# Patient Record
Sex: Male | Born: 1950 | Race: White | Hispanic: No | Marital: Married | Smoking: Former smoker
Health system: Southern US, Community
[De-identification: ages and names within clinical notes are randomized; demographics above are authoritative.]

## PROBLEM LIST (undated history)

## (undated) DIAGNOSIS — I10 Essential (primary) hypertension: Secondary | ICD-10-CM

## (undated) DIAGNOSIS — E785 Hyperlipidemia, unspecified: Secondary | ICD-10-CM

## (undated) DIAGNOSIS — I255 Ischemic cardiomyopathy: Secondary | ICD-10-CM

## (undated) DIAGNOSIS — F419 Anxiety disorder, unspecified: Secondary | ICD-10-CM

## (undated) DIAGNOSIS — M1990 Unspecified osteoarthritis, unspecified site: Secondary | ICD-10-CM

## (undated) DIAGNOSIS — I480 Paroxysmal atrial fibrillation: Secondary | ICD-10-CM

## (undated) DIAGNOSIS — I2510 Atherosclerotic heart disease of native coronary artery without angina pectoris: Secondary | ICD-10-CM

## (undated) DIAGNOSIS — I472 Ventricular tachycardia, unspecified: Secondary | ICD-10-CM

## (undated) DIAGNOSIS — I4510 Unspecified right bundle-branch block: Secondary | ICD-10-CM

## (undated) DIAGNOSIS — I340 Nonrheumatic mitral (valve) insufficiency: Secondary | ICD-10-CM

## (undated) DIAGNOSIS — E46 Unspecified protein-calorie malnutrition: Secondary | ICD-10-CM

## (undated) DIAGNOSIS — S129XXA Fracture of neck, unspecified, initial encounter: Secondary | ICD-10-CM

## (undated) HISTORY — DX: Paroxysmal atrial fibrillation: I48.0

## (undated) HISTORY — DX: Unspecified right bundle-branch block: I45.10

---

## 1998-11-07 DIAGNOSIS — S129XXA Fracture of neck, unspecified, initial encounter: Secondary | ICD-10-CM

## 1998-11-07 HISTORY — PX: CERVICAL FUSION: SHX112

## 1998-11-07 HISTORY — DX: Fracture of neck, unspecified, initial encounter: S12.9XXA

## 1999-05-30 ENCOUNTER — Encounter: Payer: Self-pay | Admitting: Emergency Medicine

## 1999-05-30 ENCOUNTER — Inpatient Hospital Stay (HOSPITAL_COMMUNITY): Admission: EM | Admit: 1999-05-30 | Discharge: 1999-06-01 | Payer: Self-pay

## 1999-05-31 ENCOUNTER — Encounter: Payer: Self-pay | Admitting: General Surgery

## 1999-05-31 ENCOUNTER — Encounter: Payer: Self-pay | Admitting: Surgery

## 1999-06-07 ENCOUNTER — Ambulatory Visit (HOSPITAL_COMMUNITY): Admission: RE | Admit: 1999-06-07 | Discharge: 1999-06-07 | Payer: Self-pay

## 1999-06-11 ENCOUNTER — Emergency Department (HOSPITAL_COMMUNITY): Admission: EM | Admit: 1999-06-11 | Discharge: 1999-06-11 | Payer: Self-pay | Admitting: Emergency Medicine

## 1999-07-04 ENCOUNTER — Ambulatory Visit (HOSPITAL_COMMUNITY): Admission: RE | Admit: 1999-07-04 | Discharge: 1999-07-04 | Payer: Self-pay | Admitting: *Deleted

## 1999-07-04 ENCOUNTER — Encounter: Payer: Self-pay | Admitting: *Deleted

## 1999-07-16 ENCOUNTER — Encounter: Payer: Self-pay | Admitting: Neurological Surgery

## 1999-07-20 ENCOUNTER — Inpatient Hospital Stay (HOSPITAL_COMMUNITY): Admission: RE | Admit: 1999-07-20 | Discharge: 1999-07-21 | Payer: Self-pay | Admitting: Neurological Surgery

## 1999-07-20 ENCOUNTER — Encounter: Payer: Self-pay | Admitting: Neurological Surgery

## 1999-08-25 ENCOUNTER — Encounter: Payer: Self-pay | Admitting: Neurological Surgery

## 1999-08-25 ENCOUNTER — Encounter: Admission: RE | Admit: 1999-08-25 | Discharge: 1999-08-25 | Payer: Self-pay | Admitting: Neurological Surgery

## 1999-09-24 ENCOUNTER — Encounter: Admission: RE | Admit: 1999-09-24 | Discharge: 1999-09-24 | Payer: Self-pay | Admitting: Neurological Surgery

## 1999-09-24 ENCOUNTER — Encounter: Payer: Self-pay | Admitting: Neurological Surgery

## 2008-04-07 ENCOUNTER — Encounter (INDEPENDENT_AMBULATORY_CARE_PROVIDER_SITE_OTHER): Payer: Self-pay | Admitting: Urology

## 2008-04-07 ENCOUNTER — Ambulatory Visit (HOSPITAL_BASED_OUTPATIENT_CLINIC_OR_DEPARTMENT_OTHER): Admission: RE | Admit: 2008-04-07 | Discharge: 2008-04-07 | Payer: Self-pay | Admitting: Urology

## 2011-03-22 NOTE — Op Note (Signed)
Gary [name], Gary [name]                  ACCOUNT NO.:  [id number]   MEDICAL RECORD NO.:  [id number]          PATIENT TYPE:  AMB   LOCATION:  [facility]                         FACILITY:  [facility]   PHYSICIAN:  Valetta Fuller, M.D.  DATE OF BIRTH:  [birth year]   DATE OF PROCEDURE:  04/07/2008  DATE OF DISCHARGE:                               OPERATIVE REPORT   PREOPERATIVE DIAGNOSIS:  Elevated PSA.   POSTOPERATIVE DIAGNOSIS:  Elevated PSA.   PROCEDURE PERFORMED:  Transrectal ultrasound of the prostate with  ultrasound-guided biopsy.   SURGEON:  Valetta Fuller, M.D.   ANESTHESIA:  MAC.   INDICATIONS:  [name] was sent to me recently because of elevated PSA  of 7.7.  He is 60 years of age.  Apparently a PSA last year was within  normal limits.  On clinical exam, the patient was felt to have a normal  prostate based on digital rectal exam, which was 2+ in size.  There was  no evidence of prostatitis or other obvious explanation for his elevated  PSA.  We strongly recommended consideration for ultrasound and biopsy  given his age and change in his PSA.  The patient initially refused the  procedure.  We discussed things with  him further and he did agree to  have the procedure done if it could be done with additional sedation.  The patient appears to understand the rationale for this, the  advantages, disadvantages and potential complications.  He has done the  preparatory steps, has taken oral antibiotics and has now received  perioperative gentamicin.   TECHNIQUE AND FINDINGS:  The patient was brought to the operating room.  He was placed in the lateral decubitus position.  IV sedation was  administered by anesthesia services.  Transrectal ultrasound was placed.  Representative sagittal and transverse images of the prostate were  taken.  Prostate volume was estimated at 30 grams.  There were no  obvious hypoechoic areas but there were some intraprostatic  calcifications.  Prostate was  otherwise symmetric and seminal vesicles  were unremarkable.  The periprostatic block was performed utilizing  lidocaine and a spinal needle.  We then did 12 biopsies with 2 biopsies  in each of the 6 quadrants which were sent in 6 separate vials.  This  was well tolerated by the patient.  There are no obvious complications  or problems.  He was brought to recovery room in stable condition.           ______________________________  Valetta Fuller, M.D.  Electronically Signed     DSG/MEDQ  D:  04/07/2008  T:  04/07/2008  Job:  [id number]

## 2011-08-04 LAB — POCT I-STAT 4, (NA,K, GLUC, HGB,HCT)
Glucose, Bld: 103 — ABNORMAL HIGH
HCT: 48
Hemoglobin: 16.3
Operator id: 268271
Potassium: 4.3
Sodium: 141

## 2013-09-07 HISTORY — PX: CORONARY ANGIOPLASTY: SHX604

## 2013-10-05 ENCOUNTER — Inpatient Hospital Stay (HOSPITAL_COMMUNITY)
Admission: EM | Admit: 2013-10-05 | Discharge: 2013-10-08 | DRG: 250 | Disposition: A | Discharge status: Home / Self Care | Payer: BC Managed Care – PPO | Attending: Cardiology | Admitting: Cardiology

## 2013-10-05 ENCOUNTER — Inpatient Hospital Stay (HOSPITAL_COMMUNITY): Payer: BC Managed Care – PPO

## 2013-10-05 ENCOUNTER — Encounter (HOSPITAL_COMMUNITY): Payer: Self-pay | Admitting: Emergency Medicine

## 2013-10-05 DIAGNOSIS — E876 Hypokalemia: Secondary | ICD-10-CM | POA: Diagnosis not present

## 2013-10-05 DIAGNOSIS — I214 Non-ST elevation (NSTEMI) myocardial infarction: Secondary | ICD-10-CM

## 2013-10-05 DIAGNOSIS — Z681 Body mass index (BMI) 19 or less, adult: Secondary | ICD-10-CM

## 2013-10-05 DIAGNOSIS — F411 Generalized anxiety disorder: Secondary | ICD-10-CM | POA: Diagnosis present

## 2013-10-05 DIAGNOSIS — I059 Rheumatic mitral valve disease, unspecified: Secondary | ICD-10-CM | POA: Diagnosis present

## 2013-10-05 DIAGNOSIS — I2542 Coronary artery dissection: Secondary | ICD-10-CM | POA: Diagnosis present

## 2013-10-05 DIAGNOSIS — I2510 Atherosclerotic heart disease of native coronary artery without angina pectoris: Secondary | ICD-10-CM | POA: Diagnosis present

## 2013-10-05 DIAGNOSIS — R079 Chest pain, unspecified: Secondary | ICD-10-CM

## 2013-10-05 DIAGNOSIS — I472 Ventricular tachycardia, unspecified: Secondary | ICD-10-CM | POA: Diagnosis present

## 2013-10-05 DIAGNOSIS — I2589 Other forms of chronic ischemic heart disease: Secondary | ICD-10-CM | POA: Diagnosis present

## 2013-10-05 DIAGNOSIS — Z23 Encounter for immunization: Secondary | ICD-10-CM

## 2013-10-05 DIAGNOSIS — Z9861 Coronary angioplasty status: Secondary | ICD-10-CM

## 2013-10-05 DIAGNOSIS — E785 Hyperlipidemia, unspecified: Secondary | ICD-10-CM | POA: Diagnosis present

## 2013-10-05 DIAGNOSIS — F17200 Nicotine dependence, unspecified, uncomplicated: Secondary | ICD-10-CM | POA: Diagnosis present

## 2013-10-05 DIAGNOSIS — I10 Essential (primary) hypertension: Secondary | ICD-10-CM | POA: Diagnosis present

## 2013-10-05 DIAGNOSIS — I4729 Other ventricular tachycardia: Secondary | ICD-10-CM | POA: Diagnosis present

## 2013-10-05 DIAGNOSIS — E46 Unspecified protein-calorie malnutrition: Secondary | ICD-10-CM | POA: Diagnosis present

## 2013-10-05 DIAGNOSIS — I252 Old myocardial infarction: Secondary | ICD-10-CM | POA: Diagnosis present

## 2013-10-05 DIAGNOSIS — E43 Unspecified severe protein-calorie malnutrition: Secondary | ICD-10-CM | POA: Insufficient documentation

## 2013-10-05 HISTORY — DX: Anxiety disorder, unspecified: F41.9

## 2013-10-05 HISTORY — DX: Ventricular tachycardia: I47.2

## 2013-10-05 HISTORY — DX: Ischemic cardiomyopathy: I25.5

## 2013-10-05 HISTORY — DX: Ventricular tachycardia, unspecified: I47.20

## 2013-10-05 HISTORY — DX: Fracture of neck, unspecified, initial encounter: S12.9XXA

## 2013-10-05 HISTORY — DX: Hyperlipidemia, unspecified: E78.5

## 2013-10-05 HISTORY — DX: Unspecified protein-calorie malnutrition: E46

## 2013-10-05 HISTORY — DX: Nonrheumatic mitral (valve) insufficiency: I34.0

## 2013-10-05 HISTORY — DX: Atherosclerotic heart disease of native coronary artery without angina pectoris: I25.10

## 2013-10-05 HISTORY — DX: Essential (primary) hypertension: I10

## 2013-10-05 LAB — COMPREHENSIVE METABOLIC PANEL
ALT: 19 U/L (ref 0–53)
Alkaline Phosphatase: 73 U/L (ref 39–117)
BUN: 15 mg/dL (ref 6–23)
CO2: 27 mEq/L (ref 19–32)
Calcium: 9.2 mg/dL (ref 8.4–10.5)
Chloride: 103 mEq/L (ref 96–112)
GFR calc Af Amer: >90 mL/min (ref >90)
GFR calc non Af Amer: >90 mL/min (ref >90)
Glucose, Bld: 114 mg/dL — ABNORMAL HIGH (ref 70–99)
Potassium: 3.1 mEq/L — ABNORMAL LOW (ref 3.5–5.1)
Sodium: 140 mEq/L (ref 135–145)
Total Bilirubin: 0.3 mg/dL (ref 0.3–1.2)

## 2013-10-05 LAB — CBC
HCT: 41.7 % (ref 39.0–52.0)
Hemoglobin: 14.3 g/dL (ref 13.0–17.0)
Platelets: 224 10*3/uL (ref 150–400)
RDW: 14 % (ref 11.5–15.5)
WBC: 13.4 10*3/uL — ABNORMAL HIGH (ref 4.0–10.5)

## 2013-10-05 LAB — BASIC METABOLIC PANEL
CO2: 26 mEq/L (ref 19–32)
Chloride: 102 mEq/L (ref 96–112)
Creatinine, Ser: 0.73 mg/dL (ref 0.50–1.35)
GFR calc Af Amer: >90 mL/min (ref >90)
Potassium: 3.2 mEq/L — ABNORMAL LOW (ref 3.5–5.1)
Sodium: 140 mEq/L (ref 135–145)

## 2013-10-05 LAB — POCT I-STAT TROPONIN I: Troponin i, poc: 6.09 ng/mL (ref 0.00–0.08)

## 2013-10-05 MED ORDER — PROMETHAZINE HCL 25 MG PO TABS: 12.5000 mg | ORAL_TABLET | Freq: Four times a day (QID) | ORAL | Status: DC | PRN

## 2013-10-05 MED ORDER — ONDANSETRON HCL 4 MG/2ML IJ SOLN: 4.0000 mg | Freq: Four times a day (QID) | INTRAMUSCULAR | Status: DC | PRN

## 2013-10-05 MED ORDER — ASPIRIN EC 325 MG PO TBEC
325.0000 mg | DELAYED_RELEASE_TABLET | Freq: Once | ORAL | Status: DC
  Filled 2013-10-05: qty 1

## 2013-10-05 MED ORDER — HEPARIN BOLUS VIA INFUSION
3000.0000 [IU] | Freq: Once | INTRAVENOUS | Status: AC
  Administered 2013-10-05: 3000 [IU] via INTRAVENOUS
  Filled 2013-10-05: qty 3000

## 2013-10-05 MED ORDER — LORAZEPAM 2 MG/ML IJ SOLN
1.0000 mg | Freq: Once | INTRAMUSCULAR | Status: AC
  Administered 2013-10-05: 1 mg via INTRAVENOUS
  Filled 2013-10-05: qty 1

## 2013-10-05 MED ORDER — PNEUMOCOCCAL VAC POLYVALENT 25 MCG/0.5ML IJ INJ
0.5000 mL | INJECTION | INTRAMUSCULAR | Status: DC
  Filled 2013-10-05: qty 0.5

## 2013-10-05 MED ORDER — AMIODARONE IV BOLUS ONLY 150 MG/100ML
150.0000 mg | Freq: Once | INTRAVENOUS | Status: DC
  Filled 2013-10-05: qty 100

## 2013-10-05 MED ORDER — EZETIMIBE 10 MG PO TABS
10.0000 mg | ORAL_TABLET | Freq: Every day | ORAL | Status: DC
  Administered 2013-10-05 – 2013-10-08 (×4): 10 mg via ORAL
  Filled 2013-10-05 (×4): qty 1

## 2013-10-05 MED ORDER — AMLODIPINE-OLMESARTAN 10-20 MG PO TABS: 1.0000 | ORAL_TABLET | Freq: Every day | ORAL | Status: DC

## 2013-10-05 MED ORDER — PROMETHAZINE HCL 25 MG RE SUPP: 12.5000 mg | Freq: Four times a day (QID) | RECTAL | Status: DC | PRN

## 2013-10-05 MED ORDER — DEXTROSE 5 % IV SOLN
60.0000 mg/h | Freq: Once | INTRAVENOUS | Status: DC
  Filled 2013-10-05: qty 9

## 2013-10-05 MED ORDER — AMIODARONE HCL IN DEXTROSE 360-4.14 MG/200ML-% IV SOLN
60.0000 mg/h | INTRAVENOUS | Status: DC
  Administered 2013-10-05: 60 mg/h via INTRAVENOUS
  Administered 2013-10-06 (×2): 30 mg/h via INTRAVENOUS
  Filled 2013-10-05 (×10): qty 200

## 2013-10-05 MED ORDER — ACETAMINOPHEN 325 MG PO TABS: 650.0000 mg | ORAL_TABLET | Freq: Four times a day (QID) | ORAL | Status: DC | PRN

## 2013-10-05 MED ORDER — PROMETHAZINE HCL 25 MG/ML IJ SOLN: 12.5000 mg | Freq: Four times a day (QID) | INTRAMUSCULAR | Status: DC | PRN

## 2013-10-05 MED ORDER — HEPARIN SODIUM (PORCINE) 5000 UNIT/ML IJ SOLN: 60.0000 [IU]/kg | Freq: Once | INTRAMUSCULAR | Status: DC

## 2013-10-05 MED ORDER — MORPHINE SULFATE 2 MG/ML IJ SOLN: 2.0000 mg | INTRAMUSCULAR | Status: DC | PRN

## 2013-10-05 MED ORDER — IRBESARTAN 150 MG PO TABS
150.0000 mg | ORAL_TABLET | Freq: Every day | ORAL | Status: DC
  Administered 2013-10-05 – 2013-10-07 (×3): 150 mg via ORAL
  Filled 2013-10-05 (×5): qty 1

## 2013-10-05 MED ORDER — ACETAMINOPHEN 500 MG PO TABS: 500.0000 mg | ORAL_TABLET | Freq: Four times a day (QID) | ORAL | Status: DC | PRN

## 2013-10-05 MED ORDER — ALPRAZOLAM 0.25 MG PO TABS
0.2500 mg | ORAL_TABLET | Freq: Every day | ORAL | Status: DC | PRN
  Administered 2013-10-06: 0.25 mg via ORAL
  Filled 2013-10-05: qty 1

## 2013-10-05 MED ORDER — ASPIRIN EC 81 MG PO TBEC
81.0000 mg | DELAYED_RELEASE_TABLET | Freq: Every day | ORAL | Status: DC
  Administered 2013-10-06: 81 mg via ORAL
  Filled 2013-10-05: qty 1

## 2013-10-05 MED ORDER — ACETAMINOPHEN 325 MG PO TABS: 650.0000 mg | ORAL_TABLET | ORAL | Status: DC | PRN

## 2013-10-05 MED ORDER — METOPROLOL TARTRATE 12.5 MG HALF TABLET
12.5000 mg | ORAL_TABLET | Freq: Two times a day (BID) | ORAL | Status: DC
  Administered 2013-10-05 – 2013-10-08 (×6): 12.5 mg via ORAL
  Filled 2013-10-05 (×8): qty 1

## 2013-10-05 MED ORDER — HEPARIN (PORCINE) IN NACL 100-0.45 UNIT/ML-% IJ SOLN
850.0000 [IU]/h | INTRAMUSCULAR | Status: DC
  Administered 2013-10-05: 700 [IU]/h via INTRAVENOUS
  Administered 2013-10-06 (×2): 850 [IU]/h via INTRAVENOUS
  Filled 2013-10-05 (×3): qty 250

## 2013-10-05 MED ORDER — ASPIRIN 81 MG PO CHEW
324.0000 mg | CHEWABLE_TABLET | Freq: Once | ORAL | Status: AC
  Administered 2013-10-05: 324 mg via ORAL
  Filled 2013-10-05: qty 4

## 2013-10-05 MED ORDER — NITROGLYCERIN 0.4 MG SL SUBL: 0.4000 mg | SUBLINGUAL_TABLET | SUBLINGUAL | Status: DC | PRN

## 2013-10-05 MED ORDER — AMLODIPINE BESYLATE 10 MG PO TABS
10.0000 mg | ORAL_TABLET | Freq: Every day | ORAL | Status: DC
  Administered 2013-10-05: 10 mg via ORAL
  Filled 2013-10-05 (×2): qty 1

## 2013-10-05 MED ORDER — AMIODARONE IV BOLUS ONLY 150 MG/100ML
150.0000 mg | Freq: Once | INTRAVENOUS | Status: AC
  Administered 2013-10-05: 150 mg via INTRAVENOUS

## 2013-10-05 NOTE — Progress Notes (Signed)
  Amiodarone Drug - Drug Interaction Consult Note  Recommendations: No major drug interactions identified.  Will continue to monitor. Amiodarone is metabolized by the cytochrome P450 system and therefore has the potential to cause many drug interactions. Amiodarone has an average plasma half-life of 50 days (range 20 to 100 days).   There is potential for drug interactions to occur several weeks or months after stopping treatment and the onset of drug interactions may be slow after initiating amiodarone.   []  Statins: Increased risk of myopathy. Simvastatin- restrict dose to 20mg  daily. Other statins: counsel patients to report any muscle pain or weakness immediately.  []  Anticoagulants: Amiodarone can increase anticoagulant effect. Consider warfarin dose reduction. Patients should be monitored closely and the dose of anticoagulant altered accordingly, remembering that amiodarone levels take several weeks to stabilize.  []  Antiepileptics: Amiodarone can increase plasma concentration of phenytoin, the dose should be reduced. Note that small changes in phenytoin dose can result in large changes in levels. Monitor patient and counsel on signs of toxicity.  [x]  Beta blockers: increased risk of bradycardia, AV block and myocardial depression. Sotalol - avoid concomitant use.  []   Calcium channel blockers (diltiazem and verapamil): increased risk of bradycardia, AV block and myocardial depression.  []   Cyclosporine: Amiodarone increases levels of cyclosporine. Reduced dose of cyclosporine is recommended.  []  Digoxin dose should be halved when amiodarone is started.  []  Diuretics: increased risk of cardiotoxicity if hypokalemia occurs.  []  Oral hypoglycemic agents (glyburide, glipizide, glimepiride): increased risk of hypoglycemia. Patient's glucose levels should be monitored closely when initiating amiodarone therapy.   []  Drugs that prolong the QT interval:  Torsades de pointes risk may be  increased with concurrent use - avoid if possible.  Monitor QTc, also keep magnesium/potassium WNL if concurrent therapy can't be avoided. Marland Kitchen Antibiotics: e.g. fluoroquinolones, erythromycin. . Antiarrhythmics: e.g. quinidine, procainamide, disopyramide, sotalol. . Antipsychotics: e.g. phenothiazines, haloperidol.  . Lithium, tricyclic antidepressants, and methadone. Thank You,  Sallee Provencal  10/05/2013 9:36 PM

## 2013-10-05 NOTE — ED Provider Notes (Signed)
I saw and evaluated the patient, reviewed the resident's note and I agree with the findings and plan.  EKG Interpretation    Date/Time:  Saturday October 05 2013 17:13:29 EST Ventricular Rate:  64 PR Interval:  65 QRS Duration: 105 QT Interval:  451 QTC Calculation: 465 R Axis:   -94 Text Interpretation:  Sinus rhythm Short PR interval Right superior axis Abnormal lateral Q waves Anteroseptal infarct, old Borderline ST depression, lateral leads, similar to prior Baseline wander in lead(s) V3 V4 Confirmed by Gwendolyn Grant  MD, Lilliam Chamblee ([id number]) on 10/05/2013 5:48:08 PM            Patient here with chest pain. Atypical, burning sensation, intermittent. Relieved with Pepto, then upon return relieved with ASA. Here with normal initial EKG. While awaiting labs, nursing noted patient was having runs of V-tach. I witnessed a 30 second episode of ventricular tachycardia myself. Patient had pulse entire time and wasn't complaining of anything.  Amiodarone initiated. Patient's troponin returned at 6, heparin initiated, Cards admitting.  CRITICAL CARE Performed by: Dagmar Hait   Total critical care time: 30 minutes  Critical care time was exclusive of separately billable procedures and treating other patients.  Critical care was necessary to treat or prevent imminent or life-threatening deterioration.  Critical care was time spent personally by me on the following activities: development of treatment plan with patient and/or surrogate as well as nursing, discussions with consultants, evaluation of patient's response to treatment, examination of patient, obtaining history from patient or surrogate, ordering and performing treatments and interventions, ordering and review of laboratory studies, ordering and review of radiographic studies, pulse oximetry and re-evaluation of patient's condition.   1. NSTEMI (non-ST elevated myocardial infarction)  2. V-tach.    Dagmar Hait,  MD 10/05/13 [phone]

## 2013-10-05 NOTE — ED Provider Notes (Signed)
CSN: [id number]     Arrival date & time 10/05/13  1710 History   First MD Initiated Contact with Patient 10/05/13 1725     Chief Complaint  Patient presents with  . Chest Pain   (Consider location/radiation/quality/duration/timing/severity/associated sxs/prior Treatment) HPI Comments: 62 year old male chest pain. Some chest pains approximately 11 AM. Patient states his pain was there for several hours. Took Pepto-Bismol in the afternoon with complete resolution of symptoms. Is now symptom-free. Denies any shortness of breath, diaphoresis or previous heart issues.  Patient is a 62 y.o. male presenting with chest pain.  Chest Pain Pain location:  Substernal area Pain quality: burning   Pain radiates to:  Does not radiate Pain radiates to the back: no   Pain severity:  Mild Onset quality:  Unable to specify Timing:  Constant Progression:  Resolved Chronicity:  New Context comment:  None noted Relieved by:  Antacids Worsened by:  Nothing tried Associated symptoms: no abdominal pain, no dizziness, no fatigue, no headache and no shortness of breath   Risk factors: high cholesterol, hypertension and smoking     Past Medical History  Diagnosis Date  . Hypertension   . Dyslipidemia   . Anxiety   . Broken neck    History reviewed. No pertinent past surgical history. History reviewed. No pertinent family history. History  Substance Use Topics  . Smoking status: Current Every Day Smoker    Types: Cigarettes  . Smokeless tobacco: Not on file  . Alcohol Use: No    Review of Systems  Constitutional: Negative for fatigue.  Respiratory: Negative for shortness of breath.   Cardiovascular: Positive for chest pain.  Gastrointestinal: Negative for abdominal pain.  Genitourinary: Negative for difficulty urinating.  Neurological: Negative for dizziness and headaches.  Psychiatric/Behavioral: Negative for agitation.  All other systems reviewed and are negative.    Allergies   Morphine and related and Statins  Home Medications   Current Outpatient Rx  Name  Route  Sig  Dispense  Refill  . acetaminophen (TYLENOL) 500 MG tablet   Oral   Take 500 mg by mouth every 6 (six) hours as needed for moderate pain.         Marland Kitchen ALPRAZolam (XANAX) 0.5 MG tablet   Oral   Take 0.25 mg by mouth daily as needed for anxiety.         Marland Kitchen amlodipine-olmesartan (AZOR) 10-20 MG per tablet   Oral   Take 1 tablet by mouth at bedtime.          BP 124/77  Pulse 72  Temp(Src) 98.9 F (37.2 C) (Oral)  Resp 26  Ht 5\' 6"  (1.676 m)  Wt 112 lb (50.803 kg)  BMI 18.09 kg/m2  SpO2 97% Physical Exam  Nursing note and vitals reviewed. Constitutional: He is oriented to person, place, and time. He appears well-developed and well-nourished.  HENT:  Head: Normocephalic and atraumatic.  Eyes: EOM are normal. Pupils are equal, round, and reactive to light.  Neck: Normal range of motion.  Cardiovascular: Normal rate, regular rhythm and intact distal pulses.   No murmur heard. Pulmonary/Chest: Effort normal and breath sounds normal. No respiratory distress. He exhibits no tenderness.  Abdominal: Soft. He exhibits no distension. There is no tenderness. There is no rebound and no guarding.  Musculoskeletal: Normal range of motion. He exhibits no edema.  Neurological: He is alert and oriented to person, place, and time. No cranial nerve deficit. He exhibits normal muscle tone. Coordination normal.  Skin: Skin is  warm and dry. No rash noted.  Psychiatric: He has a normal mood and affect. His behavior is normal. Judgment and thought content normal.    ED Course  Procedures (including critical care time) Labs Review Labs Reviewed  CBC - Abnormal; Notable for the following:    WBC 13.4 (*)    All other components within normal limits  BASIC METABOLIC PANEL - Abnormal; Notable for the following:    Potassium 3.2 (*)    Glucose, Bld 126 (*)    All other components within normal limits   POCT I-STAT TROPONIN I - Abnormal; Notable for the following:    Troponin i, poc 6.09 (*)    All other components within normal limits  HEPARIN LEVEL (UNFRACTIONATED)  CBC   Imaging Review No results found.  EKG Interpretation    Date/Time:  Saturday October 05 2013 17:13:29 EST Ventricular Rate:  64 PR Interval:  65 QRS Duration: 105 QT Interval:  451 QTC Calculation: 465 R Axis:   -94 Text Interpretation:  Sinus rhythm Short PR interval Right superior axis Abnormal lateral Q waves Anteroseptal infarct, old Borderline ST depression, lateral leads, similar to prior Baseline wander in lead(s) V3 V4 Confirmed by Gwendolyn Grant  MD, BLAIR ([id number]) on 10/05/2013 5:48:08 PM            MDM   1. NSTEMI (non-ST elevated myocardial infarction)     On arrival afebrile vital signs within normal limits. Patient complaining of no chest pain now. Patient with multiple risk factors including hypertension, smoking and hyperlipidemia. No previous CAD. Story initially less concerning for cardiac issues, atypical chest pain relieved with Pepto-Bismol. However given multiple risk factors and age basic labs and EKG obtained. While awaiting labs in ED patient noted to have multiple runs of ventricular tachycardia. These last approximately 30 seconds. Labs significant for troponin 6. CBC and BMP unremarkable. Secondary to runs of ventricular tachycardia patient was started on amiodarone. Received bolus and infusion. Started heparin. Cardiology consult. EKG findings as noted above. Patient will be admitted to cardiology service.  Patient discussed with attending Dr. Gwendolyn Grant.      Bridgett Larsson, MD 10/05/13 [phone]

## 2013-10-05 NOTE — ED Notes (Signed)
Pt arrived by [facility] from [facility] for chest pain. Began having chest pain today after eating something spicy, intermittent chest pain that only lasted 10 mins. Denies sob or n/v.

## 2013-10-05 NOTE — H&P (Signed)
Gary [name] is an 62 y.o. male.    Chief Complaint: chest pain  HPI: He has a PMH of HTN, hyperlipidemia (not on statin due to statin allergies) and tobacco abuse (40 pack years of smoking). He was in his usual state of health until about 11 am on the day of presentation when he started to experience post-prandial chest pain that started about 1 hour after he eating hot pepper.  His pain was described as "hurting and burning", rated as 4/10 in severity and was not associated with nausea/vomiting, diaphoresis, shortness of breath, diaphoresis, palpitation, dizziness, syncope or radiation.  His pain initially lasted about 5-10 minutes before spontaneous relief, but it recurred again which prompted him to seek medical attention.  In ER, his EKG obtained 10/05/2013 at 17:13 showed sinus rhythm with heart rate of 64 bpm, but no ST- or T-wave changes to suggest ischemia. He has been asymptomatic and is clinically stable. While he was been observed in the ER, he suddenly developed recurrent monomorphic non-sustained ventricular tachycardia with heart rate ranging about 120-140 bpm requiring initiation of Amiodarone 150 mg bolus followed by a drip.  He is currently asymptomatic and is clinically and hemodynamically stable.  Past Medical History  Diagnosis Date  . Hypertension   . Dyslipidemia   . Anxiety   . Broken neck     History reviewed. No pertinent past surgical history.  History reviewed. No pertinent family history. Social History:  reports that he has been smoking Cigarettes.  He has been smoking about 0.00 packs per day. He does not have any smokeless tobacco history on file. He reports that he does not drink alcohol or use illicit drugs.  Allergies:  Allergies  Allergen Reactions  . Morphine And Related     Extreme agitation (pulls out IVs)  . Statins     Causes muscle fatigue     (Not in a hospital admission)  Results for orders placed during the hospital encounter of 10/05/13  (from the past 48 hour(s))  CBC     Status: Abnormal   Collection Time    10/05/13  6:08 PM      Result Value Range   WBC 13.4 (*) 4.0 - 10.5 K/uL   RBC 4.43  4.22 - 5.81 MIL/uL   Hemoglobin 14.3  13.0 - 17.0 g/dL   HCT 16.1  09.6 - 04.5 %   MCV 94.1  78.0 - 100.0 fL   MCH 32.3  26.0 - 34.0 pg   MCHC 34.3  30.0 - 36.0 g/dL   RDW 40.9  81.1 - 91.4 %   Platelets 224  150 - 400 K/uL  BASIC METABOLIC PANEL     Status: Abnormal   Collection Time    10/05/13  6:08 PM      Result Value Range   Sodium 140  135 - 145 mEq/L   Potassium 3.2 (*) 3.5 - 5.1 mEq/L   Chloride 102  96 - 112 mEq/L   CO2 26  19 - 32 mEq/L   Glucose, Bld 126 (*) 70 - 99 mg/dL   BUN 17  6 - 23 mg/dL   Creatinine, Ser 7.82  0.50 - 1.35 mg/dL   Calcium 9.2  8.4 - 95.6 mg/dL   GFR calc non Af Amer >90  >90 mL/min   GFR calc Af Amer >90  >90 mL/min   Comment: (NOTE)     The eGFR has been calculated using the CKD EPI equation.  This calculation has not been validated in all clinical situations.     eGFR's persistently <90 mL/min signify possible Chronic Kidney     Disease.  POCT I-STAT TROPONIN I     Status: Abnormal   Collection Time    10/05/13  6:30 PM      Result Value Range   Troponin i, poc 6.09 (*) 0.00 - 0.08 ng/mL   Comment NOTIFIED PHYSICIAN     Comment 3            Comment: Due to the release kinetics of cTnI,     a negative result within the first hours     of the onset of symptoms does not rule out     myocardial infarction with certainty.     If myocardial infarction is still suspected,     repeat the test at appropriate intervals.   No results found.  Review of Systems  Constitutional: Negative for fever, chills, weight loss, malaise/fatigue and diaphoresis.  HENT: Negative for congestion, ear discharge, ear pain, hearing loss, nosebleeds, sore throat and tinnitus.   Eyes: Negative for blurred vision, double vision, photophobia, pain and discharge.  Respiratory: Negative for cough,  hemoptysis, sputum production, shortness of breath, wheezing and stridor.   Cardiovascular: Positive for chest pain. Negative for palpitations, orthopnea, claudication, leg swelling and PND.  Gastrointestinal: Negative for heartburn, nausea, vomiting, abdominal pain, diarrhea, constipation, blood in stool and melena.  Genitourinary: Negative for dysuria, urgency, frequency, hematuria and flank pain.  Musculoskeletal: Negative for back pain, joint pain, myalgias and neck pain.  Skin: Negative for itching and rash.  Neurological: Negative for dizziness, tingling, tremors, sensory change, speech change, focal weakness, seizures, loss of consciousness, weakness and headaches.  Endo/Heme/Allergies: Negative for environmental allergies and polydipsia. Does not bruise/bleed easily.  Psychiatric/Behavioral: Negative for depression and suicidal ideas.    Blood pressure 124/77, pulse 72, temperature 98.9 F (37.2 C), temperature source Oral, resp. rate 26, height 5\' 6"  (1.676 m), weight 50.803 kg (112 lb), SpO2 97.00%. Physical Exam  Constitutional: He is oriented to person, place, and time. He appears well-developed and well-nourished. No distress.  HENT:  Head: Normocephalic and atraumatic.  Eyes: EOM are normal. Pupils are equal, round, and reactive to light. Right eye exhibits no discharge. Left eye exhibits no discharge. No scleral icterus.  Neck: Normal range of motion. Neck supple. No JVD present. No tracheal deviation present.  Cardiovascular: Normal rate, regular rhythm and normal heart sounds.  Exam reveals no gallop and no friction rub.   No murmur heard. Respiratory: Effort normal and breath sounds normal. No stridor. No respiratory distress. He has no wheezes. He has no rales. He exhibits no tenderness.  GI: Soft. Bowel sounds are normal. He exhibits no distension. There is no tenderness. There is no rebound and no guarding.  Musculoskeletal: He exhibits no edema and no tenderness.   Neurological: He is alert and oriented to person, place, and time. No cranial nerve deficit. Coordination normal.  Skin: No rash noted. He is not diaphoretic. No erythema.  Psychiatric: He has a normal mood and affect.     Assessment/Plan  NSTEMI Recurrent Non-sustained ventricular tachycardia (likely secondary to NSTEMI) HTN Hyperlipidemia Tobacco abuse  We will admit the patient to cardiology to be observed on telemetry. We will obtain serial cardiac markers and start the patient on Aspirin 81 mg qd, Heparin drip (per pharmacy consult), Metoprolol 12.5 mg bid to be titrated up, Ezetimibe 10 mg qd (since patient reports allergies  to all statins) and Amiodarone drip since he is having recurrent non-sustained VT that is likely secondary to NSTEMI.  If he has recurrent chest pain, we will treat him with Nitrates/Morphine.  We will obtain TTE to evaluate his LV function. We will keep him NPO on Sunday night for cardiac catheterization on Monday.  Smoking cessation advised.  Theresia Pree E 10/05/2013, 7:43 PM

## 2013-10-05 NOTE — ED Notes (Signed)
Critical lab results reported to Dr.Waldon.

## 2013-10-05 NOTE — Progress Notes (Signed)
ANTICOAGULATION CONSULT NOTE - Initial Consult  Pharmacy Consult for Heparin Indication: chest pain/ACS  Allergies  Allergen Reactions  . Statins     Causes muscle fatigue    Patient Measurements:   Heparin Dosing Weight: 50.8 kg   Vital Signs: Temp: 98.9 F (37.2 C) (11/29 1714) Temp src: Oral (11/29 1714) BP: 124/77 mmHg (11/29 1800) Pulse Rate: 72 (11/29 1800)  Labs: No results found for this basename: HGB, HCT, PLT, APTT, LABPROT, INR, HEPARINUNFRC, CREATININE, CKTOTAL, CKMB, TROPONINI,  in the last 72 hours  CrCl is unknown because no creatinine reading has been taken and the patient has no height on file.   Medical History: Past Medical History  Diagnosis Date  . Hypertension   . Dyslipidemia   . Anxiety   . Broken neck     Medications:  Tylenol, Xanax, Amlodipine/Olmesartan  Assessment: 62 y/o M who developed CP after eating spicy food. Resolved with Pepto-Bismol. Troponin 6.08. Hgb 14.3 an Plts 224 WNL. No bleeding per patient.  PMH: HTN, HLD, anxiety, broken neck, tobacco.  Goal of Therapy:  Heparin level 0.3-0.7 units/ml Monitor platelets by anticoagulation protocol: Yes   Plan:   Heparin 3000 unit IV bolus Heparin infusion 700 units/hr Check heparin level in 6-8 hrs and daily.   Anahy Esh S. Merilynn Finland, PharmD, BCPS Clinical Staff Pharmacist Pager [phone]  Misty Stanley Stillinger 10/05/2013,6:47 PM

## 2013-10-06 DIAGNOSIS — I214 Non-ST elevation (NSTEMI) myocardial infarction: Secondary | ICD-10-CM

## 2013-10-06 DIAGNOSIS — I059 Rheumatic mitral valve disease, unspecified: Secondary | ICD-10-CM

## 2013-10-06 LAB — TROPONIN I
Troponin I: >20 ng/mL (ref <0.30)
Troponin I: >20 ng/mL (ref <0.30)

## 2013-10-06 LAB — BASIC METABOLIC PANEL
BUN: 13 mg/dL (ref 6–23)
CO2: 27 mEq/L (ref 19–32)
Chloride: 104 mEq/L (ref 96–112)
Creatinine, Ser: 0.7 mg/dL (ref 0.50–1.35)
Glucose, Bld: 108 mg/dL — ABNORMAL HIGH (ref 70–99)
Potassium: 3.8 mEq/L (ref 3.5–5.1)

## 2013-10-06 LAB — HEPARIN LEVEL (UNFRACTIONATED)
Heparin Unfractionated: 0.25 IU/mL — ABNORMAL LOW (ref 0.30–0.70)
Heparin Unfractionated: 0.36 IU/mL (ref 0.30–0.70)

## 2013-10-06 LAB — CBC
HCT: 42.9 % (ref 39.0–52.0)
Hemoglobin: 14.6 g/dL (ref 13.0–17.0)
MCHC: 34 g/dL (ref 30.0–36.0)
Platelets: 216 10*3/uL (ref 150–400)
RDW: 14 % (ref 11.5–15.5)

## 2013-10-06 LAB — APTT: aPTT: 77 seconds — ABNORMAL HIGH (ref 24–37)

## 2013-10-06 LAB — PROTIME-INR
INR: 1.07 (ref 0.00–1.49)
Prothrombin Time: 13.7 seconds (ref 11.6–15.2)

## 2013-10-06 LAB — MRSA PCR SCREENING: MRSA by PCR: NEGATIVE

## 2013-10-06 LAB — LIPID PANEL
Total CHOL/HDL Ratio: 6.1 RATIO
VLDL: 17 mg/dL (ref 0–40)

## 2013-10-06 MED ORDER — ASPIRIN 81 MG PO CHEW
324.0000 mg | CHEWABLE_TABLET | Freq: Once | ORAL | Status: AC
  Administered 2013-10-07: 324 mg via ORAL
  Filled 2013-10-06: qty 4

## 2013-10-06 MED ORDER — POTASSIUM CHLORIDE 10 MEQ/100ML IV SOLN
10.0000 meq | INTRAVENOUS | Status: AC
  Administered 2013-10-06 (×2): 10 meq via INTRAVENOUS
  Filled 2013-10-06 (×2): qty 100

## 2013-10-06 MED ORDER — ALPRAZOLAM 0.5 MG PO TABS
0.5000 mg | ORAL_TABLET | Freq: Two times a day (BID) | ORAL | Status: DC | PRN
  Administered 2013-10-06 (×2): 0.5 mg via ORAL
  Filled 2013-10-06 (×2): qty 1

## 2013-10-06 MED ORDER — SODIUM CHLORIDE 0.9 % IJ SOLN: 3.0000 mL | Freq: Two times a day (BID) | INTRAMUSCULAR | Status: DC

## 2013-10-06 MED ORDER — SODIUM CHLORIDE 0.9 % IV SOLN
1.0000 mL/kg/h | INTRAVENOUS | Status: DC
  Administered 2013-10-06: 1 mL/kg/h via INTRAVENOUS

## 2013-10-06 MED ORDER — DIAZEPAM 2 MG PO TABS: 2.0000 mg | ORAL_TABLET | ORAL | Status: DC

## 2013-10-06 MED ORDER — ASPIRIN 81 MG PO CHEW: 81.0000 mg | CHEWABLE_TABLET | ORAL | Status: DC

## 2013-10-06 MED ORDER — SODIUM CHLORIDE 0.9 % IJ SOLN
3.0000 mL | Freq: Two times a day (BID) | INTRAMUSCULAR | Status: DC
  Administered 2013-10-06: 3 mL via INTRAVENOUS

## 2013-10-06 MED ORDER — SODIUM CHLORIDE 0.9 % IV SOLN
1.0000 mL/kg/h | INTRAVENOUS | Status: DC
  Administered 2013-10-07: 1 mL/kg/h via INTRAVENOUS

## 2013-10-06 MED ORDER — ASPIRIN EC 81 MG PO TBEC
81.0000 mg | DELAYED_RELEASE_TABLET | Freq: Every day | ORAL | Status: DC
  Administered 2013-10-08: 81 mg via ORAL
  Filled 2013-10-06: qty 1

## 2013-10-06 MED ORDER — SODIUM CHLORIDE 0.9 % IJ SOLN: 3.0000 mL | INTRAMUSCULAR | Status: DC | PRN

## 2013-10-06 MED ORDER — SODIUM CHLORIDE 0.9 % IV SOLN: 250.0000 mL | INTRAVENOUS | Status: DC | PRN

## 2013-10-06 MED ORDER — DIAZEPAM 5 MG PO TABS
5.0000 mg | ORAL_TABLET | ORAL | Status: AC
  Administered 2013-10-07: 5 mg via ORAL
  Filled 2013-10-06: qty 1

## 2013-10-06 NOTE — Progress Notes (Signed)
ANTICOAGULATION CONSULT NOTE  Pharmacy Consult for Heparin Indication: chest pain/ACS  Allergies  Allergen Reactions  . Morphine And Related     Extreme agitation (pulls out IVs)  . Statins     Causes muscle fatigue    Patient Measurements: Height: 5\' 6"  (167.6 cm) Weight: 106 lb 11.2 oz (48.4 kg) IBW/kg (Calculated) : 63.8  Vital Signs: Temp: 98.7 F (37.1 C) (11/30 0545) Temp src: Oral (11/30 0545) BP: 137/79 mmHg (11/30 0545) Pulse Rate: 68 (11/30 0545)  Labs:  Recent Labs  10/05/13 1808 10/05/13 2300 10/06/13 0525  HGB 14.3  --  14.6  HCT 41.7  --  42.9  PLT 224  --  216  HEPARINUNFRC  --   --  0.25*  CREATININE 0.73 0.65  --   TROPONINI  --  >20.00*  --     Estimated Creatinine Clearance: 65.5 ml/min (by C-G formula based on Cr of 0.65).  Assessment: 62 y.o. male with chest pain for heparin   Goal of Therapy:  Heparin level 0.3-0.7 units/ml Monitor platelets by anticoagulation protocol: Yes   Plan:  Increase Heparin 850 units/hr Check heparin level in 6 hours.  Nikos Anglemyer, Gary Fleet 10/06/2013,6:11 AM

## 2013-10-06 NOTE — Progress Notes (Signed)
Utilization Review Completed.Gary [name] T11/30/2014  

## 2013-10-06 NOTE — Progress Notes (Signed)
Nursing note Patient given Xanax as ordered and requested per patient. Will continue to monitor patient. Angeleigh Chiasson, Randall An  rN

## 2013-10-06 NOTE — Progress Notes (Signed)
CRITICAL VALUE ALERT  Critical value received:  Troponin >20  Date of notification:  10/06/13  Time of notification:  0022  Critical value read back:yes  Nurse who received alert:  Buel Ream RN  MD notified (1st page):  Dr. Katha Cabal  Time of first page:  0032  MD notified (2nd page):  Time of second page:  Responding MD:  Dr. Katha Cabal  Time MD responded:  [phone]

## 2013-10-06 NOTE — Progress Notes (Signed)
Received critical value from the lab that second troponin was >20. Notified MD on call Dr. Katha Cabal. Patient currently on heparin drip and amiodarone drip.  Assessed patient who was sleeping in bed with daughter at the bedside. Patient does not have any chest pain. Patient's BP 131/75 HR 61, O2 97% on Room Air. Patient's potassium was 3.1. Received orders for two runs of potassium. Will continue to monitor.

## 2013-10-06 NOTE — Progress Notes (Signed)
ANTICOAGULATION CONSULT NOTE  Pharmacy Consult for Heparin Indication: chest pain/ACS  Allergies  Allergen Reactions  . Morphine And Related     Extreme agitation (pulls out IVs)  . Statins     Causes muscle fatigue    Patient Measurements: Height: 5\' 6"  (167.6 cm) Weight: 106 lb 11.2 oz (48.4 kg) IBW/kg (Calculated) : 63.8  Vital Signs: Temp: 98.7 F (37.1 C) (11/30 0545) Temp src: Oral (11/30 0545) BP: 143/73 mmHg (11/30 0946) Pulse Rate: 73 (11/30 0946)  Labs:  Recent Labs  10/05/13 1808 10/05/13 2300 10/06/13 0525  HGB 14.3  --  14.6  HCT 41.7  --  42.9  PLT 224  --  216  HEPARINUNFRC  --   --  0.25*  CREATININE 0.73 0.65 0.70  TROPONINI  --  >20.00* >20.00*    Estimated Creatinine Clearance: 65.5 ml/min (by C-G formula based on Cr of 0.7).  Assessment: 62 y.o. male on IV heparin drip for chest pain; non-ST elevation myocardial infarction.  Heparin level is 0.36, therapeutic on current rate 850 units/hr.   MD notes patient denies chest pain or dyspnea this AM.  CBC stable. No bleeding noted. MD notes patient denies chest pain or dyspnea this AM.  Plan for cardiac cath tomorrow.   Goal of Therapy:  Heparin level 0.3-0.7 units/ml Monitor platelets by anticoagulation protocol: Yes   Plan:  Continue IV Heparin 850 units/hr Heparin level and CBC daily in AM.  Noah Delaine, RPh Clinical Pharmacist Pager: [phone] 10/06/2013,1:42 PM

## 2013-10-06 NOTE — Progress Notes (Signed)
Bed assigned for 2H11, report called to RN on 2 heart. Will transport via Bed to 2H. Sameer Teeple, Randall An  rN

## 2013-10-06 NOTE — Progress Notes (Addendum)
    Subjective:  Denies CP or dyspnea   Objective:  Filed Vitals:   10/05/13 2045 10/05/13 2119 10/06/13 0026 10/06/13 0545  BP: 120/70 122/79 131/75 137/79  Pulse: 77 74 61 68  Temp:  98.6 F (37 C)  98.7 F (37.1 C)  TempSrc:  Oral  Oral  Resp: 17 18  18  Height:  5' 6" (1.676 m)    Weight:  106 lb 11.2 oz (48.4 kg)  106 lb 11.2 oz (48.4 kg)  SpO2: 93% 95% 97% 94%    Intake/Output from previous day:  Intake/Output Summary (Last 24 hours) at 10/06/13 0914 Last data filed at 10/06/13 0700  Gross per 24 hour  Intake    190 ml  Output      0 ml  Net    190 ml    Physical Exam: Physical exam: Well-developed well-nourished in no acute distress.  Skin is warm and dry.  HEENT is normal.  Neck is supple.  Chest is clear to auscultation with normal expansion.  Cardiovascular exam is regular rate and rhythm.  Abdominal exam nontender or distended. No masses palpated. Extremities show no edema. neuro grossly intact    Lab Results: Basic Metabolic Panel:  Recent Labs  10/05/13 1808 10/05/13 2300 10/06/13 0525  NA 140 140 139  K 3.2* 3.1* 3.8  CL 102 103 104  CO2 26 27 27  GLUCOSE 126* 114* 108*  BUN 17 15 13  CREATININE 0.73 0.65 0.70  CALCIUM 9.2 9.2 9.2  MG  --  1.9  --    CBC:  Recent Labs  10/05/13 1808 10/06/13 0525  WBC 13.4* 11.4*  HGB 14.3 14.6  HCT 41.7 42.9  MCV 94.1 94.1  PLT 224 216   Cardiac Enzymes:  Recent Labs  10/05/13 2300 10/06/13 0525  TROPONINI >20.00* >20.00*     Assessment/Plan:  1 non-ST elevation myocardial infarction-patient has ruled in for an infarct. Presently pain free. Electrocardiogram showed no ST changes. Plan to continue heparin, aspirin and beta blocker. He has intolerance to statins. Continue zetia. Proceed with cardiac catheterization tomorrow a.m. The risks and benefits were discussed and the patient agrees to proceed. 2 ventricular tachycardia-the patient is presently in sinus rhythm and is on IV  amiodarone. Ventricular arrhythmias most likely related to acute ischemic event. Continue for now. I will transfer to step down for closer monitoring. 3 tobacco abuse-patient counseled on discontinuing. 4 hyperlipidemia-intolerant to statins. Continue zetia 5 hypertension-continue present blood pressure medications other than amlodipine.  Brian Crenshaw 10/06/2013, 9:14 AM    

## 2013-10-07 ENCOUNTER — Encounter (HOSPITAL_COMMUNITY)
Admission: EM | Disposition: A | Discharge status: Home / Self Care | Payer: Self-pay | Source: Home / Self Care | Attending: Cardiology

## 2013-10-07 DIAGNOSIS — I214 Non-ST elevation (NSTEMI) myocardial infarction: Secondary | ICD-10-CM

## 2013-10-07 DIAGNOSIS — I2510 Atherosclerotic heart disease of native coronary artery without angina pectoris: Secondary | ICD-10-CM

## 2013-10-07 HISTORY — PX: LEFT HEART CATHETERIZATION WITH CORONARY ANGIOGRAM: SHX5451

## 2013-10-07 LAB — BASIC METABOLIC PANEL
CO2: 26 mEq/L (ref 19–32)
Calcium: 8.8 mg/dL (ref 8.4–10.5)
Creatinine, Ser: 0.75 mg/dL (ref 0.50–1.35)
GFR calc non Af Amer: >90 mL/min (ref >90)
Glucose, Bld: 105 mg/dL — ABNORMAL HIGH (ref 70–99)

## 2013-10-07 LAB — HEPARIN LEVEL (UNFRACTIONATED): Heparin Unfractionated: 0.44 IU/mL (ref 0.30–0.70)

## 2013-10-07 LAB — GLUCOSE, CAPILLARY: Glucose-Capillary: 113 mg/dL — ABNORMAL HIGH (ref 70–99)

## 2013-10-07 LAB — CBC
Hemoglobin: 14.8 g/dL (ref 13.0–17.0)
MCH: 32.2 pg (ref 26.0–34.0)
MCHC: 34.1 g/dL (ref 30.0–36.0)
MCV: 94.6 fL (ref 78.0–100.0)
Platelets: 206 10*3/uL (ref 150–400)
RBC: 4.59 MIL/uL (ref 4.22–5.81)

## 2013-10-07 LAB — POCT ACTIVATED CLOTTING TIME
Activated Clotting Time: 211 seconds
Activated Clotting Time: 217 seconds
Activated Clotting Time: 283 seconds

## 2013-10-07 SURGERY — LEFT HEART CATHETERIZATION WITH CORONARY ANGIOGRAM
Anesthesia: LOCAL

## 2013-10-07 MED ORDER — HEART ATTACK BOUNCING BOOK
Freq: Once | Status: AC
  Administered 2013-10-07: 17:00:00
  Filled 2013-10-07: qty 1

## 2013-10-07 MED ORDER — NITROGLYCERIN 0.2 MG/ML ON CALL CATH LAB
INTRAVENOUS | Status: AC
  Filled 2013-10-07: qty 1

## 2013-10-07 MED ORDER — SODIUM CHLORIDE 0.9 % IJ SOLN: 3.0000 mL | Freq: Two times a day (BID) | INTRAMUSCULAR | Status: DC

## 2013-10-07 MED ORDER — FENTANYL CITRATE 0.05 MG/ML IJ SOLN
INTRAMUSCULAR | Status: AC
  Filled 2013-10-07: qty 2

## 2013-10-07 MED ORDER — HEPARIN (PORCINE) IN NACL 2-0.9 UNIT/ML-% IJ SOLN
INTRAMUSCULAR | Status: AC
  Filled 2013-10-07: qty 500

## 2013-10-07 MED ORDER — LIDOCAINE HCL (PF) 1 % IJ SOLN
INTRAMUSCULAR | Status: AC
  Filled 2013-10-07: qty 30

## 2013-10-07 MED ORDER — TICAGRELOR 90 MG PO TABS
90.0000 mg | ORAL_TABLET | Freq: Two times a day (BID) | ORAL | Status: DC
  Administered 2013-10-07 – 2013-10-08 (×3): 90 mg via ORAL
  Filled 2013-10-07 (×3): qty 1

## 2013-10-07 MED ORDER — SODIUM CHLORIDE 0.9 % IJ SOLN: 3.0000 mL | INTRAMUSCULAR | Status: DC | PRN

## 2013-10-07 MED ORDER — MIDAZOLAM HCL 2 MG/2ML IJ SOLN
INTRAMUSCULAR | Status: AC
  Filled 2013-10-07: qty 2

## 2013-10-07 MED ORDER — PNEUMOCOCCAL VAC POLYVALENT 25 MCG/0.5ML IJ INJ
0.5000 mL | INJECTION | INTRAMUSCULAR | Status: AC
  Administered 2013-10-08: 0.5 mL via INTRAMUSCULAR
  Filled 2013-10-07: qty 0.5

## 2013-10-07 MED ORDER — VERAPAMIL HCL 2.5 MG/ML IV SOLN
INTRAVENOUS | Status: AC
  Filled 2013-10-07: qty 2

## 2013-10-07 MED ORDER — HEPARIN (PORCINE) IN NACL 2-0.9 UNIT/ML-% IJ SOLN
INTRAMUSCULAR | Status: AC
  Filled 2013-10-07: qty 1500

## 2013-10-07 MED ORDER — TIROFIBAN HCL IV 12.5 MG/250 ML
INTRAVENOUS | Status: AC
  Filled 2013-10-07: qty 250

## 2013-10-07 MED ORDER — TIROFIBAN HCL IV 5 MG/100ML
0.1500 ug/kg/min | INTRAVENOUS | Status: AC
  Administered 2013-10-07: 0.15 ug/kg/min via INTRAVENOUS
  Filled 2013-10-07: qty 100

## 2013-10-07 MED ORDER — TICAGRELOR 90 MG PO TABS
ORAL_TABLET | ORAL | Status: AC
  Filled 2013-10-07: qty 1

## 2013-10-07 MED ORDER — HEPARIN SODIUM (PORCINE) 1000 UNIT/ML IJ SOLN
INTRAMUSCULAR | Status: AC
  Filled 2013-10-07: qty 1

## 2013-10-07 MED ORDER — SODIUM CHLORIDE 0.9 % IV SOLN: 250.0000 mL | INTRAVENOUS | Status: DC | PRN

## 2013-10-07 MED ORDER — SODIUM CHLORIDE 0.9 % IV SOLN
1.0000 mL/kg/h | INTRAVENOUS | Status: AC
  Administered 2013-10-07 (×3): 1 mL/kg/h via INTRAVENOUS

## 2013-10-07 MED ORDER — LORAZEPAM 0.5 MG PO TABS
1.0000 mg | ORAL_TABLET | Freq: Two times a day (BID) | ORAL | Status: DC | PRN
  Administered 2013-10-07 – 2013-10-08 (×2): 1 mg via ORAL
  Filled 2013-10-07 (×2): qty 2

## 2013-10-07 NOTE — H&P (View-Only) (Signed)
    Subjective:  Denies CP or dyspnea   Objective:  Filed Vitals:   10/05/13 2045 10/05/13 2119 10/06/13 0026 10/06/13 0545  BP: 120/70 122/79 131/75 137/79  Pulse: 77 74 61 68  Temp:  98.6 F (37 C)  98.7 F (37.1 C)  TempSrc:  Oral  Oral  Resp: 17 18  18   Height:  5\' 6"  (1.676 m)    Weight:  106 lb 11.2 oz (48.4 kg)  106 lb 11.2 oz (48.4 kg)  SpO2: 93% 95% 97% 94%    Intake/Output from previous day:  Intake/Output Summary (Last 24 hours) at 10/06/13 0914 Last data filed at 10/06/13 0700  Gross per 24 hour  Intake    190 ml  Output      0 ml  Net    190 ml    Physical Exam: Physical exam: Well-developed well-nourished in no acute distress.  Skin is warm and dry.  HEENT is normal.  Neck is supple.  Chest is clear to auscultation with normal expansion.  Cardiovascular exam is regular rate and rhythm.  Abdominal exam nontender or distended. No masses palpated. Extremities show no edema. neuro grossly intact    Lab Results: Basic Metabolic Panel:  Recent Labs  16/10/96 1808 10/05/13 2300 10/06/13 0525  NA 140 140 139  K 3.2* 3.1* 3.8  CL 102 103 104  CO2 26 27 27   GLUCOSE 126* 114* 108*  BUN 17 15 13   CREATININE 0.73 0.65 0.70  CALCIUM 9.2 9.2 9.2  MG  --  1.9  --    CBC:  Recent Labs  10/05/13 1808 10/06/13 0525  WBC 13.4* 11.4*  HGB 14.3 14.6  HCT 41.7 42.9  MCV 94.1 94.1  PLT 224 216   Cardiac Enzymes:  Recent Labs  10/05/13 2300 10/06/13 0525  TROPONINI >20.00* >20.00*     Assessment/Plan:  1 non-ST elevation myocardial infarction-patient has ruled in for an infarct. Presently pain free. Electrocardiogram showed no ST changes. Plan to continue heparin, aspirin and beta blocker. He has intolerance to statins. Continue zetia. Proceed with cardiac catheterization tomorrow a.m. The risks and benefits were discussed and the patient agrees to proceed. 2 ventricular tachycardia-the patient is presently in sinus rhythm and is on IV  amiodarone. Ventricular arrhythmias most likely related to acute ischemic event. Continue for now. I will transfer to step down for closer monitoring. 3 tobacco abuse-patient counseled on discontinuing. 4 hyperlipidemia-intolerant to statins. Continue zetia 5 hypertension-continue present blood pressure medications other than amlodipine.  Olga Millers 10/06/2013, 9:14 AM

## 2013-10-07 NOTE — Progress Notes (Signed)
ANTICOAGULATION CONSULT NOTE - Initial Consult  Pharmacy Consult for aggrastat Indication: NSTEMI; post PCI  Allergies  Allergen Reactions  . Morphine And Related     Extreme agitation (pulls out IVs)  . Statins     Causes muscle fatigue    Patient Measurements: Height: 5\' 6"  (167.6 cm) Weight: 109 lb 9.1 oz (49.7 kg) IBW/kg (Calculated) : 63.8   Vital Signs: Temp: 97.7 F (36.5 C) (12/01 1017) Temp src: Oral (12/01 1017) BP: 149/81 mmHg (12/01 1017) Pulse Rate: 57 (12/01 1017)  Labs:  Recent Labs  10/05/13 1808 10/05/13 2300 10/06/13 0525 10/06/13 1250 10/06/13 1745 10/07/13 0400  HGB 14.3  --  14.6  --   --  14.8  HCT 41.7  --  42.9  --   --  43.4  PLT 224  --  216  --   --  206  APTT  --   --   --   --  77*  --   LABPROT  --   --   --   --  13.7  --   INR  --   --   --   --  1.07  --   HEPARINUNFRC  --   --  0.25* 0.36  --  0.44  CREATININE 0.73 0.65 0.70  --   --  0.75  TROPONINI  --  >20.00* >20.00* >20.00*  --   --     Estimated Creatinine Clearance: 67.3 ml/min (by C-G formula based on Cr of 0.75).   Medical History: Past Medical History  Diagnosis Date  . Hypertension   . Dyslipidemia   . Anxiety   . Broken neck     Medications:  Scheduled:  . [START ON 10/08/2013] aspirin EC  81 mg Oral Daily  . ezetimibe  10 mg Oral Daily  . irbesartan  150 mg Oral QHS  . metoprolol tartrate  12.5 mg Oral BID  . [START ON 10/08/2013] pneumococcal 23 valent vaccine  0.5 mL Intramuscular Tomorrow-1000  . Ticagrelor  90 mg Oral BID   Infusions:  . sodium chloride 1 mL/kg/hr (10/07/13 1000)    Assessment: 62 yo male with NSTEMI post PCI will be continued on aggrastat x 6 hours.  CrCl ~67  Goal of Therapy:   Monitor platelets by anticoagulation protocol: Yes   Plan:  1) Continue aggrastat at 0.15 mcg/kg/min x 6hrs then off.  Greycen Felter, Tsz-Yin 10/07/2013,10:56 AM

## 2013-10-07 NOTE — Progress Notes (Signed)
TR BAND REMOVAL  LOCATION:  right radial  DEFLATED PER PROTOCOL:  yes  TIME BAND OFF / DRESSING APPLIED:   1500   SITE UPON ARRIVAL:   Level 0  SITE AFTER BAND REMOVAL:  Level 0  REVERSE ALLEN'S TEST:    positive  CIRCULATION SENSATION AND MOVEMENT:  Within Normal Limits  yes  COMMENTS:    

## 2013-10-07 NOTE — Interval H&P Note (Signed)
History and Physical Interval Note:  10/07/2013 7:40 AM  Gary [name]  has presented today for surgery, with the diagnosis of cp  The various methods of treatment have been discussed with the patient and family. After consideration of risks, benefits and other options for treatment, the patient has consented to  Procedure(s): LEFT HEART CATHETERIZATION WITH CORONARY ANGIOGRAM (N/A) as a surgical intervention .  The patient's history has been reviewed, patient examined, no change in status, stable for surgery.  I have reviewed the patient's chart and labs.  Questions were answered to the patient's satisfaction.    Cath Lab Visit (complete for each Cath Lab visit)  Clinical Evaluation Leading to the Procedure:   ACS: yes  Non-ACS:    Anginal Classification: CCS IV  Anti-ischemic medical therapy: Minimal Therapy (1 class of medications)  Non-Invasive Test Results: No non-invasive testing performed  Prior CABG: No previous CABG        Tonny Bollman

## 2013-10-07 NOTE — Care Management Note (Addendum)
    Page 1 of 1   10/08/2013     2:27:33 PM   CARE MANAGEMENT NOTE 10/08/2013  Patient:  Gary [name], Gary [name]   Account Number:  [id number]  Date Initiated:  10/07/2013  Documentation initiated by:  Junius Creamer  Subjective/Objective Assessment:   adm w mi, in cath lab     Action/Plan:   lives w wife, pcp dr Aram Beecham butler   Anticipated DC Date:  10/09/2013   Anticipated DC Plan:  HOME/SELF CARE         Choice offered to / List presented to:     DME arranged  VEST - LIFE VEST           Status of service:   Medicare Important Message given?   (If response is "NO", the following Medicare IM given date fields will be blank) Date Medicare IM given:   Date Additional Medicare IM given:    Discharge Disposition:    Per UR Regulation:  Reviewed for med. necessity/level of care/duration of stay  If discussed at Long Length of Stay Meetings, dates discussed:    Comments:  10/08/13 1130 Oletta Cohn, RN, BSN, [employer] [phone] Spoke with pt and family at bedside regarding benefits check for Brilinta 90mg .  Pt has brochure with 30 day free card and refill assistance card intact.  Pt utilizes [facility] in [place] for prescription needs.  NCM called pharmacy to confirm availability of medication.  [facility] [place] [facility]- [facility] [address] [facility] [place], [place] and will [facility] [place]. Pt verbalizes importance of filling medication upon discharge.  10/07/13 1600 Camellia Wood, RN, BSN, [place] [phone] NCM consult for Life Vest.  Call into Dennis Bast, Life Vest rep [phone]).  ---10/07/2013 1529 by Dellia Cloud--- per bcbs [place] online: Marden Noble is a tier 3 medication, covered, co-pay $60.00.

## 2013-10-07 NOTE — CV Procedure (Signed)
Cardiac Catheterization Procedure Note  Name: Gary [name] MRN: [id number] DOB: 21-Sep-1951  Procedure: Left Heart Cath, Selective Coronary Angiography, LV angiography, PTCA of the first diagonal  Indication: NSTEMI. 62 year-old male smoker with no past history of cardiac disease presented with NSTEMI and VT. Referred for cath and possible PCI.  Procedural Details:  The right wrist was prepped, draped, and anesthetized with 1% lidocaine. Using the modified Seldinger technique, a 5/6 French sheath was introduced into the right radial artery. 3 mg of verapamil was administered through the sheath, weight-based unfractionated heparin was administered intravenously. Standard Judkins catheters were used for selective coronary angiography and left ventriculography. Catheter exchanges were performed over an exchange length guidewire.  PROCEDURAL FINDINGS Hemodynamics: AO 135/67 LV 135/11   Coronary angiography: Coronary dominance: right  Left mainstem: Widely patent. Arises from left cusp and divides into the LAD and LCx  Left anterior descending (LAD): Patent throughout. The proximal vessel has minor 20% nonobstructive stenosis. The mid and distal vessel are widely patent. The first diagonal has 95% stenosis with associated hypodensity consistent with acute plaque rupture.  Left circumflex (LCx): The LCx is patent. The first OM is patent without disease.  Right coronary artery (RCA): Dominant vessel. No obstructive disease. The PDA and PLA are patent.  Left ventriculography: Left ventricular systolic function is severely reduced. The anterolateral wall is akinetic. The other segments are hypokinetic. The estimated LVEF is 35%.  PCI Note:  Following the diagnostic procedure, the decision was made to proceed with PCI. The lesion in the first diagonal is clearly the patient's culprit lesion. It is a true ostial lesion and the diagonal originates at an acute angle from the LAD.  Weight-based  heparin was given for anticoagulation. Tirofiban was also administered via protocol. Once a therapeutic ACT was achieved, a 6 [place] XB-LAD guide catheter was inserted.  A cougar coronary guidewire was initially attempted but would not cross the lesion. A whisper wire was used to cross the lesion with a moderate amount of difficulty.  The lesion was dilated with a 2.5x12 balloon but was difficult to cross. I tried to pass a 2.75 mm balloon but it would not cross. The balloon and wire position was lost. At that point, angiography demonstrated total occlusion of the vessel and suggestion of dissection. I was able to rewire the vessel but had difficulty crossing again with any balloons. I was finally able to cross again with a 1.5x6 balloon then a 1.5 x 20 mm balloon. Prolonged inflations to 10 atmospheres were done with both balloons. Being that this was a true ostial lesion and originated at an acute angulation, I did not think stenting was feasible. The patient had no chest pain throughout the entirety of the procedure. Following PCI, there was 40% residual stenosis and TIMI-3 flow. There was residual dissection plane present. Final angiography confirmed an acceptable result. The patient tolerated the procedure well.  A TR band was used for radial hemostasis. The patient was transferred to the post catheterization recovery area for further monitoring.  PCI Data: Vessel - Diagonal 1/Segment - prox (ostial) Percent Stenosis (pre)  95 TIMI-flow 3 Stent none Percent Stenosis (post) 40 TIMI-flow (post) 3  Final Conclusions:   1. 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  2. Wide patency of the LAD, LCx, and RCA 3. Severe segmental LV dysfunction 4. Successful balloon angioplasty of the first diagonal, complicated by dissection but TIMI-3 flow restored with prolonged balloon inflations (patient chest  pain free thorughout   Recommendations:  ASA 81 mg, brilinta x 12 months. Life Vest  with severe LV dysfunction/VT.  Tonny Bollman 10/07/2013, 9:45 AM

## 2013-10-08 ENCOUNTER — Telehealth: Payer: Self-pay | Admitting: *Deleted

## 2013-10-08 ENCOUNTER — Encounter (HOSPITAL_COMMUNITY): Payer: Self-pay | Admitting: Physician Assistant

## 2013-10-08 DIAGNOSIS — E43 Unspecified severe protein-calorie malnutrition: Secondary | ICD-10-CM | POA: Insufficient documentation

## 2013-10-08 LAB — BASIC METABOLIC PANEL
Calcium: 9.3 mg/dL (ref 8.4–10.5)
GFR calc Af Amer: >90 mL/min (ref >90)
GFR calc non Af Amer: >90 mL/min (ref >90)
Potassium: 3.8 mEq/L (ref 3.5–5.1)
Sodium: 139 mEq/L (ref 135–145)

## 2013-10-08 LAB — CBC
Hemoglobin: 14.6 g/dL (ref 13.0–17.0)
MCH: 32.2 pg (ref 26.0–34.0)
MCHC: 34.2 g/dL (ref 30.0–36.0)
Platelets: 208 10*3/uL (ref 150–400)
RDW: 14.3 % (ref 11.5–15.5)
WBC: 11.6 10*3/uL — ABNORMAL HIGH (ref 4.0–10.5)

## 2013-10-08 MED ORDER — LOSARTAN POTASSIUM 50 MG PO TABS
50.0000 mg | ORAL_TABLET | Freq: Every day | ORAL | Status: DC
  Administered 2013-10-08: 20:00:00 50 mg via ORAL
  Filled 2013-10-08 (×2): qty 1

## 2013-10-08 MED ORDER — CARVEDILOL 6.25 MG PO TABS: 6.2500 mg | ORAL_TABLET | Freq: Two times a day (BID) | ORAL | Status: DC

## 2013-10-08 MED ORDER — LOSARTAN POTASSIUM 50 MG PO TABS: 50.0000 mg | ORAL_TABLET | Freq: Every day | ORAL | Status: DC

## 2013-10-08 MED ORDER — TICAGRELOR 90 MG PO TABS: 90.0000 mg | ORAL_TABLET | Freq: Two times a day (BID) | ORAL | Status: DC

## 2013-10-08 MED ORDER — CARVEDILOL 6.25 MG PO TABS
6.2500 mg | ORAL_TABLET | Freq: Two times a day (BID) | ORAL | Status: DC
  Administered 2013-10-08: 19:00:00 6.25 mg via ORAL
  Filled 2013-10-08 (×2): qty 1

## 2013-10-08 MED ORDER — ENSURE COMPLETE PO LIQD: 237.0000 mL | Freq: Two times a day (BID) | ORAL | Status: DC

## 2013-10-08 MED ORDER — NITROGLYCERIN 0.4 MG SL SUBL: 0.4000 mg | SUBLINGUAL_TABLET | SUBLINGUAL | Status: AC | PRN

## 2013-10-08 MED ORDER — ENSURE COMPLETE PO LIQD
237.0000 mL | Freq: Two times a day (BID) | ORAL | Status: DC
  Administered 2013-10-08: 14:00:00 237 mL via ORAL
  Filled 2013-10-08 (×4): qty 237

## 2013-10-08 MED ORDER — ASPIRIN 81 MG PO TBEC: 81.0000 mg | DELAYED_RELEASE_TABLET | Freq: Every day | ORAL | Status: DC

## 2013-10-08 MED ORDER — EZETIMIBE 10 MG PO TABS: 10.0000 mg | ORAL_TABLET | Freq: Every day | ORAL | Status: DC

## 2013-10-08 NOTE — Progress Notes (Signed)
INITIAL NUTRITION ASSESSMENT  DOCUMENTATION CODES Per approved criteria  -Severe malnutrition in the context of chronic illness -Underweight   INTERVENTION:  1. Ensure Complete po BID, each supplement provides 350 kcal and 13 grams of protein.  2. Encouraged less fat in diet and increase lean protein in diet. Reviewed food sources of these.   NUTRITION DIAGNOSIS: Malnutrition related to chronic illness as evidenced by severe fat and muscle wating.   Goal: Pt to meet >/= 90% of their estimated nutrition needs   Monitor:  PO intake, supplement acceptance, weight trend, labs  Reason for Assessment: Pt identified as at nutrition risk on the Malnutrition Screen Tool  62 y.o. male  Admitting Dx: <principal problem not specified>  ASSESSMENT: Pt admitted with chest pain s/p cath with PCI. Plan for pt to have a life vest prior to d/c.  Pt screened for weight loss. Per pt and his wife pt has lost a few pounds recently. 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 Per pt he lost a lot of weight/muscle in 2000 when he broke his neck and he has been unable to regain this weight/muscle. Pt eats about 4 small meals per day. Breakfast is an egg biscuit, lunch is a [place] sandwich, and supper is a sandwich or a TV dinner. Pt also snacks on little cakes. Pt is willing to try some ensure. Encouraged a lower fat/sugar intake and increase lean protein in diet.  Pt reports that he has always been thin but he lost a lot of his muscle and has not been able to re-gain this.   Nutrition Focused Physical Exam:  Subcutaneous Fat:  Orbital Region: WNL Upper Arm Region: severe wasting Thoracic and Lumbar Region: severe wasting  Muscle:  Temple Region: severe wasting Clavicle Bone Region: severe wasting Clavicle and Acromion Bone Region: severe wasting Scapular Bone Region: mild wasting Dorsal Hand: mild wasting Patellar Region: NA Anterior Thigh  Region: NA Posterior Calf Region: NA  Edema: not present   Height: Ht Readings from Last 1 Encounters:  10/05/13 5\' 6"  (1.676 m)    Weight: Wt Readings from Last 1 Encounters:  10/08/13 110 lb 7.2 oz (50.1 kg)    Ideal Body Weight: 64.5 kg   % Ideal Body Weight: 78%  Wt Readings from Last 10 Encounters:  10/08/13 110 lb 7.2 oz (50.1 kg)  10/08/13 110 lb 7.2 oz (50.1 kg)    Usual Body Weight: 112 lb   % Usual Body Weight: 98%  BMI:  Body mass index is 17.84 kg/(m^2).  Estimated Nutritional Needs: Kcal: 1600-1800 Protein: 80-90 grams Fluid: >1.6 L/day  Skin: no issues noted  Diet Order: Carb Control Meal Completion: 100%  EDUCATION NEEDS: -No education needs identified at this time   Intake/Output Summary (Last 24 hours) at 10/08/13 0810 Last data filed at 10/07/13 1900  Gross per 24 hour  Intake 1162.1 ml  Output    500 ml  Net  662.1 ml    Last BM: PTA   Labs:   Recent Labs Lab 10/05/13 1808 10/05/13 2300 10/06/13 0525 10/07/13 0400 10/08/13 0540  NA 140 140 139 141 139  K 3.2* 3.1* 3.8 3.5 3.8  CL 102 103 104 106 103  CO2 26 27 27 26 27   BUN 17 15 13 13 14   CREATININE 0.73 0.65 0.70 0.75 0.84  CALCIUM 9.2 9.2 9.2 8.8 9.3  MG  --  1.9  --   --   --  GLUCOSE 126* 114* 108* 105* 102*    CBG (last 3)   Recent Labs  10/07/13 1022  GLUCAP 113*    Scheduled Meds: . aspirin EC  81 mg Oral Daily  . ezetimibe  10 mg Oral Daily  . irbesartan  150 mg Oral QHS  . metoprolol tartrate  12.5 mg Oral BID  . pneumococcal 23 valent vaccine  0.5 mL Intramuscular Tomorrow-1000  . Ticagrelor  90 mg Oral BID    Continuous Infusions:   Past Medical History  Diagnosis Date  . Hypertension   . Dyslipidemia   . Anxiety   . Broken neck     History reviewed. No pertinent past surgical history.  Kendell Bane RD, LDN, CNSC [phone] Pager [phone] After Hours Pager

## 2013-10-08 NOTE — Progress Notes (Signed)
Subjective:  Feels ok. No chest pain or dyspnea.   Objective:  Vital Signs in the last 24 hours: Temp:  [97.7 F (36.5 C)-98.4 F (36.9 C)] 98.4 F (36.9 C) (12/02 0807) Pulse Rate:  [58-77] 69 (12/02 0807) Resp:  [16-20] 18 (12/02 0807) BP: (117-159)/(42-117) 153/81 mmHg (12/02 0807) SpO2:  [96 %-98 %] 96 % (12/02 0450) Weight:  [110 lb 7.2 oz (50.1 kg)] 110 lb 7.2 oz (50.1 kg) (12/02 0010)  Intake/Output from previous day: 12/01 0701 - 12/02 0700 In: 1162.1 [P.O.:720; I.V.:442.1] Out: 500 [Urine:500]  Physical Exam: Pt is alert and oriented, NAD HEENT: normal Neck: JVP - normal Lungs: decreased breath sounds bilaterally CV: RRR without murmur or gallop, distant Abd: soft, NT, Positive BS, no hepatomegaly Ext: no C/C/E, distal pulses intact and equal Skin: warm/dry no rash   Lab Results:  Recent Labs  10/07/13 0400 10/08/13 0540  WBC 9.7 11.6*  HGB 14.8 14.6  PLT 206 208    Recent Labs  10/07/13 0400 10/08/13 0540  NA 141 139  K 3.5 3.8  CL 106 103  CO2 26 27  GLUCOSE 105* 102*  BUN 13 14  CREATININE 0.75 0.84    Recent Labs  10/06/13 0525 10/06/13 1250  TROPONINI >20.00* >20.00*   Tele: Sinus rhythm  2D Echo: Left ventricle: Diffuse hypokinesis mid and basal inferior wall akinesis The cavity size was moderately dilated. Wall thickness was normal. The estimated ejection fraction was 30%.  ------------------------------------------------------------ Aortic valve: Mildly calcified leaflets. Doppler: There was no stenosis. No regurgitation.  ------------------------------------------------------------ Mitral valve: Mildly thickened leaflets . Doppler: Mild regurgitation.  ------------------------------------------------------------ Left atrium: The atrium was normal in size.  ------------------------------------------------------------ Atrial septum: No defect or patent foramen ovale  was identified.  ------------------------------------------------------------ Right ventricle: The cavity size was normal. Wall thickness was normal. Systolic function was normal.  ------------------------------------------------------------ Pulmonic valve: Structurally normal valve. Cusp separation was normal. Doppler: Transvalvular velocity was within the normal range. Trivial regurgitation.  ------------------------------------------------------------ Tricuspid valve: Structurally normal valve. Leaflet separation was normal. Doppler: Transvalvular velocity was within the normal range. Mild regurgitation.  ------------------------------------------------------------ Right atrium: The atrium was normal in size.  ------------------------------------------------------------ Pericardium: The pericardium was normal in appearance.  ------------------------------------------------------------  2D measurements Normal Doppler measurements Normal Left ventricle Left ventricle LVID ED, 50.2 mm 43-52 Ea, lat ann, 12. cm/s ------ chord, tiss DP 3 PLAX E/Ea, lat 3.9 ------ LVID ES, 37.4 mm 23-38 ann, tiss DP 8 chord, Ea, med ann, 9.9 cm/s ------ PLAX tiss DP 8 FS, chord, 25 % >29 E/Ea, med 4.9 ------ PLAX ann, tiss DP LVPW, ED 7.18 mm ------ Mitral valve IVS/LVPW 0.87 <1.3 Peak E vel 48. cm/s ------ ratio, ED 9 Ventricular septum Peak A vel 52. cm/s ------ IVS, ED 6.24 mm ------ 8 Aorta Deceleration 190 ms 150-23 Root diam, 36 mm ------ time 0 ED Peak E/A 0.9 ------ Left atrium ratio AP dim 35 mm ------ Systemic veins AP dim 2.29 cm/m^2 <2.2 Estimated CVP 10 mm ------ index Hg Right ventricle Sa vel, lat 8.6 cm/s ------ ann, tiss DP 6  Assessment/Plan:  1. NSTEMI: critical diagonal stenosis and wall motion abnormality consistent with diagonal infarct. S/P balloon angioplasty of the diagonal. Continue DAPT with ASA/Brilinta x 12 months as tolerated. No further angina.  2.  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 Change metoprolol to carvedilol 6.25 mg BID. Continue Avapro. He had VT but no further rhythm problems. Plan LifeVest considering new diagnosis and extent of  LV dysfunction (LVEF 30%).  3. Hyperlipidemia: statin intolerant. Continue zetia.   4. Tobacco: cessation counseling done. He is motivated to quit.  Pt should have transition of care visit. Lives in [place]. No work or driving until after his visit.  Tonny Bollman, M.D. 10/08/2013, 12:07 PM

## 2013-10-08 NOTE — Discharge Summary (Signed)
Discharge Summary   Patient ID: Gary [name] MRN: [id number], DOB/AGE: Nov 13, 1950 62 y.o. Admit date: 10/05/2013 D/C date:     10/08/2013  Primary Care Provider: Samuel Jester, DO Primary Cardiologist: Patient wants Gary [name] to be his primary cardiologist (willing to come to [facility]) but will f/u in [place] with NP for his first post-hospital visit (lives in [place])  Primary Discharge Diagnoses:  1. CAD with NSTEMI - s/p balloon angioplasty of D1, c/b dissection but flow restored with prolonged balloon inflations 2. Ventricular tachycardia 3. Ischemic cardiomyopathy CM EF 30% - discharged with LifeVest 4. Hyperlipidemia, intolerant to statins 5. Tobacco abuse 6. Hypokalemia 7. Mild MR by echo 10/06/13 8. HTN 9. Protein calorie malnutrition  Secondary Discharge Diagnoses:  1. H/o anxiety 2. H/o broken neck  Hospital Course: Gary [name] is a 62 y/o M with history of HTN, HL (not on statin due to statin allergies) and tobacco abuse (40 pack years of smoking) who presented to [facility] on Saturday 10/05/13 with chest pain. He was in his usual state of health until about 11 am on the day of presentation when he started to experience post-prandial chest pain that started about 1 hour after he eating hot pepper. It was described as "hurting and burning," rated as 4/10 in severity. It was not associated with nausea/vomiting, diaphoresis, shortness of breath, diaphoresis, palpitation, dizziness, syncope or radiation. His pain initially lasted about 5-10 minutes before spontaneous relief, but it recurred again which prompted him to seek medical attention. In the ER, initial EKG showed NSR without ST-T changes to suggest ischemia. However, while observed in th ED he developed sudden recurrent monomorphic non-sustained ventricular tachycardia with heart rate ranging about 120-140 bpm requiring initiation of Amiodarone 150 mg bolus followed by a drip. He remained clinically and  hemodyanmically stable and was without further CP. Initial troponin returned elevated at 6.09 confirming the diagnosis of NSTEMI. He was placed on Lopressor, Zetia (given statin allergies), heparin, and continued on amiodarone drip. Smoking cessation was advised. Potassium was low and was subsequently repleted. Troponin peaked at >20. Repeat EKG showed no ST changes. He was clinically observed over the weekend with plan for cath on Monday. 2D echo 11/30 showed EF 30%, moderately dilated LV, diffuse hypokinesis mid and basal inferior wall akinesis, mild MR. Cath was performed 10/07/13 demonstrating: 1. 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  2. Wide patency of the LAD, LCx, and RCA  3. Severe segmental LV dysfunction  EF 35% 4. Successful balloon angioplasty of the first diagonal, complicated by dissection but TIMI-3 flow restored with prolonged balloon inflations (patient chest pain free thorughout  The patient tolerated the procedure well. DAPT with ASA & Brilinta x 12 months was recommended. Rhythm remained stable. In light of his MI and severe cardiomyopathy, LifeVest was recommended and arranged at discharge. The patient did not have any clinical evidence of CHF. His Lopressor was changed to Coreg given cardiomyopathy. On admission, his Azor had been changed to ARB only (irbesartan)- the patient requested the cheapest option at discharge so he was changed to equivalent dose of losartan. Consider f/u lipids, LFTs given Zetia initiation this admission. He will need repeat echo as outpatient to determine candidacy for ICD if EF not improved. Dr. Excell [name] has seen and examined the patient today and feels he is stable for discharge. He was instructed not to return to work until cleared in followup and not to drive until cleared.  Discharge Vitals: Blood pressure  126/67, pulse 66, temperature 98.6 F (37 C), temperature source Oral, resp. rate 18, height 5\' 6"  (1.676 m), weight 110 lb 7.2  oz (50.1 kg), SpO2 96.00%.  Labs: Lab Results  Component Value Date   WBC 11.6* 10/08/2013   HGB 14.6 10/08/2013   HCT 42.7 10/08/2013   MCV 94.1 10/08/2013   PLT 208 10/08/2013    Recent Labs Lab 10/05/13 2300  10/08/13 0540  NA 140  < > 139  K 3.1*  < > 3.8  CL 103  < > 103  CO2 27  < > 27  BUN 15  < > 14  CREATININE 0.65  < > 0.84  CALCIUM 9.2  < > 9.3  PROT 6.2  --   --   BILITOT 0.3  --   --   ALKPHOS 73  --   --   ALT 19  --   --   AST 90*  --   --   GLUCOSE 114*  < > 102*  < > = values in this interval not displayed.  Recent Labs  10/05/13 2300 10/06/13 0525 10/06/13 1250  TROPONINI >20.00* >20.00* >20.00*   Lab Results  Component Value Date   CHOL 220* 10/06/2013   HDL 36* 10/06/2013   LDLCALC 167* 10/06/2013   TRIG 85 10/06/2013     Diagnostic Studies/Procedures   Dg Chest Portable 1 View 10/05/2013   CLINICAL DATA:  Chest pain  EXAM: PORTABLE CHEST - 1 VIEW  COMPARISON:  None available  FINDINGS: The heart size and mediastinal contours are within normal limits. Both lungs are clear. Cardiac leads project of the chest. No visible pleural effusion. Postsurgical changes of the cervical spine are noted. No acute osseous abnormality.  IMPRESSION: No active disease.   Electronically Signed   By: Britta Mccreedy M.D.   On: 10/05/2013 20:38   2D Echo 10/06/13 - Left ventricle: Diffuse hypokinesis mid and basal inferior wall akinesis The cavity size was moderately dilated. Wall thickness was normal. The estimated ejection fraction was 30%. - Mitral valve: Mild regurgitation. - Atrial septum: No defect or patent foramen ovale was Identified.  Cardiac catheterization this admission, please see full report and above for summary.   Discharge Medications     Medication List    STOP taking these medications       amlodipine-olmesartan 10-20 MG per tablet  Commonly known as:  AZOR      TAKE these medications       acetaminophen 500 MG tablet  Commonly  known as:  TYLENOL  Take 500 mg by mouth every 6 (six) hours as needed for moderate pain.     ALPRAZolam 0.5 MG tablet  Commonly known as:  XANAX  Take 0.25 mg by mouth daily as needed for anxiety.     aspirin 81 MG EC tablet  Take 1 tablet (81 mg total) by mouth daily.     carvedilol 6.25 MG tablet  Commonly known as:  COREG  Take 1 tablet (6.25 mg total) by mouth 2 (two) times daily with a meal.     ezetimibe 10 MG tablet  Commonly known as:  ZETIA  Take 1 tablet (10 mg total) by mouth daily.     feeding supplement (ENSURE COMPLETE) Liqd  Take 237 mLs by mouth 2 (two) times daily between meals. Ensure Complete     losartan 50 MG tablet  Commonly known as:  COZAAR  Take 1 tablet (50 mg total) by mouth at bedtime.  nitroGLYCERIN 0.4 MG SL tablet  Commonly known as:  NITROSTAT  Place 1 tablet (0.4 mg total) under the tongue every 5 (five) minutes as needed for chest pain (up to 3 doses).     Ticagrelor 90 MG Tabs tablet  Commonly known as:  BRILINTA  Take 1 tablet (90 mg total) by mouth 2 (two) times daily.        Disposition   The patient will be discharged in stable condition to home. Discharge Orders   Future Appointments Provider Department Dept Phone   10/16/2013 2:50 PM Jodelle Gross, NP [facility] [phone]   Future Orders Complete By Expires   Amb Referral to Cardiac Rehabilitation  As directed    Comments:     Pt agrees to Outpt. CRP in [place], will send referral.   Diet - low sodium heart healthy  As directed    Discharge instructions  As directed    Comments:     One of your heart tests showed weakness of the heart muscle this admission. This may make you more susceptible to weight gain from fluid retention, which can lead to symptoms that we call heart failure. For patients with congestive heart failure, we give them these special instructions:  1. Follow a low-salt diet and watch your fluid intake. In general, you should not  be taking in more than 2 liters of fluid per day (no more than 8 glasses per day). Some patients are restricted to less than 1.5 liters of fluid per day (no more than 6 glasses per day). This includes sources of water in foods like soup, coffee, tea, milk, etc. 2. Weigh yourself on the same scale at same time of day and keep a log. 3. Call your doctor: (Anytime you feel any of the following symptoms)  - 3-4 pound weight gain in 1-2 days or 2 pounds overnight  - Shortness of breath, with or without a dry hacking cough  - Swelling in the hands, feet or stomach  - If you have to sleep on extra pillows at night in order to breathe  IT IS IMPORTANT TO LET YOUR DOCTOR KNOW EARLY ON IF YOU ARE HAVING SYMPTOMS SO WE CAN HELP YOU!   Increase activity slowly  As directed    Comments:     No driving until cleared by your cardiologist. No lifting over 10 lbs for 4 weeks. No sexual activity for 4 weeks. You may not return to work until cleared by your cardiologist. Keep procedure site clean & dry. If you notice increased pain, swelling, bleeding or pus, call/return!  You may shower, but no soaking baths/hot tubs/pools for 1 week.     Follow-up Information   Follow up with Joni Reining, NP. [facility] office - 10/16/13 at 2:50pm)    Specialty:  Nurse Practitioner   Contact information:   [address] [place] [address] [phone]         Duration of Discharge Encounter: Greater than 30 minutes including physician and PA time.  Signed, Ronie Spies PA-C 10/08/2013, 4:01 PM

## 2013-10-08 NOTE — Telephone Encounter (Signed)
TCM 7 DAY

## 2013-10-08 NOTE — Progress Notes (Addendum)
CARDIAC REHAB PHASE I   PRE:  Rate/Rhythm: 79 SR  BP:  Supine:   Sitting: 168/96  Standing:    SaO2:   MODE:  Ambulation: 1000 ft   POST:  Rate/Rhythm: 93 SR  BP:  Supine:   Sitting: 164/87  Standing:    SaO2:  0815-1000 Pt tolerated ambulation well without c/o of cp or SOB. BP elevated before and after walk. Completed MI and CHF education with pt and wife. They voice understanding. Pt agrees to Outpt. CRP in , will send referral.  We discussed smoking cessation, I gave pt tips for quitting and coaching contact number. Melina Copa RN 10/08/2013 10:11 AM

## 2013-10-08 NOTE — Discharge Summary (Signed)
See my note this same date. thx

## 2013-10-09 NOTE — Telephone Encounter (Signed)
Patient contacted regarding discharge from [facility] on 10/07/13 .  Patient understands to follow up with provider Joni Reining, NP on 10/16/13 at 2:50 pm at [facility] office. Patient understands discharge instructions? yes Patient understands medications and regiment? yes Patient understands to bring all medications to this visityes?

## 2013-10-16 ENCOUNTER — Ambulatory Visit (INDEPENDENT_AMBULATORY_CARE_PROVIDER_SITE_OTHER): Payer: BC Managed Care – PPO | Admitting: Adult Health

## 2013-10-16 ENCOUNTER — Encounter: Payer: Self-pay | Admitting: Adult Health

## 2013-10-16 VITALS — BP 149/82 | HR 96 | Ht 66.0 in | Wt 115.0 lb

## 2013-10-16 DIAGNOSIS — E119 Type 2 diabetes mellitus without complications: Secondary | ICD-10-CM

## 2013-10-16 DIAGNOSIS — I519 Heart disease, unspecified: Secondary | ICD-10-CM

## 2013-10-16 DIAGNOSIS — I214 Non-ST elevation (NSTEMI) myocardial infarction: Secondary | ICD-10-CM

## 2013-10-16 DIAGNOSIS — E7800 Pure hypercholesterolemia, unspecified: Secondary | ICD-10-CM

## 2013-10-16 NOTE — Progress Notes (Deleted)
Name: Gary [name]    DOB: 1951-09-10  Age: 62 y.o.  MR#: [id number]       PCP:  Samuel Jester, DO      Insurance: Payor: BLUE CROSS BLUE SHIELD / Plan: BCBS [place] PPO / Product Type: *No Product type* /   CC:    Chief Complaint  Patient presents with  . Coronary Artery Disease  . Cardiomyopathy    Ischemic    VS Filed Vitals:   10/16/13 1434  BP: 149/82  Pulse: 96  Height: 5\' 6"  (1.676 m)  Weight: 115 lb (52.164 kg)    Weights Current Weight  10/16/13 115 lb (52.164 kg)  10/08/13 110 lb 7.2 oz (50.1 kg)  10/08/13 110 lb 7.2 oz (50.1 kg)    Blood Pressure  BP Readings from Last 3 Encounters:  10/16/13 149/82  10/08/13 151/91  10/08/13 151/91     Admit date:  (Not on file) Last encounter with RMR:  Visit date not found   Allergy Morphine and related and Statins  Current Outpatient Prescriptions  Medication Sig Dispense Refill  . acetaminophen (TYLENOL) 500 MG tablet Take 500 mg by mouth every 6 (six) hours as needed for moderate pain.      Marland Kitchen ALPRAZolam (XANAX) 0.5 MG tablet Take 0.25 mg by mouth daily as needed for anxiety.      Marland Kitchen aspirin EC 81 MG EC tablet Take 1 tablet (81 mg total) by mouth daily.      . carvedilol (COREG) 6.25 MG tablet Take 1 tablet (6.25 mg total) by mouth 2 (two) times daily with a meal.  60 tablet  6  . ezetimibe (ZETIA) 10 MG tablet Take 1 tablet (10 mg total) by mouth daily.  30 tablet  6  . feeding supplement (BOOST HIGH PROTEIN) LIQD Take 1 Container by mouth daily.      Marland Kitchen losartan (COZAAR) 50 MG tablet Take 1 tablet (50 mg total) by mouth at bedtime.  30 tablet  6  . nitroGLYCERIN (NITROSTAT) 0.4 MG SL tablet Place 1 tablet (0.4 mg total) under the tongue every 5 (five) minutes as needed for chest pain (up to 3 doses).  25 tablet  3  . Ticagrelor (BRILINTA) 90 MG TABS tablet Take 1 tablet (90 mg total) by mouth 2 (two) times daily.  60 tablet  10   No current facility-administered medications for this visit.    Discontinued Meds:     Medications Discontinued During This Encounter  Medication Reason  . feeding supplement, ENSURE COMPLETE, (ENSURE COMPLETE) LIQD Error    Patient Active Problem List   Diagnosis Date Noted  . Protein-calorie malnutrition, severe 10/08/2013  . NSTEMI (non-ST elevated myocardial infarction) 10/05/2013    LABS    Component Value Date/Time   NA 139 10/08/2013 0540   NA 141 10/07/2013 0400   NA 139 10/06/2013 0525   K 3.8 10/08/2013 0540   K 3.5 10/07/2013 0400   K 3.8 10/06/2013 0525   CL 103 10/08/2013 0540   CL 106 10/07/2013 0400   CL 104 10/06/2013 0525   CO2 27 10/08/2013 0540   CO2 26 10/07/2013 0400   CO2 27 10/06/2013 0525   GLUCOSE 102* 10/08/2013 0540   GLUCOSE 105* 10/07/2013 0400   GLUCOSE 108* 10/06/2013 0525   BUN 14 10/08/2013 0540   BUN 13 10/07/2013 0400   BUN 13 10/06/2013 0525   CREATININE 0.84 10/08/2013 0540   CREATININE 0.75 10/07/2013 0400   CREATININE 0.70 10/06/2013 0525  CALCIUM 9.3 10/08/2013 0540   CALCIUM 8.8 10/07/2013 0400   CALCIUM 9.2 10/06/2013 0525   GFRNONAA >90 10/08/2013 0540   GFRNONAA >90 10/07/2013 0400   GFRNONAA >90 10/06/2013 0525   GFRAA >90 10/08/2013 0540   GFRAA >90 10/07/2013 0400   GFRAA >90 10/06/2013 0525   CMP     Component Value Date/Time   NA 139 10/08/2013 0540   K 3.8 10/08/2013 0540   CL 103 10/08/2013 0540   CO2 27 10/08/2013 0540   GLUCOSE 102* 10/08/2013 0540   BUN 14 10/08/2013 0540   CREATININE 0.84 10/08/2013 0540   CALCIUM 9.3 10/08/2013 0540   PROT 6.2 10/05/2013 2300   ALBUMIN 3.7 10/05/2013 2300   AST 90* 10/05/2013 2300   ALT 19 10/05/2013 2300   ALKPHOS 73 10/05/2013 2300   BILITOT 0.3 10/05/2013 2300   GFRNONAA >90 10/08/2013 0540   GFRAA >90 10/08/2013 0540       Component Value Date/Time   WBC 11.6* 10/08/2013 0540   WBC 9.7 10/07/2013 0400   WBC 11.4* 10/06/2013 0525   HGB 14.6 10/08/2013 0540   HGB 14.8 10/07/2013 0400   HGB 14.6 10/06/2013 0525   HCT 42.7 10/08/2013 0540   HCT 43.4 10/07/2013 0400   HCT  42.9 10/06/2013 0525   MCV 94.1 10/08/2013 0540   MCV 94.6 10/07/2013 0400   MCV 94.1 10/06/2013 0525    Lipid Panel     Component Value Date/Time   CHOL 220* 10/06/2013 0525   TRIG 85 10/06/2013 0525   HDL 36* 10/06/2013 0525   CHOLHDL 6.1 10/06/2013 0525   VLDL 17 10/06/2013 0525   LDLCALC 167* 10/06/2013 0525    ABG No results found for this basename: phart, pco2, pco2art, po2, po2art, hco3, tco2, acidbasedef, o2sat     Lab Results  Component Value Date   TSH 0.704 10/05/2013   BNP (last 3 results)  Recent Labs  10/05/13 2300  PROBNP 223.0*   Cardiac Panel (last 3 results) No results found for this basename: CKTOTAL, CKMB, TROPONINI, RELINDX,  in the last 72 hours  Iron/TIBC/Ferritin No results found for this basename: iron, tibc, ferritin     EKG Orders placed during the hospital encounter of 10/05/13  . EKG 12-LEAD  . EKG 12-LEAD  . EKG 12-LEAD  . EKG 12-LEAD  . EKG 12-LEAD  . EKG 12-LEAD  . EKG 12-LEAD  . EKG 12-LEAD  . EKG 12-LEAD  . EKG 12-LEAD  . EKG  . EKG 12-LEAD     Prior Assessment and Plan Problem List as of 10/16/2013   NSTEMI (non-ST elevated myocardial infarction)   Protein-calorie malnutrition, severe       Imaging: Dg Chest Portable 1 View  10/05/2013   CLINICAL DATA:  Chest pain  EXAM: PORTABLE CHEST - 1 VIEW  COMPARISON:  None available  FINDINGS: The heart size and mediastinal contours are within normal limits. Both lungs are clear. Cardiac leads project of the chest. No visible pleural effusion. Postsurgical changes of the cervical spine are noted. No acute osseous abnormality.  IMPRESSION: No active disease.   Electronically Signed   By: Britta Mccreedy M.D.   On: 10/05/2013 20:38

## 2013-10-16 NOTE — Assessment & Plan Note (Signed)
He will continue on diabetic diet and metformin as directed. He will continue to followup with Dr. Sudie Bailey for ongoing management.

## 2013-10-16 NOTE — Assessment & Plan Note (Addendum)
The patient is doing well without complaints of chest pain shortness of breath dizziness or weakness. He is medically compliant. He has stopped smoking. He is making lifestyle changes concerning his diet. He will begin cardiac rehabilitation in January 2015. He is advised to stay out of work until being followed up by Dr. Excell Seltzer and 2-1/2 months. He will continue his life vest. He is advised to walk approximately 15 minutes daily. He is given permission to drive. He is not to drive long distances. He is not to lift anything over 20 pounds.

## 2013-10-16 NOTE — Assessment & Plan Note (Signed)
He is reminded of healthy lifestyle changes, avoiding fried foods, fast foods, and eating more fruits and vegetables. He is to increase his exercise. He verbalizes understanding to

## 2013-10-16 NOTE — Patient Instructions (Addendum)
Your physician recommends that you schedule a follow-up appointment in:  2 months (POST ECHO) WITH DR.COOPER  Your physician has requested that you have an echocardiogram. Echocardiography is a painless test that uses sound waves to create images of your heart. It provides your doctor with information about the size and shape of your heart and how well your heart's chambers and valves are working. This procedure takes approximately one hour. There are no restrictions for this procedure.6 WEEKS  Your physician recommends that you return for lab work in: 2 WEEKS (SLIPS GIVEN FOR BMET)   WE WILL CALL YOU WITH YOUR TEST RESULTS/INSTRUCTIONS/NEXT STEPS ONCE RECEIVED BY THE PROVIDER   Your physician recommends THAT YOU NEED TO CONTINUE WEARING YOUR LIFE VEST AT ALL TIMES AND Your physician recommends that you continue on your current medications as directed. Please refer to the Current Medication list given to you today.

## 2013-10-16 NOTE — Progress Notes (Addendum)
HPI: Gary [name] is a 62 year old patient of Dr. Excell Seltzer, with history of non-ST elevation MI, status post balloon angioplasty of the diagonal one, with dissection but flow restored with prolonged balloon inflation. Also has a history of ventricular tachycardia, ischemic cardiomyopathy with an EF of 30% discharge was a life vest, hyperlipidemia (intolerant to statins), ongoing tobacco abuse, and hypertension. The patient was discharged from the hospital on 10/08/2013, placed on Coreg, irbesartan, and Zetia. Patient wishes to follow with Dr. Excell Seltzer in [place], but is willing to be seen posthospitalization in our office today. He remains on DAPT with ticagrelor BID and ASA.  The patient is without complaint today. He has been walking in his home. He is been contacted by cardiac rehabilitation and will begin in January 2015. He is medically compliant. He has stopped smoking. Allergies  Allergen Reactions  . Morphine And Related     Extreme agitation (pulls out IVs)  . Statins     Causes muscle fatigue    Current Outpatient Prescriptions  Medication Sig Dispense Refill  . acetaminophen (TYLENOL) 500 MG tablet Take 500 mg by mouth every 6 (six) hours as needed for moderate pain.      Marland Kitchen ALPRAZolam (XANAX) 0.5 MG tablet Take 0.25 mg by mouth daily as needed for anxiety.      Marland Kitchen aspirin EC 81 MG EC tablet Take 1 tablet (81 mg total) by mouth daily.      . carvedilol (COREG) 6.25 MG tablet Take 1 tablet (6.25 mg total) by mouth 2 (two) times daily with a meal.  60 tablet  6  . ezetimibe (ZETIA) 10 MG tablet Take 1 tablet (10 mg total) by mouth daily.  30 tablet  6  . feeding supplement (BOOST HIGH PROTEIN) LIQD Take 1 Container by mouth daily.      Marland Kitchen losartan (COZAAR) 50 MG tablet Take 1 tablet (50 mg total) by mouth at bedtime.  30 tablet  6  . nitroGLYCERIN (NITROSTAT) 0.4 MG SL tablet Place 1 tablet (0.4 mg total) under the tongue every 5 (five) minutes as needed for chest pain (up to 3 doses).  25  tablet  3  . Ticagrelor (BRILINTA) 90 MG TABS tablet Take 1 tablet (90 mg total) by mouth 2 (two) times daily.  60 tablet  10   No current facility-administered medications for this visit.    Past Medical History  Diagnosis Date  . Hypertension   . Hyperlipidemia     a. intolerant to statins. b. Started on Zetia 09/2013.  Marland Kitchen Anxiety   . Broken neck   . CAD (coronary artery disease)     a. NSTEMI 09/2013: - s/p balloon angioplasty of D1, c/b dissection but flow restored with prolonged balloon inflations.  . VT (ventricular tachycardia)     a. On admission with NSTEMI 09/2013.  . Ischemic cardiomyopathy     a. 09/2013: EF 30%, d/c with Lifevest.  . Tobacco abuse   . Mitral regurgitation     a. Mild by echo 09/2013.  Marland Kitchen Protein calorie malnutrition     History reviewed. No pertinent past surgical history.  ROS: no other symptoms  PHYSICAL EXAM BP 149/82  Pulse 96  Ht 5\' 6"  (1.676 m)  Wt 115 lb (52.164 kg)  BMI 18.57 kg/m2  General: Well developed, well nourished, in no acute distress Head: Eyes PERRLA, No xanthomas.   Normal cephalic and atramatic  Lungs: Clear bilaterally to auscultation and percussion. Heart: HRRR S1 S2, without MRG.  Pulses are 2+ & equal.            No carotid bruit. No JVD.  No abdominal bruits. No femoral bruits. Abdomen: Bowel sounds are positive, abdomen soft and non-tender without masses or                  Hernia's noted. Msk:  Back normal, normal gait. Normal strength and tone for age. Extremities: No clubbing, cyanosis or edema.  DP +1 Neuro: Alert and oriented X 3. Psych:  Good affect, responds appropriately   ASSESSMENT AND PLAN

## 2013-11-04 LAB — BASIC METABOLIC PANEL
Chloride: 100 mEq/L (ref 96–112)
Glucose, Bld: 87 mg/dL (ref 70–99)
Potassium: 4.8 mEq/L (ref 3.5–5.3)
Sodium: 140 mEq/L (ref 135–145)

## 2013-11-05 ENCOUNTER — Encounter: Payer: Self-pay | Admitting: *Deleted

## 2013-11-25 ENCOUNTER — Other Ambulatory Visit (HOSPITAL_COMMUNITY): Payer: Self-pay | Admitting: Cardiovascular Disease

## 2013-11-25 DIAGNOSIS — I519 Heart disease, unspecified: Secondary | ICD-10-CM

## 2013-11-26 ENCOUNTER — Ambulatory Visit (HOSPITAL_COMMUNITY): Payer: BC Managed Care – PPO | Attending: Cardiovascular Disease | Admitting: Radiology

## 2013-11-26 ENCOUNTER — Other Ambulatory Visit: Payer: Self-pay

## 2013-11-26 ENCOUNTER — Encounter: Payer: Self-pay | Admitting: Cardiovascular Disease

## 2013-11-26 DIAGNOSIS — I10 Essential (primary) hypertension: Secondary | ICD-10-CM | POA: Insufficient documentation

## 2013-11-26 DIAGNOSIS — I472 Ventricular tachycardia, unspecified: Secondary | ICD-10-CM | POA: Insufficient documentation

## 2013-11-26 DIAGNOSIS — I214 Non-ST elevation (NSTEMI) myocardial infarction: Secondary | ICD-10-CM

## 2013-11-26 DIAGNOSIS — I252 Old myocardial infarction: Secondary | ICD-10-CM | POA: Insufficient documentation

## 2013-11-26 DIAGNOSIS — I4729 Other ventricular tachycardia: Secondary | ICD-10-CM | POA: Insufficient documentation

## 2013-11-26 DIAGNOSIS — F17200 Nicotine dependence, unspecified, uncomplicated: Secondary | ICD-10-CM | POA: Insufficient documentation

## 2013-11-26 DIAGNOSIS — I2589 Other forms of chronic ischemic heart disease: Secondary | ICD-10-CM | POA: Insufficient documentation

## 2013-11-26 DIAGNOSIS — I379 Nonrheumatic pulmonary valve disorder, unspecified: Secondary | ICD-10-CM | POA: Insufficient documentation

## 2013-11-26 DIAGNOSIS — I079 Rheumatic tricuspid valve disease, unspecified: Secondary | ICD-10-CM | POA: Insufficient documentation

## 2013-11-26 DIAGNOSIS — E785 Hyperlipidemia, unspecified: Secondary | ICD-10-CM | POA: Insufficient documentation

## 2013-11-26 DIAGNOSIS — I519 Heart disease, unspecified: Secondary | ICD-10-CM | POA: Insufficient documentation

## 2013-11-26 NOTE — Progress Notes (Signed)
Echocardiogram performed.  

## 2013-12-13 ENCOUNTER — Encounter: Payer: Self-pay | Admitting: Cardiovascular Disease

## 2013-12-13 ENCOUNTER — Ambulatory Visit (INDEPENDENT_AMBULATORY_CARE_PROVIDER_SITE_OTHER): Payer: BC Managed Care – PPO | Admitting: Cardiovascular Disease

## 2013-12-13 VITALS — BP 167/87 | HR 61 | Ht 66.0 in | Wt 124.0 lb

## 2013-12-13 DIAGNOSIS — E7800 Pure hypercholesterolemia, unspecified: Secondary | ICD-10-CM

## 2013-12-13 DIAGNOSIS — I214 Non-ST elevation (NSTEMI) myocardial infarction: Secondary | ICD-10-CM

## 2013-12-13 MED ORDER — CARVEDILOL 12.5 MG PO TABS: 12.5000 mg | ORAL_TABLET | Freq: Two times a day (BID) | ORAL | Status: DC

## 2013-12-13 NOTE — Patient Instructions (Signed)
Your physician has recommended you make the following change in your medication:  Increase Carvedilol to 12.5 mg twice daily  You have been referred to see Dr. Johney Frame with EP here at [facility] on March 4 at 8:30.   Your physician wants you to follow-up in: 3 months with Dr. Excell Seltzer.  You will receive a reminder letter in the mail two months in advance. If you don't receive a letter, please call our office to schedule the follow-up appointment.

## 2013-12-13 NOTE — Progress Notes (Signed)
HPI:  63 year old gentleman presenting for followup evaluation. The patient has a history of non-ST elevation MI November 29 and was noted to have critical stenosis of the first diagonal. He underwent PCI with balloon angioplasty alone. He was diagnosed with a severe cardiomyopathy and left ventricular ejection fraction estimated at 30%. He was discharged from the hospital with a life vest for prevention of sudden cardiac death. He stopped smoking at the time of his event.  The patient has been walking for 30 minutes daily. His biggest issue is related to anxiety. He's been very nervous about his overall condition. He's been taking benzodiazepines and this helps. He has panic attacks. He's had no recurrence of chest pain or pressure. He has mild shortness of breath with exertion. He denies orthopnea, PND, or leg swelling. He's had no lightheadedness or syncope.  Outpatient Encounter Prescriptions as of 12/13/2013  Medication Sig  . acetaminophen (TYLENOL) 500 MG tablet Take 500 mg by mouth every 6 (six) hours as needed for moderate pain.  Marland Kitchen ALPRAZolam (XANAX) 0.5 MG tablet Take by mouth as directed.   Marland Kitchen aspirin EC 81 MG EC tablet Take 1 tablet (81 mg total) by mouth daily.  . carvedilol (COREG) 6.25 MG tablet Take 1 tablet (6.25 mg total) by mouth 2 (two) times daily with a meal.  . ezetimibe (ZETIA) 10 MG tablet Take 1 tablet (10 mg total) by mouth daily.  . feeding supplement (BOOST HIGH PROTEIN) LIQD Take 1 Container by mouth daily.  Marland Kitchen losartan (COZAAR) 50 MG tablet Take 1 tablet (50 mg total) by mouth at bedtime.  . nitroGLYCERIN (NITROSTAT) 0.4 MG SL tablet Place 1 tablet (0.4 mg total) under the tongue every 5 (five) minutes as needed for chest pain (up to 3 doses).  . Ticagrelor (BRILINTA) 90 MG TABS tablet Take 1 tablet (90 mg total) by mouth 2 (two) times daily.    Allergies  Allergen Reactions  . Morphine And Related     Extreme agitation (pulls out IVs)  . Statins     Causes  muscle fatigue    Past Medical History  Diagnosis Date  . Hypertension   . Hyperlipidemia     a. intolerant to statins. b. Started on Zetia 09/2013.  Marland Kitchen Anxiety   . Broken neck   . CAD (coronary artery disease)     a. NSTEMI 09/2013: - s/p balloon angioplasty of D1, c/b dissection but flow restored with prolonged balloon inflations.  . VT (ventricular tachycardia)     a. On admission with NSTEMI 09/2013.  . Ischemic cardiomyopathy     a. 09/2013: EF 30%, d/c with Lifevest.  . Tobacco abuse   . Mitral regurgitation     a. Mild by echo 09/2013.  Marland Kitchen Protein calorie malnutrition     ROS: Negative except as per HPI  BP 167/87  Pulse 61  Ht 5\' 6"  (1.676 m)  Wt 124 lb (56.246 kg)  BMI 20.02 kg/m2  PHYSICAL EXAM: Pt is alert and oriented, thin male in NAD HEENT: normal Neck: JVP - normal, carotids 2+= without bruits Lungs: Distant lung sounds bilaterally, no rales CV: RRR without murmur or gallop, distant Abd: soft, NT, Positive BS, no hepatomegaly Ext: no C/C/E, distal pulses intact and equal Skin: warm/dry no rash  EKG:  Normal sinus rhythm, within normal limits. Heart rate 61 beats per minute.  2-D echocardiogram: Left ventricle: The cavity size was moderately dilated. Wall thickness was normal. The estimated ejection fraction was 30%. Diffuse  hypokinesis.  ------------------------------------------------------------ Aortic valve: Mildly calcified leaflets. Doppler: There was no stenosis. No regurgitation.  ------------------------------------------------------------ Mitral valve: Mildly thickened leaflets . Doppler: Trivial regurgitation.  ------------------------------------------------------------ Left atrium: The atrium was normal in size.  ------------------------------------------------------------ Atrial septum: No defect or patent foramen ovale was identified.  ------------------------------------------------------------ Right ventricle: The cavity size  was normal. Wall thickness was normal. Systolic function was normal.  ------------------------------------------------------------ Pulmonic valve: Structurally normal valve. Cusp separation was normal. Doppler: Transvalvular velocity was within the normal range. Mild regurgitation.  ------------------------------------------------------------ Tricuspid valve: Structurally normal valve. Leaflet separation was normal. Doppler: Transvalvular velocity was within the normal range. Mild regurgitation.  ------------------------------------------------------------ Right atrium: The atrium was normal in size.  ------------------------------------------------------------ Pericardium: The pericardium was normal in appearance.  Cardiac catheterization 10/05/2013: Hemodynamics:  AO 135/67  LV 135/11  Coronary angiography:  Coronary dominance: right  Left mainstem: Widely patent. Arises from left cusp and divides into the LAD and LCx  Left anterior descending (LAD): Patent throughout. The proximal vessel has minor 20% nonobstructive stenosis. The mid and distal vessel are widely patent. The first diagonal has 95% stenosis with associated hypodensity consistent with acute plaque rupture.  Left circumflex (LCx): The LCx is patent. The first OM is patent without disease.  Right coronary artery (RCA): Dominant vessel. No obstructive disease. The PDA and PLA are patent.  Left ventriculography: Left ventricular systolic function is severely reduced. The anterolateral wall is akinetic. The other segments are hypokinetic. The estimated LVEF is 35%.  PCI Note: Following the diagnostic procedure, the decision was made to proceed with PCI. The lesion in the first diagonal is clearly the patient's culprit lesion. It is a true ostial lesion and the diagonal originates at an acute angle from the LAD. Weight-based heparin was given for anticoagulation. Tirofiban was also administered via protocol. Once a  therapeutic ACT was achieved, a 6 [place] XB-LAD guide catheter was inserted. A cougar coronary guidewire was initially attempted but would not cross the lesion. A whisper wire was used to cross the lesion with a moderate amount of difficulty. The lesion was dilated with a 2.5x12 balloon but was difficult to cross. I tried to pass a 2.75 mm balloon but it would not cross. The balloon and wire position was lost. At that point, angiography demonstrated total occlusion of the vessel and suggestion of dissection. I was able to rewire the vessel but had difficulty crossing again with any balloons. I was finally able to cross again with a 1.5x6 balloon then a 1.5 x 20 mm balloon. Prolonged inflations to 10 atmospheres were done with both balloons. Being that this was a true ostial lesion and originated at an acute angulation, I did not think stenting was feasible. The patient had no chest pain throughout the entirety of the procedure. Following PCI, there was 40% residual stenosis and TIMI-3 flow. There was residual dissection plane present. Final angiography confirmed an acceptable result. The patient tolerated the procedure well. A TR band was used for radial hemostasis. The patient was transferred to the post catheterization recovery area for further monitoring.  PCI Data:  Vessel - Diagonal 1/Segment - prox (ostial)  Percent Stenosis (pre) 95  TIMI-flow 3  Stent none  Percent Stenosis (post) 40  TIMI-flow (post) 3  Final Conclusions:  1. 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  2. Wide patency of the LAD, LCx, and RCA  3. Severe segmental LV dysfunction  4. Successful balloon angioplasty of the first diagonal, complicated by dissection but  TIMI-3 flow restored with prolonged balloon inflations (patient chest pain free thorughout  Recommendations:  ASA 81 mg, brilinta x 12 months. Life Vest with severe LV dysfunction/VT.   ASSESSMENT AND PLAN: 1. Severe ischemic cardiomyopathy with  New York Heart Association class II heart failure. Repeat echocardiogram shows severe residual LV dysfunction. While the patient's echocardiogram was ordered within the 12 week window following his MI, I do not think his LV function is going to recover. I recommended that he increase carvedilol to 12.5 mg twice daily. He will continue on losartan 50 mg daily. I will see him back in 3 months at which time I may add spironolactone. Home blood pressures have been running from 112-120 over 70s. He will be referred to electrophysiology for consideration of an ICD. He will continue with his Life Vest until that decision is made. I explained the rationale for consideration of an ICD for primary prevention of sudden cardiac death in the setting of a severe cardiomyopathy.  2. Coronary atherosclerosis, native vessel. No symptoms of recurrent angina. He will continue on aspirin and brilinta.  3. Hyperlipidemia. The patient is statin intolerant. He is taking zetia.  4. Hypertension. As above, home blood pressures are well controlled. His carvedilol will be increased to 12.5 mg twice daily in the setting of his cardiomyopathy.  5. Tobacco: he has quit.  Disposition: I will see him back in 3 months. He will be referred to EP for consideration of a defibrillator. He does physical work. I do not think he will be able to return. Will fill out disability paperwork.  Tonny BollmanMichael Dyland Panuco 12/13/2013 2:18 PM

## 2014-01-06 ENCOUNTER — Telehealth: Payer: Self-pay | Admitting: Cardiovascular Disease

## 2014-01-06 NOTE — Telephone Encounter (Signed)
New problem ° ° ° °Jo with [facility] will be faxing re-newall of pt's LIFE VEST he fax week ago.  Please give him a call when you got them.  °

## 2014-01-08 ENCOUNTER — Ambulatory Visit (INDEPENDENT_AMBULATORY_CARE_PROVIDER_SITE_OTHER): Payer: BC Managed Care – PPO | Admitting: Internal Medicine

## 2014-01-08 ENCOUNTER — Encounter: Payer: Self-pay | Admitting: *Deleted

## 2014-01-08 ENCOUNTER — Encounter: Payer: Self-pay | Admitting: Internal Medicine

## 2014-01-08 VITALS — BP 151/88 | HR 65 | Ht 66.0 in | Wt 127.0 lb

## 2014-01-08 DIAGNOSIS — I10 Essential (primary) hypertension: Secondary | ICD-10-CM

## 2014-01-08 DIAGNOSIS — I214 Non-ST elevation (NSTEMI) myocardial infarction: Secondary | ICD-10-CM

## 2014-01-08 DIAGNOSIS — I2589 Other forms of chronic ischemic heart disease: Secondary | ICD-10-CM

## 2014-01-08 DIAGNOSIS — I519 Heart disease, unspecified: Secondary | ICD-10-CM

## 2014-01-08 DIAGNOSIS — I2510 Atherosclerotic heart disease of native coronary artery without angina pectoris: Secondary | ICD-10-CM

## 2014-01-08 DIAGNOSIS — I472 Ventricular tachycardia, unspecified: Secondary | ICD-10-CM

## 2014-01-08 DIAGNOSIS — I255 Ischemic cardiomyopathy: Secondary | ICD-10-CM

## 2014-01-08 DIAGNOSIS — I4729 Other ventricular tachycardia: Secondary | ICD-10-CM

## 2014-01-08 LAB — BASIC METABOLIC PANEL
BUN: 15 mg/dL (ref 6–23)
CHLORIDE: 104 meq/L (ref 96–112)
CO2: 28 mEq/L (ref 19–32)
CREATININE: 0.8 mg/dL (ref 0.4–1.5)
Calcium: 9.4 mg/dL (ref 8.4–10.5)
GFR: 107.1 mL/min (ref >60.00)
Glucose, Bld: 100 mg/dL — ABNORMAL HIGH (ref 70–99)
POTASSIUM: 4.4 meq/L (ref 3.5–5.1)
Sodium: 139 mEq/L (ref 135–145)

## 2014-01-08 LAB — CBC WITH DIFFERENTIAL/PLATELET
BASOS PCT: 0.3 % (ref 0.0–3.0)
Basophils Absolute: 0 10*3/uL (ref 0.0–0.1)
Eosinophils Absolute: 0.3 10*3/uL (ref 0.0–0.7)
Eosinophils Relative: 2.3 % (ref 0.0–5.0)
HCT: 44.7 % (ref 39.0–52.0)
Hemoglobin: 14.9 g/dL (ref 13.0–17.0)
Lymphocytes Relative: 14.6 % (ref 12.0–46.0)
Lymphs Abs: 1.6 10*3/uL (ref 0.7–4.0)
MCHC: 33.3 g/dL (ref 30.0–36.0)
MCV: 95.3 fl (ref 78.0–100.0)
MONO ABS: 0.9 10*3/uL (ref 0.1–1.0)
Monocytes Relative: 8.5 % (ref 3.0–12.0)
Neutro Abs: 8.2 10*3/uL — ABNORMAL HIGH (ref 1.4–7.7)
Neutrophils Relative %: 74.3 % (ref 43.0–77.0)
Platelets: 233 10*3/uL (ref 150.0–400.0)
RBC: 4.7 Mil/uL (ref 4.22–5.81)
RDW: 14.4 % (ref 11.5–14.6)
WBC: 11 10*3/uL — AB (ref 4.5–10.5)

## 2014-01-08 MED ORDER — CLOPIDOGREL BISULFATE 75 MG PO TABS: 75.0000 mg | ORAL_TABLET | Freq: Every day | ORAL | Status: DC

## 2014-01-08 NOTE — Telephone Encounter (Signed)
Patient is seeing Dr Johney Frame today and decision will be made as to Vest.  I have let Alvino Chapel know and will have Dr Excell Seltzer sign the re-order for appropriate dates.  Will let him know when this faxed

## 2014-01-08 NOTE — Patient Instructions (Addendum)
Your physician has recommended that you have a defibrillator inserted. An implantable cardioverter defibrillator (ICD) is a small device that is placed in your chest or, in rare cases, your abdomen. This device uses electrical pulses or shocks to help control life-threatening, irregular heartbeats that could lead the heart to suddenly stop beating (sudden cardiac arrest). Leads are attached to the ICD that goes into your heart. This is done in the hospital and usually requires an overnight stay. Please see the instruction sheet given to you today for more information.  Your physician has recommended you make the following change in your medication:  1) Stop Brilinta 2) Start Plavix 75mg  ---take 300mg  tonight(4 pills) then starting 01/09/14 take 1 tablet daily

## 2014-01-09 ENCOUNTER — Encounter (HOSPITAL_COMMUNITY): Payer: Self-pay | Admitting: Pharmacy Technician

## 2014-01-09 NOTE — Telephone Encounter (Signed)
Spoke with patient's wife, Nettie Elm who requested Dr. Earmon Phoenix and Dr. Jenel Lucks last office visit notes be faxed to [facility] for disability.  I faxed documents to [phone].  Confirmation received

## 2014-01-09 NOTE — Telephone Encounter (Signed)
New message    Wife calling have some questions & concerns will explain when the nurse call back

## 2014-01-13 ENCOUNTER — Telehealth: Payer: Self-pay | Admitting: Cardiovascular Disease

## 2014-01-13 DIAGNOSIS — I255 Ischemic cardiomyopathy: Secondary | ICD-10-CM | POA: Insufficient documentation

## 2014-01-13 DIAGNOSIS — I472 Ventricular tachycardia, unspecified: Secondary | ICD-10-CM | POA: Insufficient documentation

## 2014-01-13 DIAGNOSIS — I2510 Atherosclerotic heart disease of native coronary artery without angina pectoris: Secondary | ICD-10-CM | POA: Insufficient documentation

## 2014-01-13 DIAGNOSIS — I10 Essential (primary) hypertension: Secondary | ICD-10-CM | POA: Insufficient documentation

## 2014-01-13 NOTE — Telephone Encounter (Signed)
Faxed again

## 2014-01-13 NOTE — Progress Notes (Signed)
Primary Care Physician: Gary [name], [name], DO Referring Physician:  Dr Gary [name]   Gary [name] is a 63 y.o. male with a h/o ischemic CM (EF 30%), NYHA Class III CHF, and prior VT who presents for EP consultation regarding risks of sudden death.  He presented with non-ST elevation MI November 29 and was noted to have critical stenosis of the first diagonal. He underwent PCI with balloon angioplasty alone. He was diagnosed with a severe cardiomyopathy and left ventricular ejection fraction estimated at 30%. He was observed to have VT in the hopsital and was discharged with a life vest for prevention of sudden cardiac death. He stopped smoking at the time of his event.  The patient has been walking for 30 minutes daily.   He reports shortness of breath with exertion. He denies orthopnea, PND, or leg swelling. Today, he denies symptoms of palpitations, chest pain,  dizziness, presyncope, syncope, or neurologic sequela. The patient is tolerating medications without difficulties and is otherwise without complaint today.   Past Medical History  Diagnosis Date  . Hypertension   . Hyperlipidemia     a. intolerant to statins. b. Started on Zetia 09/2013.  Marland Kitchen. Anxiety   . Broken neck   . CAD (coronary artery disease)     a. NSTEMI 09/2013: - s/p balloon angioplasty of D1, c/b dissection but flow restored with prolonged balloon inflations.  . VT (ventricular tachycardia)     a. On admission with NSTEMI 09/2013.  . Ischemic cardiomyopathy     a. 09/2013: EF 30%, d/c with Lifevest.  . Tobacco abuse   . Mitral regurgitation     a. Mild by echo 09/2013.  Marland Kitchen. Protein calorie malnutrition    No past surgical history on file.  Current Outpatient Prescriptions  Medication Sig Dispense Refill  . acetaminophen (TYLENOL) 500 MG tablet Take 500 mg by mouth every 6 (six) hours as needed for moderate pain.      Marland Kitchen. aspirin EC 81 MG EC tablet Take 1 tablet (81 mg total) by mouth daily.      . carvedilol (COREG) 25 MG  tablet Take 25 mg by mouth 2 (two) times daily with a meal.      . ezetimibe (ZETIA) 10 MG tablet Take 1 tablet (10 mg total) by mouth daily.  30 tablet  6  . feeding supplement (BOOST HIGH PROTEIN) LIQD Take 1 Container by mouth daily.      Marland Kitchen. LORazepam (ATIVAN) 0.5 MG tablet Take 0.5 mg by mouth every 8 (eight) hours.      Marland Kitchen. losartan (COZAAR) 50 MG tablet Take 1 tablet (50 mg total) by mouth at bedtime.  30 tablet  6  . nitroGLYCERIN (NITROSTAT) 0.4 MG SL tablet Place 1 tablet (0.4 mg total) under the tongue every 5 (five) minutes as needed for chest pain (up to 3 doses).  25 tablet  3  . clopidogrel (PLAVIX) 75 MG tablet Take 1 tablet (75 mg total) by mouth daily.  90 tablet  3   No current facility-administered medications for this visit.    Allergies  Allergen Reactions  . Morphine And Related     Extreme agitation (pulls out IVs)  . Statins     Causes muscle fatigue    History   Social History  . Marital Status: Married    Spouse Name: N/A    Number of Children: N/A  . Years of Education: N/A   Occupational History  . Not on file.   Social History  Main Topics  . Smoking status: Former Smoker    Types: Cigarettes  . Smokeless tobacco: Not on file     Comment: 10-04-2013  . Alcohol Use: No  . Drug Use: No  . Sexual Activity: Not on file   Other Topics Concern  . Not on file   Social History Narrative  . No narrative on file    No family history on file.  ROS- All systems are reviewed and negative except as per the HPI above  Physical Exam: Filed Vitals:   01/08/14 0828  BP: 151/88  Pulse: 65  Height: 5\' 6"  (1.676 m)  Weight: 127 lb (57.607 kg)    GEN- The patient is well appearing, alert and oriented x 3 today.   Head- normocephalic, atraumatic Eyes-  Sclera clear, conjunctiva pink Ears- hearing intact Oropharynx- clear Neck- supple, no JVP Lymph- no cervical lymphadenopathy Lungs- Clear to ausculation bilaterally, normal work of breathing Heart-  Regular rate and rhythm, no murmurs, rubs or gallops, PMI not laterally displaced GI- soft, NT, ND, + BS Extremities- no clubbing, cyanosis, or edema MS- no significant deformity or atrophy Skin- no rash or lesion Psych- euthymic mood, full affect Neuro- strength and sensation are intact  EKG today reveals sinus rhythm 65 bpm, PR 174, QRS 100 msec, Qtc 386 msec Echo 12/06/13- EF 30% with diffuse hypokinesis, LVEDD 6mm Dr Gary [name] notes are reviewed Epic notes including prior hospitalization also reviewed Limited echo today reviewed by me reveals that EF remains 30-35%.  Assessment and Plan:  1. The patient has an ischemic CM (EF 30%), NYHA Class III CHF, and CAD. Today, he is > 90 days post revascularization and has been treated with an optimal medical regimen.  We performed a limited echo in the office today which I personally read which reveals that his EF remains 30-35%.  At this time, he meets MADIT II/ SCD-HeFT criteria for ICD implantation for primary prevention of sudden death.  His QRS is <130 msec and therefore he does not qualify for CRT.  Risks, benefits, alternatives to ICD implantation were discussed in detail with the patient today. The patient  understands that the risks include but are not limited to bleeding, infection, pneumothorax, perforation, tamponade, vascular damage, renal failure, MI, stroke, death, inappropriate shocks, and lead dislodgement and wishes to proceed.  We will therefore schedule device implantation at the next available time.  2. HTN Stable No change required today  3. CAD Stable No change required today  4. VT No further episodes lifevest remains in place

## 2014-01-13 NOTE — Telephone Encounter (Signed)
New message         Have you received a renewal order for life vests for this pt?

## 2014-01-16 MED ORDER — SODIUM CHLORIDE 0.9 % IR SOLN
80.0000 mg | Status: DC
  Filled 2014-01-16: qty 2

## 2014-01-16 MED ORDER — CEFAZOLIN SODIUM-DEXTROSE 2-3 GM-% IV SOLR
2.0000 g | INTRAVENOUS | Status: DC
  Filled 2014-01-16: qty 50

## 2014-01-16 MED ORDER — SODIUM CHLORIDE 0.9 % IV SOLN
INTRAVENOUS | Status: DC
  Administered 2014-01-17: 11:00:00 via INTRAVENOUS

## 2014-01-16 MED ORDER — CHLORHEXIDINE GLUCONATE 4 % EX LIQD
60.0000 mL | Freq: Once | CUTANEOUS | Status: DC
  Filled 2014-01-16: qty 60

## 2014-01-17 ENCOUNTER — Ambulatory Visit (HOSPITAL_COMMUNITY)
Admission: RE | Admit: 2014-01-17 | Discharge: 2014-01-18 | Disposition: A | Discharge status: Home / Self Care | Payer: BC Managed Care – PPO | Source: Ambulatory Visit | Attending: Internal Medicine | Admitting: Internal Medicine

## 2014-01-17 ENCOUNTER — Encounter (HOSPITAL_COMMUNITY): Payer: Self-pay | Admitting: General Practice

## 2014-01-17 ENCOUNTER — Encounter (HOSPITAL_COMMUNITY)
Admission: RE | Disposition: A | Discharge status: Home / Self Care | Payer: Self-pay | Source: Ambulatory Visit | Attending: Internal Medicine

## 2014-01-17 DIAGNOSIS — I4729 Other ventricular tachycardia: Secondary | ICD-10-CM | POA: Insufficient documentation

## 2014-01-17 DIAGNOSIS — I472 Ventricular tachycardia, unspecified: Secondary | ICD-10-CM | POA: Diagnosis present

## 2014-01-17 DIAGNOSIS — Z885 Allergy status to narcotic agent status: Secondary | ICD-10-CM | POA: Insufficient documentation

## 2014-01-17 DIAGNOSIS — I255 Ischemic cardiomyopathy: Secondary | ICD-10-CM | POA: Diagnosis present

## 2014-01-17 DIAGNOSIS — I2589 Other forms of chronic ischemic heart disease: Secondary | ICD-10-CM

## 2014-01-17 DIAGNOSIS — F411 Generalized anxiety disorder: Secondary | ICD-10-CM | POA: Insufficient documentation

## 2014-01-17 DIAGNOSIS — Z87891 Personal history of nicotine dependence: Secondary | ICD-10-CM | POA: Insufficient documentation

## 2014-01-17 DIAGNOSIS — I252 Old myocardial infarction: Secondary | ICD-10-CM | POA: Insufficient documentation

## 2014-01-17 DIAGNOSIS — Z7902 Long term (current) use of antithrombotics/antiplatelets: Secondary | ICD-10-CM | POA: Insufficient documentation

## 2014-01-17 DIAGNOSIS — I214 Non-ST elevation (NSTEMI) myocardial infarction: Secondary | ICD-10-CM

## 2014-01-17 DIAGNOSIS — Z7982 Long term (current) use of aspirin: Secondary | ICD-10-CM | POA: Insufficient documentation

## 2014-01-17 DIAGNOSIS — Z95810 Presence of automatic (implantable) cardiac defibrillator: Secondary | ICD-10-CM

## 2014-01-17 DIAGNOSIS — I10 Essential (primary) hypertension: Secondary | ICD-10-CM | POA: Diagnosis present

## 2014-01-17 DIAGNOSIS — I059 Rheumatic mitral valve disease, unspecified: Secondary | ICD-10-CM | POA: Insufficient documentation

## 2014-01-17 DIAGNOSIS — I519 Heart disease, unspecified: Secondary | ICD-10-CM

## 2014-01-17 DIAGNOSIS — I509 Heart failure, unspecified: Secondary | ICD-10-CM | POA: Insufficient documentation

## 2014-01-17 DIAGNOSIS — E46 Unspecified protein-calorie malnutrition: Secondary | ICD-10-CM | POA: Insufficient documentation

## 2014-01-17 DIAGNOSIS — Z4502 Encounter for adjustment and management of automatic implantable cardiac defibrillator: Secondary | ICD-10-CM | POA: Insufficient documentation

## 2014-01-17 DIAGNOSIS — E785 Hyperlipidemia, unspecified: Secondary | ICD-10-CM | POA: Insufficient documentation

## 2014-01-17 DIAGNOSIS — I2510 Atherosclerotic heart disease of native coronary artery without angina pectoris: Secondary | ICD-10-CM | POA: Diagnosis present

## 2014-01-17 HISTORY — PX: IMPLANTABLE CARDIOVERTER DEFIBRILLATOR IMPLANT: SHX5860

## 2014-01-17 HISTORY — DX: Unspecified osteoarthritis, unspecified site: M19.90

## 2014-01-17 HISTORY — PX: IMPLANTABLE CARDIOVERTER DEFIBRILLATOR IMPLANT: SHX5473

## 2014-01-17 LAB — SURGICAL PCR SCREEN
MRSA, PCR: NEGATIVE
Staphylococcus aureus: NEGATIVE

## 2014-01-17 SURGERY — IMPLANTABLE CARDIOVERTER DEFIBRILLATOR IMPLANT
Anesthesia: LOCAL

## 2014-01-17 MED ORDER — ASPIRIN EC 81 MG PO TBEC
81.0000 mg | DELAYED_RELEASE_TABLET | Freq: Every day | ORAL | Status: DC
  Filled 2014-01-17: qty 1

## 2014-01-17 MED ORDER — ACETAMINOPHEN 325 MG PO TABS
325.0000 mg | ORAL_TABLET | ORAL | Status: DC | PRN
  Administered 2014-01-17: 650 mg via ORAL
  Filled 2014-01-17: qty 2

## 2014-01-17 MED ORDER — MIDAZOLAM HCL 5 MG/5ML IJ SOLN
INTRAMUSCULAR | Status: AC
  Filled 2014-01-17: qty 5

## 2014-01-17 MED ORDER — SODIUM CHLORIDE 0.9 % IJ SOLN
3.0000 mL | Freq: Two times a day (BID) | INTRAMUSCULAR | Status: DC
  Administered 2014-01-17 – 2014-01-18 (×2): 3 mL via INTRAVENOUS

## 2014-01-17 MED ORDER — FENTANYL CITRATE 0.05 MG/ML IJ SOLN
INTRAMUSCULAR | Status: AC
  Filled 2014-01-17: qty 2

## 2014-01-17 MED ORDER — HYDROCODONE-ACETAMINOPHEN 5-325 MG PO TABS
1.0000 | ORAL_TABLET | ORAL | Status: DC | PRN
  Administered 2014-01-17 – 2014-01-18 (×2): 2 via ORAL
  Administered 2014-01-18: 1 via ORAL
  Filled 2014-01-17 (×3): qty 2

## 2014-01-17 MED ORDER — SENNA 8.6 MG PO TABS
1.0000 | ORAL_TABLET | ORAL | Status: DC
  Administered 2014-01-18: 8.6 mg via ORAL
  Filled 2014-01-17: qty 1

## 2014-01-17 MED ORDER — MUPIROCIN 2 % EX OINT
TOPICAL_OINTMENT | CUTANEOUS | Status: AC
  Administered 2014-01-17: 1
  Filled 2014-01-17: qty 22

## 2014-01-17 MED ORDER — LORAZEPAM 0.5 MG PO TABS
0.5000 mg | ORAL_TABLET | Freq: Three times a day (TID) | ORAL | Status: DC
  Administered 2014-01-17 – 2014-01-18 (×3): 0.5 mg via ORAL
  Filled 2014-01-17 (×3): qty 1

## 2014-01-17 MED ORDER — BOOST HIGH PROTEIN PO LIQD
1.0000 | ORAL | Status: DC
  Administered 2014-01-17: 1 via ORAL
  Filled 2014-01-17 (×3): qty 1

## 2014-01-17 MED ORDER — NITROGLYCERIN 0.4 MG SL SUBL: 0.4000 mg | SUBLINGUAL_TABLET | SUBLINGUAL | Status: DC | PRN

## 2014-01-17 MED ORDER — CARVEDILOL 25 MG PO TABS
25.0000 mg | ORAL_TABLET | Freq: Two times a day (BID) | ORAL | Status: DC
  Administered 2014-01-17 – 2014-01-18 (×2): 25 mg via ORAL
  Filled 2014-01-17 (×4): qty 1

## 2014-01-17 MED ORDER — LOSARTAN POTASSIUM 50 MG PO TABS
50.0000 mg | ORAL_TABLET | Freq: Every day | ORAL | Status: DC
  Administered 2014-01-17: 50 mg via ORAL
  Filled 2014-01-17 (×2): qty 1

## 2014-01-17 MED ORDER — LIDOCAINE HCL (PF) 1 % IJ SOLN
INTRAMUSCULAR | Status: AC
  Filled 2014-01-17: qty 60

## 2014-01-17 MED ORDER — ASPIRIN 81 MG PO CHEW
81.0000 mg | CHEWABLE_TABLET | Freq: Every day | ORAL | Status: DC
  Administered 2014-01-17 – 2014-01-18 (×2): 81 mg via ORAL
  Filled 2014-01-17 (×2): qty 1

## 2014-01-17 MED ORDER — EZETIMIBE 10 MG PO TABS
10.0000 mg | ORAL_TABLET | Freq: Every day | ORAL | Status: DC
Start: 2014-01-17 — End: 2014-01-18
  Administered 2014-01-18: 10 mg via ORAL
  Filled 2014-01-17 (×2): qty 1

## 2014-01-17 MED ORDER — MUPIROCIN 2 % EX OINT: TOPICAL_OINTMENT | Freq: Two times a day (BID) | CUTANEOUS | Status: DC

## 2014-01-17 MED ORDER — SODIUM CHLORIDE 0.9 % IV SOLN: 250.0000 mL | INTRAVENOUS | Status: DC | PRN

## 2014-01-17 MED ORDER — CLOPIDOGREL BISULFATE 75 MG PO TABS
75.0000 mg | ORAL_TABLET | Freq: Every day | ORAL | Status: DC
  Administered 2014-01-17: 75 mg via ORAL
  Filled 2014-01-17: qty 1

## 2014-01-17 MED ORDER — CEFAZOLIN SODIUM 1-5 GM-% IV SOLN
1.0000 g | Freq: Four times a day (QID) | INTRAVENOUS | Status: AC
  Administered 2014-01-17 – 2014-01-18 (×3): 1 g via INTRAVENOUS
  Filled 2014-01-17 (×3): qty 50

## 2014-01-17 MED ORDER — SODIUM CHLORIDE 0.9 % IJ SOLN: 3.0000 mL | INTRAMUSCULAR | Status: DC | PRN

## 2014-01-17 MED ORDER — ONDANSETRON HCL 4 MG/2ML IJ SOLN: 4.0000 mg | Freq: Four times a day (QID) | INTRAMUSCULAR | Status: DC | PRN

## 2014-01-17 NOTE — Interval H&P Note (Signed)
History and Physical Interval Note:  01/17/2014 12:22 PM  Gary [name]  has presented today for surgery, with the diagnosis of chf/LV disfunction  The various methods of treatment have been discussed with the patient and family. After consideration of risks, benefits and other options for treatment, the patient has consented to  Procedure(s): IMPLANTABLE CARDIOVERTER DEFIBRILLATOR IMPLANT (N/A) as a surgical intervention .  The patient's history has been reviewed, patient examined, no change in status, stable for surgery.  I have reviewed the patient's chart and labs.  Questions were answered to the patient's satisfaction.     ICD Criteria  Current LVEF: 30% ;Obtained < 1 month ago.  NYHA Functional Classification: Class III  Heart Failure History:  Yes, Duration of heart failure since onset is 3 to 9 months  Non-Ischemic Dilated Cardiomyopathy History:  No.  Atrial Fibrillation/Atrial Flutter:  No.  Ventricular Tachycardia History:  Yes, Hemodynamic instability present, VT Type:  SVT - Monomorphic.  Cardiac Arrest History:  No  History of Syndromes with Risk of Sudden Death:  No.  Previous ICD:  No.  Electrophysiology Study: No.  Prior MI: Yes, Most recent MI timeframe is > 40 days.  PPM: No.  OSA:  No  Patient Life Expectancy of >=1 year: Yes.  Anticoagulation Therapy:  Patient is NOT on anticoagulation therapy.   Beta Blocker Therapy:  Yes.   Ace Inhibitor/ARB Therapy:  Yes.  Hillis Range

## 2014-01-17 NOTE — Op Note (Signed)
SURGEON:  Hillis RangeJames Kelsen Celona, MD      PREPROCEDURE DIAGNOSES:   1. Ischemic cardiomyopathy.   2. New York Heart Association class III, heart failure chronically.   3. Ventricular tachycardia     POSTPROCEDURE DIAGNOSES:   1. Ischemic cardiomyopathy.   2. New York Heart Association class III heart failure chronically.   3. Ventricular tachycardia     PROCEDURES:    1. ICD implantation.  2. Defibrillation threshold testing     INTRODUCTION: Gary [name] is a 63 y.o. male with an ischemic CM (EF 30%), NYHA Class III CHF, and CAD. At this time, he meets MADIT II/ SCD-HeFT criteria for ICD implantation for primary prevention of sudden death.  The patient has a narrow QRS and does not meet criteria for revascularization.  The patient has been treated with an optimal medical regimen but continues to have a depressed ejection fraction and NYHA Class III CHF symptoms.  The patient therefore  presents today for ICD implantation.      DESCRIPTION OF PROCEDURE:  Informed written consent was obtained and the patient was brought to the electrophysiology lab in the fasting state. The patient was adequately sedated with intravenous Versed, and fentanyl as outlined in the nursing report.  The patient's left chest was prepped and draped in the usual sterile fashion by the EP lab staff.  The skin overlying the left deltopectoral region was infiltrated with lidocaine for local analgesia.  A 5-cm incision was made over the left deltopectoral region.  A left subcutaneous defibrillator pocket was fashioned using a combination of sharp and blunt dissection.  Electrocautery was used to assure hemostasis.   RA/RV Lead Placement: The left axillary vein was cannulated with fluoroscopic visualization.  No contrast was required for this endeavor.  Through the left axillary vein, a [employer] model [id number]  (serial # [id number]) right atrial lead and a [employer] [place], model [id number]-[id number]-[id number] (serial number [id number]) right  ventricular defibrillator lead were advanced with fluoroscopic visualization into the right atrial appendage and right ventricular apex positions respectively.  Initial atrial lead P-waves measured 2.4 mV with an impedance of 566 ohms and a threshold of 0.5 volts at 0.5 milliseconds.  The right ventricular lead R-wave measured 9.8 mV with impedance of 1200 ohms and a threshold of 1.2 volts at 0.5 milliseconds.   The leads were secured to the pectoralis  fascia using #2 silk suture over the suture sleeves.  The pocket then  irrigated with copious gentamicin solution.  The leads were then connected to a [address] [address] model [id number] (serial  Number [id number]) ICD.  The defibrillator was placed into the  pocket.  The pocket was then closed in 2 layers with 2.0 Vicryl suture  for the subcutaneous and subcuticular layers.  Steri-Strips and a  sterile dressing were then applied.   DFT Testing: Defibrillation Threshold testing was then performed. Ventricular fibrillation was induced with a T shock.  Adequate sensing of ventricular  fibrillation was observed with minimal dropout with a programmed sensitivity of 1.660mV.  The patient was successfully defibrillated to sinus rhythm with a single 15 joules shock delivered from the device with an impedance of 73 ohms in a duration of 4.0 seconds.  The patient remained in sinus rhythm thereafter.  There were no early apparent complications.  Programmed Extrastimulus testing:  Programmed extrastimulus testing was performed through the device with a basic cycle length of 500msec with S1,S2,S3,S4 extrastimuli down to refractoriness (500/250/230/200 msec) with no  sustained VT or VF observed.  The procedure was therefore considered completed.  There were no early apparhent complications.     CONCLUSIONS:   1. Ischemic cardiomyopathy with chronic New York Heart Association class III heart failure and prior VT.   2. Successful ICD implantation.   3. DFT less than  or equal to 15 joules.   4. No inducible VT or VF with PES  5. No early apparent complications.

## 2014-01-17 NOTE — H&P (View-Only) (Signed)
Primary Care Physician: [name], [name], DO Referring Physician:  Dr Cooper   Gary [name] is a 63 y.o. male with a h/o ischemic CM (EF 30%), NYHA Class III CHF, and prior VT who presents for EP consultation regarding risks of sudden death.  He presented with non-ST elevation MI November 29 and was noted to have critical stenosis of the first diagonal. He underwent PCI with balloon angioplasty alone. He was diagnosed with a severe cardiomyopathy and left ventricular ejection fraction estimated at 30%. He was observed to have VT in the hopsital and was discharged with a life vest for prevention of sudden cardiac death. He stopped smoking at the time of his event.  The patient has been walking for 30 minutes daily.   He reports shortness of breath with exertion. He denies orthopnea, PND, or leg swelling. Today, he denies symptoms of palpitations, chest pain,  dizziness, presyncope, syncope, or neurologic sequela. The patient is tolerating medications without difficulties and is otherwise without complaint today.   Past Medical History  Diagnosis Date  . Hypertension   . Hyperlipidemia     a. intolerant to statins. b. Started on Zetia 09/2013.  . Anxiety   . Broken neck   . CAD (coronary artery disease)     a. NSTEMI 09/2013: - s/p balloon angioplasty of D1, c/b dissection but flow restored with prolonged balloon inflations.  . VT (ventricular tachycardia)     a. On admission with NSTEMI 09/2013.  . Ischemic cardiomyopathy     a. 09/2013: EF 30%, d/c with Lifevest.  . Tobacco abuse   . Mitral regurgitation     a. Mild by echo 09/2013.  . Protein calorie malnutrition    No past surgical history on file.  Current Outpatient Prescriptions  Medication Sig Dispense Refill  . acetaminophen (TYLENOL) 500 MG tablet Take 500 mg by mouth every 6 (six) hours as needed for moderate pain.      . aspirin EC 81 MG EC tablet Take 1 tablet (81 mg total) by mouth daily.      . carvedilol (COREG) 25 MG  tablet Take 25 mg by mouth 2 (two) times daily with a meal.      . ezetimibe (ZETIA) 10 MG tablet Take 1 tablet (10 mg total) by mouth daily.  30 tablet  6  . feeding supplement (BOOST HIGH PROTEIN) LIQD Take 1 Container by mouth daily.      . LORazepam (ATIVAN) 0.5 MG tablet Take 0.5 mg by mouth every 8 (eight) hours.      . losartan (COZAAR) 50 MG tablet Take 1 tablet (50 mg total) by mouth at bedtime.  30 tablet  6  . nitroGLYCERIN (NITROSTAT) 0.4 MG SL tablet Place 1 tablet (0.4 mg total) under the tongue every 5 (five) minutes as needed for chest pain (up to 3 doses).  25 tablet  3  . clopidogrel (PLAVIX) 75 MG tablet Take 1 tablet (75 mg total) by mouth daily.  90 tablet  3   No current facility-administered medications for this visit.    Allergies  Allergen Reactions  . Morphine And Related     Extreme agitation (pulls out IVs)  . Statins     Causes muscle fatigue    History   Social History  . Marital Status: Married    Spouse Name: N/A    Number of Children: N/A  . Years of Education: N/A   Occupational History  . Not on file.   Social History   Main Topics  . Smoking status: Former Smoker    Types: Cigarettes  . Smokeless tobacco: Not on file     Comment: 10-04-2013  . Alcohol Use: No  . Drug Use: No  . Sexual Activity: Not on file   Other Topics Concern  . Not on file   Social History Narrative  . No narrative on file    No family history on file.  ROS- All systems are reviewed and negative except as per the HPI above  Physical Exam: Filed Vitals:   01/08/14 0828  BP: 151/88  Pulse: 65  Height: 5\' 6"  (1.676 m)  Weight: 127 lb (57.607 kg)    GEN- The patient is well appearing, alert and oriented x 3 today.   Head- normocephalic, atraumatic Eyes-  Sclera clear, conjunctiva pink Ears- hearing intact Oropharynx- clear Neck- supple, no JVP Lymph- no cervical lymphadenopathy Lungs- Clear to ausculation bilaterally, normal work of breathing Heart-  Regular rate and rhythm, no murmurs, rubs or gallops, PMI not laterally displaced GI- soft, NT, ND, + BS Extremities- no clubbing, cyanosis, or edema MS- no significant deformity or atrophy Skin- no rash or lesion Psych- euthymic mood, full affect Neuro- strength and sensation are intact  EKG today reveals sinus rhythm 65 bpm, PR 174, QRS 100 msec, Qtc 386 msec Echo 12/06/13- EF 30% with diffuse hypokinesis, LVEDD 6mm Dr Randolm Idol notes are reviewed Epic notes including prior hospitalization also reviewed Limited echo today reviewed by me reveals that EF remains 30-35%.  Assessment and Plan:  1. The patient has an ischemic CM (EF 30%), NYHA Class III CHF, and CAD. Today, he is > 90 days post revascularization and has been treated with an optimal medical regimen.  We performed a limited echo in the office today which I personally read which reveals that his EF remains 30-35%.  At this time, he meets MADIT II/ SCD-HeFT criteria for ICD implantation for primary prevention of sudden death.  His QRS is <130 msec and therefore he does not qualify for CRT.  Risks, benefits, alternatives to ICD implantation were discussed in detail with the patient today. The patient  understands that the risks include but are not limited to bleeding, infection, pneumothorax, perforation, tamponade, vascular damage, renal failure, MI, stroke, death, inappropriate shocks, and lead dislodgement and wishes to proceed.  We will therefore schedule device implantation at the next available time.  2. HTN Stable No change required today  3. CAD Stable No change required today  4. VT No further episodes lifevest remains in place

## 2014-01-17 NOTE — Discharge Summary (Signed)
ELECTROPHYSIOLOGY PROCEDURE DISCHARGE SUMMARY    Patient ID: Gary [name],  MRN: [id number], DOB/AGE: 63-Oct-1952 63 y.o.  Admit date: 01/17/2014 Discharge date: 01/17/2014  Primary Care Physician: Samuel JesterBUTLER, [name], DO Primary Cardiologist: Excell Seltzerooper Electrophysiologist: Allred  Primary Discharge Diagnosis:  Ischemic cardiomyopathy status post ICD implant this admission  Secondary Discharge Diagnosis:  1.  CAD - s/p balloon angioplasty of D1 09/2013 2.  Ventricular tachycardia 3.  Hypertension 4.  Hyperlipidemia  Allergies  Allergen Reactions  . Morphine And Related     Extreme agitation (pulls out IVs)  . Statins     Causes muscle fatigue     Procedures This Admission:  1.  Implantation of a STJ dual chamber ICD on 01-17-2014 by Dr Johney FrameAllred.  The patient received a STJ model number Ellipse DR ICD with model number 2088 right atrial lead and 7122Q right ventricular lead.  DFT's were successful at 15 J.  There were no immediate post procedure complications. 2.  CXR on 01-18-14 demonstrated no pneumothorax status post device implantation.   Brief HPI: Gary BroachJimmy W Varnadore is a 63 y.o. male was referred to electrophysiology in the outpatient setting for consideration of ICD implantation.  Past medical history includes coronary artery disease and ischemic cardiomyopathy.  He had ventricular tachycardia in the setting of NSTEMI in 09/2013 and .  The patient has persistent LV dysfunction despite guideline directed therapy.  Risks, benefits, and alternatives to ICD implantation were reviewed with the patient who wished to proceed.   Hospital Course:  The patient was admitted and underwent implantation of a STJ dual chamber ICD with details as outlined above.   He was monitored on telemetry overnight which demonstrated NSR with brief episodes of atrial fib.  Left chest was without hematoma or ecchymosis.  The device was interrogated and found to be functioning normally.  CXR was obtained and  demonstrated no pneumothorax status post device implantation.  Wound care, arm mobility, and restrictions were reviewed with the patient.  Dr Ladona Ridgelaylor examined the patient and considered them stable for discharge to home.   The patient's discharge medications include an ARB (Losartan) and beta blocker (Carvedilol).   Discharge Vitals: Blood pressure 171/78, pulse 50, temperature 98 F (36.7 C), temperature source Oral, resp. rate 20, height 5\' 6"  (1.676 m), weight 119 lb (53.978 kg), SpO2 99.00%.   Labs:   Lab Results  Component Value Date   WBC 11.0* 01/08/2014   HGB 14.9 01/08/2014   HCT 44.7 01/08/2014   MCV 95.3 01/08/2014   PLT 233.0 01/08/2014   No results found for this basename: NA, K, CL, CO2, BUN, CREATININE, CALCIUM, LABALBU, PROT, BILITOT, ALKPHOS, ALT, AST, GLUCOSE,  in the last 168 hours   Discharge Medications:    Medication List    ASK your doctor about these medications       acetaminophen 500 MG tablet  Commonly known as:  TYLENOL  Take 500 mg by mouth every 6 (six) hours as needed for moderate pain.     aspirin 81 MG EC tablet  Take 1 tablet (81 mg total) by mouth daily.     carvedilol 25 MG tablet  Commonly known as:  COREG  Take 25 mg by mouth 2 (two) times daily with a meal.     clopidogrel 75 MG tablet  Commonly known as:  PLAVIX  Take 1 tablet (75 mg total) by mouth daily.     ezetimibe 10 MG tablet  Commonly known as:  ZETIA  Take 1 tablet (10 mg total) by mouth daily.     feeding supplement Liqd  Take 1 Container by mouth daily.     LORazepam 0.5 MG tablet  Commonly known as:  ATIVAN  Take 0.5 mg by mouth every 8 (eight) hours.     losartan 50 MG tablet  Commonly known as:  COZAAR  Take 1 tablet (50 mg total) by mouth at bedtime.     nitroGLYCERIN 0.4 MG SL tablet  Commonly known as:  NITROSTAT  Place 1 tablet (0.4 mg total) under the tongue every 5 (five) minutes as needed for chest pain (up to 3 doses).     senna 8.6 MG Tabs tablet  Commonly  known as:  SENOKOT  Take 1 tablet by mouth every other day.        Disposition:   Future Appointments Provider Department Dept Phone   01/29/2014 12:00 PM [facility] Office [phone]   03/20/2014 9:45 AM Tonny Bollman, MD [facility] [place] Office [phone]   05/02/2014 10:45 AM Hillis Range, MD [facility] [phone]       Duration of Discharge Encounter: less than 30 minutes including physician time.  Signed,  Leonia Reeves.D.

## 2014-01-18 ENCOUNTER — Ambulatory Visit (HOSPITAL_COMMUNITY): Payer: BC Managed Care – PPO

## 2014-01-18 DIAGNOSIS — I519 Heart disease, unspecified: Secondary | ICD-10-CM

## 2014-01-18 NOTE — Discharge Instructions (Signed)
PLEASE TAKE ALL NEW MEDICATIONS/MEDICATION CHANGES AS PRESCRIBED.   PLEASE ATTEND ALL SCHEDULED/RECOMMENDED FOLLOW-UP APPOINTMENTS.   Supplemental Discharge Instructions for  Defibrillator Patients  Activity No heavy lifting or vigorous activity with your left/right arm for 6 to 8 weeks.  Do not raise your left/right arm above your head for one week.  Gradually raise your affected arm as drawn below.          01/21/14                      01/22/14                    01/23/14                  01/24/14  NO DRIVING for 1 week; you may begin driving on 1/97/58. WOUND CARE   Keep the wound area clean and dry.  Do not get this area wet for one week. No showers for one week; you may shower on                    01/24/14.   The tape/steri-strips on your wound will fall off; do not pull them off.  No bandage is needed on the site.  DO  NOT apply any creams, oils, or ointments to the wound area.   If you notice any drainage or discharge from the wound, any swelling or bruising at the site, or you develop a fever > 101? F after you are discharged home, call the office at once.  Special Instructions   You are still able to use cellular telephones; use the ear opposite the side where you have your pacemaker/defibrillator.  Avoid carrying your cellular phone near your device.   When traveling through airports, show security personnel your identification card to avoid being screened in the metal detectors.  Ask the security personnel to use the hand wand.   Avoid arc welding equipment, MRI testing (magnetic resonance imaging), TENS units (transcutaneous nerve stimulators).  Call the office for questions about other devices.   Avoid electrical appliances that are in poor condition or are not properly grounded.   Microwave ovens are safe to be near or to operate.  Additional information for defibrillator patients should your device go off:   If your device goes off ONCE and you feel fine afterward, notify the  device clinic nurses.   If your device goes off ONCE and you do not feel well afterward, call 911.   If your device goes off TWICE, call 911.   If your device goes off THREE times in one day, call 911.  DO NOT DRIVE YOURSELF OR A FAMILY MEMBER WITH A DEFIBRILLATOR TO THE HOSPITAL--CALL 911.   Cardiac Diet This diet can help prevent heart disease and stroke. Many factors influence your heart health, including eating and exercise habits. Coronary risk rises a lot with abnormal blood fat (lipid) levels. Cardiac meal planning includes limiting unhealthy fats, increasing healthy fats, and making other small dietary changes. General guidelines are as follows:  Adjust calorie intake to reach and maintain desirable body weight.  Limit total fat intake to less than 30% of total calories. Saturated fat should be less than 7% of calories.  Saturated fats are found in animal products and in some vegetable products. Saturated vegetable fats are found in coconut oil, cocoa butter, palm oil, and palm kernel oil. Read labels carefully to avoid these products as much as possible.  Use butter in moderation. Choose tub margarines and oils that have 2 grams of fat or less. Good cooking oils are canola and olive oils.  Practice low-fat cooking techniques. Do not fry food. Instead, broil, bake, boil, steam, grill, roast on a rack, stir-fry, or microwave it. Other fat reducing suggestions include:  Remove the skin from poultry.  Remove all visible fat from meats.  Skim the fat off stews, soups, and gravies before serving them.  Steam vegetables in water or broth instead of sauting them in fat.  Avoid foods with trans fat (or hydrogenated oils), such as commercially fried foods and commercially baked goods. Commercial shortening and deep-frying fats will contain trans fat.  Increase intake of fruits, vegetables, whole grains, and legumes to replace foods high in fat.  Increase consumption of nuts, legumes,  and seeds to at least 4 servings weekly. One serving of a legume equals  cup, and 1 serving of nuts or seeds equals  cup.  Choose whole grains more often. Have 3 servings per day (a serving is 1 ounce [oz]).  Eat 4 to 5 servings of vegetables per day. A serving of vegetables is 1 cup of raw leafy vegetables;  cup of raw or cooked cut-up vegetables;  cup of vegetable juice.  Eat 4 to 5 servings of fruit per day. A serving of fruit is 1 medium whole fruit;  cup of dried fruit;  cup of fresh, frozen, or canned fruit;  cup of 100% fruit juice.  Increase your intake of dietary fiber to 20 to 30 grams per day. Insoluble fiber may help lower your risk of heart disease and may help curb your appetite.  Soluble fiber binds cholesterol to be removed from the blood. Foods high in soluble fiber are dried beans, citrus fruits, oats, apples, bananas, broccoli, Brussels sprouts, and eggplant.  Try to include foods fortified with plant sterols or stanols, such as yogurt, breads, juices, or margarines. Choose several fortified foods to achieve a daily intake of 2 to 3 grams of plant sterols or stanols.  Foods with omega-3 fats can help reduce your risk of heart disease. Aim to have a 3.5 oz portion of fatty fish twice per week, such as salmon, mackerel, albacore tuna, sardines, lake trout, or herring. If you wish to take a fish oil supplement, choose one that contains 1 gram of both DHA and EPA.  Limit processed meats to 2 servings (3 oz portion) weekly.  Limit the sodium in your diet to 1500 milligrams (mg) per day. If you have high blood pressure, talk to a registered dietitian about a DASH (Dietary Approaches to Stop Hypertension) eating plan.  Limit sweets and beverages with added sugar, such as soda, to no more than 5 servings per week. One serving is:   1 tablespoon sugar.  1 tablespoon jelly or jam.   cup sorbet.  1 cup lemonade.   cup regular soda. CHOOSING FOODS Starches  Allowed:  Breads: All kinds (wheat, rye, raisin, white, oatmeal, [place], [place], and English muffin bread). Low-fat rolls: English muffins, frankfurter and hamburger buns, bagels, pita bread, tortillas (not fried). Pancakes, waffles, biscuits, and muffins made with recommended oil.  Avoid: Products made with saturated or trans fats, oils, or whole milk products. Butter rolls, cheese breads, croissants. Commercial doughnuts, muffins, sweet rolls, biscuits, waffles, pancakes, store-bought mixes. Crackers  Allowed: Low-fat crackers and snacks: Animal, graham, rye, saltine (with recommended oil, no lard), oyster, and matzo crackers. Bread sticks, melba toast, rusks, flatbread, pretzels, and  light popcorn.  Avoid: High-fat crackers: cheese crackers, butter crackers, and those made with coconut, palm oil, or trans fat (hydrogenated oils). Buttered popcorn. Cereals  Allowed: Hot or cold whole-grain cereals.  Avoid: Cereals containing coconut, hydrogenated vegetable fat, or animal fat. Potatoes / Pasta / Rice  Allowed: All kinds of potatoes, rice, and pasta (such as macaroni, spaghetti, and noodles).  Avoid: Pasta or rice prepared with cream sauce or high-fat cheese. Chow mein noodles, [place] fries. Vegetables  Allowed: All vegetables and vegetable juices.  Avoid: Fried vegetables. Vegetables in cream, butter, or high-fat cheese sauces. Limit coconut. Fruit in cream or custard. Protein  Allowed: Limit your intake of meat, seafood, and poultry to no more than 6 oz (cooked weight) per day. All lean, well-trimmed beef, veal, pork, and lamb. All chicken and [place] without skin. All fish and shellfish. Wild game: wild duck, rabbit, pheasant, and venison. Egg whites or low-cholesterol egg substitutes may be used as desired. Meatless dishes: recipes with dried beans, peas, lentils, and tofu (soybean curd). Seeds and nuts: all seeds and most nuts.  Avoid: Prime grade and other heavily marbled and fatty meats, such  as short ribs, spare ribs, rib eye roast or steak, frankfurters, sausage, bacon, and high-fat luncheon meats, mutton. Caviar. Commercially fried fish. Domestic duck, goose, venison sausage. Organ meats: liver, gizzard, heart, chitterlings, brains, kidney, sweetbreads. Dairy  Allowed: Low-fat cheeses: nonfat or low-fat cottage cheese (1% or 2% fat), cheeses made with part skim milk, such as mozzarella, farmers, string, or ricotta. (Cheeses should be labeled no more than 2 to 6 grams fat per oz.). Skim (or 1%) milk: liquid, powdered, or evaporated. Buttermilk made with low-fat milk. Drinks made with skim or low-fat milk or cocoa. Chocolate milk or cocoa made with skim or low-fat (1%) milk. Nonfat or low-fat yogurt.  Avoid: Whole milk cheeses, including colby, cheddar, mu[place], [address], [place], [place], [place], [address], Swiss, and blue. Creamed cottage cheese, cream cheese. Whole milk and whole milk products, including buttermilk or yogurt made from whole milk, drinks made from whole milk. Condensed milk, evaporated whole milk, and 2% milk. Soups and Combination Foods  Allowed: Low-fat low-sodium soups: broth, dehydrated soups, homemade broth, soups with the fat removed, homemade cream soups made with skim or low-fat milk. Low-fat spaghetti, lasagna, chili, and Spanish rice if low-fat ingredients and low-fat cooking techniques are used.  Avoid: Cream soups made with whole milk, cream, or high-fat cheese. All other soups. Desserts and Sweets  Allowed: Sherbet, fruit ices, gelatins, meringues, and angel food cake. Homemade desserts with recommended fats, oils, and milk products. Jam, jelly, honey, marmalade, sugars, and syrups. Pure sugar candy, such as gum drops, hard candy, jelly beans, marshmallows, mints, and small amounts of dark chocolate.  Avoid: Commercially prepared cakes, pies, cookies, frosting, pudding, or mixes for these products. Desserts containing whole milk products, chocolate,  coconut, lard, palm oil, or palm kernel oil. Ice cream or ice cream drinks. Candy that contains chocolate, coconut, butter, hydrogenated fat, or unknown ingredients. Buttered syrups. Fats and Oils  Allowed: Vegetable oils: safflower, sunflower, corn, soybean, cottonseed, sesame, canola, olive, or peanut. Non-hydrogenated margarines. Salad dressing or mayonnaise: homemade or commercial, made with a recommended oil. Low or nonfat salad dressing or mayonnaise.  Limit added fats and oils to 6 to 8 tsp per day (includes fats used in cooking, baking, salads, and spreads on bread). Remember to count the "hidden fats" in foods.  Avoid: Solid fats and shortenings: butter, lard, salt pork, bacon drippings.  Gravy containing meat fat, shortening, or suet. Cocoa butter, coconut. Coconut oil, palm oil, palm kernel oil, or hydrogenated oils: these ingredients are often used in bakery products, nondairy creamers, whipped toppings, candy, and commercially fried foods. Read labels carefully. Salad dressings made of unknown oils, sour cream, or cheese, such as blue cheese and Roquefort. Cream, all kinds: half-and-half, light, heavy, or whipping. Sour cream or cream cheese (even if "light" or low-fat). Nondairy cream substitutes: coffee creamers and sour cream substitutes made with palm, palm kernel, hydrogenated oils, or coconut oil. Beverages  Allowed: Coffee (regular or decaffeinated), tea. Diet carbonated beverages, mineral water. Alcohol: Check with your caregiver. Moderation is recommended.  Avoid: Whole milk, regular sodas, and juice drinks with added sugar. Condiments  Allowed: All seasonings and condiments. Cocoa powder. "Cream" sauces made with recommended ingredients.  Avoid: Carob powder made with hydrogenated fats. SAMPLE MENU Breakfast   cup orange juice   cup oatmeal  1 slice toast  1 tsp margarine  1 cup skim milk Lunch  [place] sandwich with 2 oz [place], 2 slices bread  Lettuce and  tomato slices  Fresh fruit  Carrot sticks  Coffee or tea Snack  Fresh fruit or low-fat crackers Dinner  3 oz lean ground beef  1 baked potato  1 tsp margarine   cup asparagus  Lettuce salad  1 tbs non-creamy dressing   cup peach slices  1 cup skim milk Document Released: 08/02/2008 Document Revised: 04/24/2012 Document Reviewed: 01/17/2012 Exit[employer] Patient Information 2014 [place], [place].

## 2014-01-21 ENCOUNTER — Telehealth: Payer: Self-pay | Admitting: Internal Medicine

## 2014-01-21 NOTE — Telephone Encounter (Signed)
[employer] paper Completed by [facility] faxed to [place] @ [phone] Mailed pt Original

## 2014-01-29 ENCOUNTER — Ambulatory Visit (INDEPENDENT_AMBULATORY_CARE_PROVIDER_SITE_OTHER): Payer: BC Managed Care – PPO | Admitting: *Deleted

## 2014-01-29 DIAGNOSIS — I4729 Other ventricular tachycardia: Secondary | ICD-10-CM

## 2014-01-29 DIAGNOSIS — I472 Ventricular tachycardia, unspecified: Secondary | ICD-10-CM

## 2014-01-29 DIAGNOSIS — I2589 Other forms of chronic ischemic heart disease: Secondary | ICD-10-CM

## 2014-01-29 DIAGNOSIS — I255 Ischemic cardiomyopathy: Secondary | ICD-10-CM

## 2014-01-29 LAB — MDC_IDC_ENUM_SESS_TYPE_INCLINIC
Battery Remaining Longevity: 84 mo
Date Time Interrogation Session: 20150325123141
HIGH POWER IMPEDANCE MEASURED VALUE: 66.375
HighPow Impedance: 66 Ohm
Implantable Pulse Generator Model: 2411
Implantable Pulse Generator Serial Number: 7142670
Lead Channel Impedance Value: 437.5 Ohm
Lead Channel Pacing Threshold Amplitude: 0.5 V
Lead Channel Pacing Threshold Pulse Width: 0.5 ms
Lead Channel Pacing Threshold Pulse Width: 0.5 ms
Lead Channel Pacing Threshold Pulse Width: 0.5 ms
Lead Channel Setting Pacing Amplitude: 3.5 V
Lead Channel Setting Pacing Pulse Width: 0.5 ms
Lead Channel Setting Sensing Sensitivity: 0.5 mV
MDC IDC MSMT LEADCHNL RA PACING THRESHOLD AMPLITUDE: 0.75 V
MDC IDC MSMT LEADCHNL RA SENSING INTR AMPL: 3.3 mV
MDC IDC MSMT LEADCHNL RV IMPEDANCE VALUE: 437.5 Ohm
MDC IDC MSMT LEADCHNL RV PACING THRESHOLD AMPLITUDE: 0.5 V
MDC IDC SET LEADCHNL RA PACING AMPLITUDE: 3.5 V
MDC IDC STAT BRADY RA PERCENT PACED: 37 %
MDC IDC STAT BRADY RV PERCENT PACED: 0.04 %
Zone Setting Detection Interval: 250 ms
Zone Setting Detection Interval: 300 ms
Zone Setting Detection Interval: 330 ms

## 2014-01-29 NOTE — Progress Notes (Signed)

## 2014-02-01 ENCOUNTER — Encounter (HOSPITAL_COMMUNITY): Payer: Self-pay | Admitting: Emergency Medicine

## 2014-02-01 ENCOUNTER — Telehealth: Payer: Self-pay | Admitting: Cardiology

## 2014-02-01 ENCOUNTER — Emergency Department (HOSPITAL_COMMUNITY)
Admission: EM | Admit: 2014-02-01 | Discharge: 2014-02-01 | Disposition: A | Discharge status: Home / Self Care | Payer: BC Managed Care – PPO | Attending: Emergency Medicine | Admitting: Emergency Medicine

## 2014-02-01 DIAGNOSIS — Z8781 Personal history of (healed) traumatic fracture: Secondary | ICD-10-CM | POA: Insufficient documentation

## 2014-02-01 DIAGNOSIS — Z7902 Long term (current) use of antithrombotics/antiplatelets: Secondary | ICD-10-CM | POA: Insufficient documentation

## 2014-02-01 DIAGNOSIS — Z950 Presence of cardiac pacemaker: Secondary | ICD-10-CM | POA: Insufficient documentation

## 2014-02-01 DIAGNOSIS — Z79899 Other long term (current) drug therapy: Secondary | ICD-10-CM | POA: Insufficient documentation

## 2014-02-01 DIAGNOSIS — E785 Hyperlipidemia, unspecified: Secondary | ICD-10-CM | POA: Insufficient documentation

## 2014-02-01 DIAGNOSIS — Z91199 Patient's noncompliance with other medical treatment and regimen due to unspecified reason: Secondary | ICD-10-CM | POA: Insufficient documentation

## 2014-02-01 DIAGNOSIS — Z95810 Presence of automatic (implantable) cardiac defibrillator: Secondary | ICD-10-CM | POA: Insufficient documentation

## 2014-02-01 DIAGNOSIS — I472 Ventricular tachycardia, unspecified: Secondary | ICD-10-CM | POA: Insufficient documentation

## 2014-02-01 DIAGNOSIS — I2510 Atherosclerotic heart disease of native coronary artery without angina pectoris: Secondary | ICD-10-CM | POA: Insufficient documentation

## 2014-02-01 DIAGNOSIS — Z9861 Coronary angioplasty status: Secondary | ICD-10-CM | POA: Insufficient documentation

## 2014-02-01 DIAGNOSIS — I10 Essential (primary) hypertension: Secondary | ICD-10-CM

## 2014-02-01 DIAGNOSIS — Z7982 Long term (current) use of aspirin: Secondary | ICD-10-CM | POA: Insufficient documentation

## 2014-02-01 DIAGNOSIS — I252 Old myocardial infarction: Secondary | ICD-10-CM | POA: Insufficient documentation

## 2014-02-01 DIAGNOSIS — F410 Panic disorder [episodic paroxysmal anxiety] without agoraphobia: Secondary | ICD-10-CM | POA: Insufficient documentation

## 2014-02-01 DIAGNOSIS — I4729 Other ventricular tachycardia: Secondary | ICD-10-CM | POA: Insufficient documentation

## 2014-02-01 DIAGNOSIS — M19019 Primary osteoarthritis, unspecified shoulder: Secondary | ICD-10-CM | POA: Insufficient documentation

## 2014-02-01 DIAGNOSIS — Z87891 Personal history of nicotine dependence: Secondary | ICD-10-CM | POA: Insufficient documentation

## 2014-02-01 DIAGNOSIS — Z9119 Patient's noncompliance with other medical treatment and regimen: Secondary | ICD-10-CM | POA: Insufficient documentation

## 2014-02-01 LAB — I-STAT TROPONIN, ED: Troponin i, poc: 0.01 ng/mL (ref 0.00–0.08)

## 2014-02-01 MED ORDER — ISOSORBIDE MONONITRATE ER 30 MG PO TB24: 30.0000 mg | ORAL_TABLET | Freq: Every day | ORAL | Status: AC

## 2014-02-01 NOTE — ED Notes (Signed)
The pt arrived by [facility] from an urgent care in  [place].  He was found to have high bp and was given aspirin and a sl nitro  At that urgent care and sent here for treatment.  The pt is alert and he denies pain anywhere.  He reports that he has been taking his bp  Med as directed.
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
## 2014-02-01 NOTE — Discharge Instructions (Signed)

## 2014-02-01 NOTE — Telephone Encounter (Signed)
Pt with cold lt hand -difficult to understand by phone how cold but is painful.  Instructed to go to ER or urgent care.

## 2014-02-01 NOTE — ED Notes (Signed)
The pt  Is  Having some intermittent thumping of his heart.  He had a aicd  Placed march 17th.  No pain

## 2014-02-19 ENCOUNTER — Ambulatory Visit (INDEPENDENT_AMBULATORY_CARE_PROVIDER_SITE_OTHER): Payer: BC Managed Care – PPO | Admitting: Physician Assistant

## 2014-02-19 ENCOUNTER — Encounter: Payer: Self-pay | Admitting: Physician Assistant

## 2014-02-19 ENCOUNTER — Encounter: Payer: Self-pay | Admitting: *Deleted

## 2014-02-19 ENCOUNTER — Encounter: Payer: Self-pay | Admitting: Internal Medicine

## 2014-02-19 ENCOUNTER — Ambulatory Visit (HOSPITAL_COMMUNITY)
Admission: RE | Admit: 2014-02-19 | Discharge: 2014-02-19 | Disposition: A | Discharge status: Home / Self Care | Payer: BC Managed Care – PPO | Source: Ambulatory Visit | Attending: Physician Assistant | Admitting: Physician Assistant

## 2014-02-19 ENCOUNTER — Ambulatory Visit (INDEPENDENT_AMBULATORY_CARE_PROVIDER_SITE_OTHER): Payer: BC Managed Care – PPO | Admitting: *Deleted

## 2014-02-19 ENCOUNTER — Encounter (HOSPITAL_COMMUNITY): Payer: Self-pay | Admitting: Pharmacy Technician

## 2014-02-19 VITALS — BP 170/90 | HR 64 | Ht 66.0 in | Wt 126.0 lb

## 2014-02-19 DIAGNOSIS — I2510 Atherosclerotic heart disease of native coronary artery without angina pectoris: Secondary | ICD-10-CM | POA: Diagnosis present

## 2014-02-19 DIAGNOSIS — Z01812 Encounter for preprocedural laboratory examination: Secondary | ICD-10-CM

## 2014-02-19 DIAGNOSIS — I472 Ventricular tachycardia, unspecified: Secondary | ICD-10-CM | POA: Diagnosis present

## 2014-02-19 DIAGNOSIS — Y849 Medical procedure, unspecified as the cause of abnormal reaction of the patient, or of later complication, without mention of misadventure at the time of the procedure: Secondary | ICD-10-CM | POA: Diagnosis present

## 2014-02-19 DIAGNOSIS — I309 Acute pericarditis, unspecified: Secondary | ICD-10-CM | POA: Diagnosis present

## 2014-02-19 DIAGNOSIS — E785 Hyperlipidemia, unspecified: Secondary | ICD-10-CM | POA: Diagnosis present

## 2014-02-19 DIAGNOSIS — I509 Heart failure, unspecified: Secondary | ICD-10-CM

## 2014-02-19 DIAGNOSIS — Z95810 Presence of automatic (implantable) cardiac defibrillator: Secondary | ICD-10-CM

## 2014-02-19 DIAGNOSIS — I2589 Other forms of chronic ischemic heart disease: Secondary | ICD-10-CM | POA: Diagnosis present

## 2014-02-19 DIAGNOSIS — I255 Ischemic cardiomyopathy: Secondary | ICD-10-CM

## 2014-02-19 DIAGNOSIS — E43 Unspecified severe protein-calorie malnutrition: Secondary | ICD-10-CM | POA: Diagnosis present

## 2014-02-19 DIAGNOSIS — I428 Other cardiomyopathies: Secondary | ICD-10-CM

## 2014-02-19 DIAGNOSIS — I5022 Chronic systolic (congestive) heart failure: Secondary | ICD-10-CM | POA: Diagnosis present

## 2014-02-19 DIAGNOSIS — I10 Essential (primary) hypertension: Secondary | ICD-10-CM

## 2014-02-19 DIAGNOSIS — I252 Old myocardial infarction: Secondary | ICD-10-CM

## 2014-02-19 DIAGNOSIS — I4729 Other ventricular tachycardia: Secondary | ICD-10-CM | POA: Diagnosis present

## 2014-02-19 DIAGNOSIS — Z87891 Personal history of nicotine dependence: Secondary | ICD-10-CM

## 2014-02-19 DIAGNOSIS — F411 Generalized anxiety disorder: Secondary | ICD-10-CM | POA: Diagnosis present

## 2014-02-19 DIAGNOSIS — T82198A Other mechanical complication of other cardiac electronic device, initial encounter: Principal | ICD-10-CM | POA: Diagnosis present

## 2014-02-19 DIAGNOSIS — E7800 Pure hypercholesterolemia, unspecified: Secondary | ICD-10-CM | POA: Diagnosis present

## 2014-02-19 LAB — CBC WITH DIFFERENTIAL/PLATELET
Basophils Absolute: 0 10*3/uL (ref 0.0–0.1)
Basophils Relative: 0.1 % (ref 0.0–3.0)
EOS PCT: 2.6 % (ref 0.0–5.0)
Eosinophils Absolute: 0.3 10*3/uL (ref 0.0–0.7)
HCT: 41.8 % (ref 39.0–52.0)
HEMOGLOBIN: 14.4 g/dL (ref 13.0–17.0)
Lymphocytes Relative: 14.3 % (ref 12.0–46.0)
Lymphs Abs: 1.7 10*3/uL (ref 0.7–4.0)
MCHC: 34.4 g/dL (ref 30.0–36.0)
MCV: 93.3 fl (ref 78.0–100.0)
Monocytes Absolute: 1 10*3/uL (ref 0.1–1.0)
Monocytes Relative: 7.8 % (ref 3.0–12.0)
NEUTROS PCT: 75.2 % (ref 43.0–77.0)
Neutro Abs: 9.2 10*3/uL — ABNORMAL HIGH (ref 1.4–7.7)
Platelets: 231 10*3/uL (ref 150.0–400.0)
RBC: 4.48 Mil/uL (ref 4.22–5.81)
RDW: 13.6 % (ref 11.5–14.6)
WBC: 12.2 10*3/uL — AB (ref 4.5–10.5)

## 2014-02-19 LAB — BASIC METABOLIC PANEL
BUN: 15 mg/dL (ref 6–23)
CALCIUM: 9.7 mg/dL (ref 8.4–10.5)
CHLORIDE: 103 meq/L (ref 96–112)
CO2: 28 mEq/L (ref 19–32)
Creatinine, Ser: 0.8 mg/dL (ref 0.4–1.5)
GFR: 105.5 mL/min (ref >60.00)
Glucose, Bld: 125 mg/dL — ABNORMAL HIGH (ref 70–99)
Potassium: 4.5 mEq/L (ref 3.5–5.1)
SODIUM: 138 meq/L (ref 135–145)

## 2014-02-19 LAB — MDC_IDC_ENUM_SESS_TYPE_INCLINIC
Brady Statistic RA Percent Paced: 28 %
Brady Statistic RV Percent Paced: 4.8 %
Date Time Interrogation Session: 20150415113708
Implantable Pulse Generator Serial Number: 7142670
Lead Channel Impedance Value: 412.5 Ohm
Lead Channel Sensing Intrinsic Amplitude: 4.1 mV
Lead Channel Sensing Intrinsic Amplitude: 4.8 mV
Lead Channel Setting Sensing Sensitivity: 0.5 mV
MDC IDC MSMT BATTERY REMAINING LONGEVITY: 90 mo
MDC IDC MSMT LEADCHNL RA IMPEDANCE VALUE: 475 Ohm
MDC IDC PG MODEL: 2411
MDC IDC SET LEADCHNL RA PACING AMPLITUDE: 3.5 V

## 2014-02-19 MED ORDER — CEFAZOLIN SODIUM-DEXTROSE 2-3 GM-% IV SOLR
2.0000 g | INTRAVENOUS | Status: DC
  Filled 2014-02-19: qty 50

## 2014-02-19 MED ORDER — CHLORHEXIDINE GLUCONATE 4 % EX LIQD
60.0000 mL | Freq: Once | CUTANEOUS | Status: DC
  Filled 2014-02-19: qty 60

## 2014-02-19 MED ORDER — SODIUM CHLORIDE 0.9 % IR SOLN
80.0000 mg | Status: DC
  Filled 2014-02-19: qty 2

## 2014-02-19 MED ORDER — SODIUM CHLORIDE 0.9 % IV SOLN
INTRAVENOUS | Status: DC
  Administered 2014-02-20: 16:00:00 via INTRAVENOUS

## 2014-02-19 NOTE — Assessment & Plan Note (Signed)
Ejection fraction 30%. Status post ICD 01/17/14. No evidence of heart failure on exam. Continue low sodium diet.

## 2014-02-19 NOTE — Assessment & Plan Note (Signed)
Patient had St. Jude dual-chamber ICD placed on 01/17/14 by Dr. Johney Frame for ischemic cardiomyopathy and ventricular tachycardia. Patient's defibrillator was checked today because he had low R waves from previous check. It appears his RV lead is not capturing probably do to it being dislodged. Dr. Johney Frame spoke to him in detail and he will be readmitted tomorrow for lead revision. We will check a PA and lateral chest x-ray today

## 2014-02-19 NOTE — Assessment & Plan Note (Signed)
No chest pain and no evidence of heart failure on exam.
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
## 2014-02-19 NOTE — Assessment & Plan Note (Signed)
Stable without recurrent chest pain. Continue current medications 

## 2014-02-19 NOTE — Patient Instructions (Addendum)
A P.A LATERAL chest x-ray TODAY takes a picture of the organs and structures inside the chest, including the heart, lungs, and blood vessels. This test can show several things, including, whether the heart is enlarges; whether fluid is building up in the lungs; and whether pacemaker / defibrillator leads are still in place.   Your physician recommends that you have pre procedure lab work today: BMET, CBC  You are having an ICD lead revision tomorrow. Please see instruction sheet given to you.   Your wound check is 4/29 at 10:30 am

## 2014-02-19 NOTE — Progress Notes (Signed)
ICD check in clinic. R-waves 4.79mV with an impedance of 410 and no capture 7.5@ 0.4.  CXR to be done and lead revision scheduled for 02/20/14.  Device reprogrammed AAI @ 40 and all therapies off per Dr. Johney Frame.

## 2014-02-19 NOTE — Assessment & Plan Note (Signed)
Patient's blood pressure is elevated today. He is very anxious about being here. His blood pressure is usually 135/80 home. Continue to monitor blood pressure home.

## 2014-02-20 ENCOUNTER — Encounter (HOSPITAL_COMMUNITY): Payer: Self-pay | Admitting: *Deleted

## 2014-02-20 ENCOUNTER — Inpatient Hospital Stay (HOSPITAL_COMMUNITY)
Admission: RE | Admit: 2014-02-20 | Discharge: 2014-02-22 | DRG: 260 | Disposition: A | Discharge status: Home / Self Care | Payer: BC Managed Care – PPO | Source: Ambulatory Visit | Attending: Internal Medicine | Admitting: Internal Medicine

## 2014-02-20 ENCOUNTER — Encounter (HOSPITAL_COMMUNITY)
Admission: RE | Disposition: A | Discharge status: Home / Self Care | Payer: BC Managed Care – PPO | Source: Ambulatory Visit | Attending: Internal Medicine

## 2014-02-20 DIAGNOSIS — I2510 Atherosclerotic heart disease of native coronary artery without angina pectoris: Secondary | ICD-10-CM

## 2014-02-20 DIAGNOSIS — I472 Ventricular tachycardia, unspecified: Secondary | ICD-10-CM

## 2014-02-20 DIAGNOSIS — I3139 Other pericardial effusion (noninflammatory): Secondary | ICD-10-CM

## 2014-02-20 DIAGNOSIS — T82198A Other mechanical complication of other cardiac electronic device, initial encounter: Secondary | ICD-10-CM

## 2014-02-20 DIAGNOSIS — I214 Non-ST elevation (NSTEMI) myocardial infarction: Secondary | ICD-10-CM

## 2014-02-20 DIAGNOSIS — I313 Pericardial effusion (noninflammatory): Secondary | ICD-10-CM

## 2014-02-20 DIAGNOSIS — E43 Unspecified severe protein-calorie malnutrition: Secondary | ICD-10-CM | POA: Diagnosis present

## 2014-02-20 DIAGNOSIS — Z95810 Presence of automatic (implantable) cardiac defibrillator: Secondary | ICD-10-CM

## 2014-02-20 DIAGNOSIS — I255 Ischemic cardiomyopathy: Secondary | ICD-10-CM

## 2014-02-20 DIAGNOSIS — T82190A Other mechanical complication of cardiac electrode, initial encounter: Secondary | ICD-10-CM | POA: Diagnosis present

## 2014-02-20 DIAGNOSIS — I10 Essential (primary) hypertension: Secondary | ICD-10-CM

## 2014-02-20 DIAGNOSIS — E7800 Pure hypercholesterolemia, unspecified: Secondary | ICD-10-CM | POA: Diagnosis present

## 2014-02-20 HISTORY — PX: LEAD REVISION: SHX5945

## 2014-02-20 LAB — SURGICAL PCR SCREEN
MRSA, PCR: NEGATIVE
STAPHYLOCOCCUS AUREUS: NEGATIVE

## 2014-02-20 SURGERY — LEAD REVISION
Anesthesia: LOCAL

## 2014-02-20 MED ORDER — ONDANSETRON HCL 4 MG/2ML IJ SOLN: 4.0000 mg | Freq: Four times a day (QID) | INTRAMUSCULAR | Status: DC | PRN

## 2014-02-20 MED ORDER — ACETAMINOPHEN 325 MG PO TABS
325.0000 mg | ORAL_TABLET | ORAL | Status: DC | PRN
  Administered 2014-02-20 – 2014-02-22 (×4): 650 mg via ORAL
  Filled 2014-02-20 (×4): qty 2

## 2014-02-20 MED ORDER — PHENYLEPHRINE HCL 10 MG/ML IJ SOLN
30.0000 ug/min | INTRAVENOUS | Status: DC | PRN
  Filled 2014-02-20: qty 1

## 2014-02-20 MED ORDER — FENTANYL CITRATE 0.05 MG/ML IJ SOLN
INTRAMUSCULAR | Status: AC
  Filled 2014-02-20: qty 2

## 2014-02-20 MED ORDER — HEPARIN (PORCINE) IN NACL 2-0.9 UNIT/ML-% IJ SOLN
INTRAMUSCULAR | Status: AC
  Filled 2014-02-20: qty 500

## 2014-02-20 MED ORDER — MUPIROCIN 2 % EX OINT
TOPICAL_OINTMENT | CUTANEOUS | Status: AC
  Administered 2014-02-20: 1
  Filled 2014-02-20: qty 22

## 2014-02-20 MED ORDER — SODIUM CHLORIDE 0.9 % IV SOLN
INTRAVENOUS | Status: DC
  Administered 2014-02-20: 50 mL/h via INTRAVENOUS

## 2014-02-20 MED ORDER — CEFAZOLIN SODIUM 1-5 GM-% IV SOLN
1.0000 g | Freq: Four times a day (QID) | INTRAVENOUS | Status: AC
Start: 2014-02-20 — End: 2014-02-21
  Administered 2014-02-20 – 2014-02-21 (×3): 1 g via INTRAVENOUS
  Filled 2014-02-20 (×3): qty 50

## 2014-02-20 MED ORDER — MUPIROCIN 2 % EX OINT
TOPICAL_OINTMENT | Freq: Once | CUTANEOUS | Status: DC
  Filled 2014-02-20: qty 22

## 2014-02-20 MED ORDER — SODIUM CHLORIDE 0.9 % IV BOLUS (SEPSIS): 250.0000 mL | INTRAVENOUS | Status: DC | PRN

## 2014-02-20 MED ORDER — LORAZEPAM 0.5 MG PO TABS
0.5000 mg | ORAL_TABLET | Freq: Four times a day (QID) | ORAL | Status: DC
  Administered 2014-02-20 – 2014-02-22 (×6): 0.5 mg via ORAL
  Filled 2014-02-20 (×5): qty 1

## 2014-02-20 MED ORDER — LIDOCAINE HCL (PF) 1 % IJ SOLN
INTRAMUSCULAR | Status: AC
  Filled 2014-02-20: qty 30

## 2014-02-20 MED ORDER — MIDAZOLAM HCL 5 MG/5ML IJ SOLN
INTRAMUSCULAR | Status: AC
  Filled 2014-02-20: qty 5

## 2014-02-20 MED ORDER — OXYCODONE HCL 5 MG PO TABS
5.0000 mg | ORAL_TABLET | Freq: Four times a day (QID) | ORAL | Status: DC | PRN
  Administered 2014-02-20 – 2014-02-21 (×3): 5 mg via ORAL
  Filled 2014-02-20 (×3): qty 1
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
## 2014-02-20 NOTE — Brief Op Note (Signed)
ICD system revision  RV lead perforation identified.  RV lead removed and new lead placed. Small pericardial effusion noted post procedure.  This is new when compared to prior to the procedure.  The effusion is presently too small for drainage and patient remains hemodynamically stable. Will admit to CCU for close observation.

## 2014-02-20 NOTE — Interval H&P Note (Signed)
History and Physical Interval Note:  02/20/2014 4:53 PM  The patient presents with right ventricular lead failure.  The lead by Xray has dislodged.  Bedside echo performed by me today reveals no pericardial effusion.  He will require lead revision at this time.  Risks, benefits, alternatives to ICD system revision were discussed in detail with the patient today. The patient  understands that the risks include but are not limited to bleeding, infection, pneumothorax, perforation, tamponade, vascular damage, renal failure, MI, stroke, death, inappropriate shocks, and lead dislodgement and wishes to proceed.  We will therefore proceed at this time.  Gary [name]  has presented today for surgery, with the diagnosis of RV lead failure. The various methods of treatment have been discussed with the patient and family. After consideration of risks, benefits and other options for treatment, the patient has consented to  Procedure(s): LEAD REVISION (N/A) as a surgical intervention .  The patient's history has been reviewed, patient examined, no change in status, stable for surgery.  I have reviewed the patient's chart and labs.  Questions were answered to the patient's satisfaction.     Hillis Range

## 2014-02-21 ENCOUNTER — Inpatient Hospital Stay (HOSPITAL_COMMUNITY): Payer: BC Managed Care – PPO

## 2014-02-21 DIAGNOSIS — I4729 Other ventricular tachycardia: Secondary | ICD-10-CM

## 2014-02-21 DIAGNOSIS — I472 Ventricular tachycardia: Secondary | ICD-10-CM

## 2014-02-21 DIAGNOSIS — Z95810 Presence of automatic (implantable) cardiac defibrillator: Secondary | ICD-10-CM

## 2014-02-21 DIAGNOSIS — I319 Disease of pericardium, unspecified: Secondary | ICD-10-CM

## 2014-02-21 MED ORDER — CARVEDILOL 25 MG PO TABS
25.0000 mg | ORAL_TABLET | Freq: Two times a day (BID) | ORAL | Status: DC
  Administered 2014-02-21 – 2014-02-22 (×2): 25 mg via ORAL
  Filled 2014-02-21 (×3): qty 1

## 2014-02-21 MED ORDER — LOSARTAN POTASSIUM 50 MG PO TABS
50.0000 mg | ORAL_TABLET | Freq: Every day | ORAL | Status: DC
  Administered 2014-02-22: 50 mg via ORAL
  Filled 2014-02-21: qty 1

## 2014-02-21 MED ORDER — ISOSORBIDE MONONITRATE ER 30 MG PO TB24
30.0000 mg | ORAL_TABLET | Freq: Every day | ORAL | Status: DC
  Administered 2014-02-22: 30 mg via ORAL
  Filled 2014-02-21: qty 1

## 2014-02-21 NOTE — Progress Notes (Signed)
Utilization review completed. Bristyl Mclees, RN, BSN. 

## 2014-02-21 NOTE — Progress Notes (Signed)
Pt transferred to 2W29 via wheelchair, MD ok to advance pt activity. Pt transferred on portable tele, room air. Pt tolerated transfer well. Pt family present for transfer. Receiving RN, Coralee North, present on arrival to new room. Pt placed on receiving units tele on arrival. Pt reminded can continue sling to left arm if desires, and reminded to not lay on left side or lift arm above head, ask for assistance prior to getting out of bed. Pt aware diet ordered and should receive lunch tray. Will continue to monitor. Koren Bound

## 2014-02-21 NOTE — Op Note (Signed)
NAMEZACARIA, GEGG                  ACCOUNT NO.:  [id number]  MEDICAL RECORD NO.:  [id number]  LOCATION:  2S04C                        FACILITY:  [facility]  PHYSICIAN:  Hillis Range, MD       DATE OF BIRTH:  [birth year]  DATE OF PROCEDURE: DATE OF DISCHARGE:                              OPERATIVE REPORT   SURGEON:  Hillis Range, MD  PREPROCEDURE DIAGNOSES: 1. Right ventricular ICD lead perforation 2. Ischemic cardiomyopathy. 3. Ventricular tachycardia.  POSTPROCEDURE DIAGNOSES: 1. Right ventricular ICD lead perforation from a prior implant procedure. 2. Acute pericardial effusion.  PROCEDURE:  ICD lead removal with a new right ventricular ICD lead placed.  INTRODUCTION:  Mr. Barriga is a very pleasant 63 year old gentleman who underwent implantation of a St. Jude Medical dual-chamber ICD on January 17, 2014, by me for ischemic cardiomyopathy, and ventricular tachycardia.  He did well initially following device implant. Unfortunately, he has subsequently been found to have non captured from his right ventricular defibrillator lead.  R-waves were also reduced and impedance changes were suggestive of lead dislodgement.  The patient presents today for ICD system revision.  DESCRIPTION OF PROCEDURE:  Informed written consent was obtained and the patient was brought to the electrophysiology lab in the fasting state. He received IV Versed as sedation for the procedure today.  A limited bedside echo was performed and revealed no pericardial effusion prior to the procedure.  The patient's ICD was interrogated and loss of RV capture was confirmed.  R-waves were 4 and impedance was 400.  I therefore elected to perform lead revision.  Fluoroscopy of the lead revealed that the lead had clearly perforated the right ventricle and the distal tip was in the pericardial space.  The patient was hemodynamically stable at this time. The patient was therefore prepped and draped in the usual sterile fashion  by the EP lab staff.  The skin overlying his existing defibrillator was infiltrated with lidocaine for local analgesia.  A 4- cm incision was made over the ICD pocket.  Using a combination of sharp and blunt dissection, the pocket was opened and the device was exposed. The device was removed from the pocket.  There was no foreign matter or debris within the pocket.  The right ventricular lead was freed within the pocket and the active fixation device was withdrawn in a standard fashion. The silk sutures over the suture sleeve were removed. The lead was then gently removed from the pericardial space and put back into the right ventricle.  The patient remained hemodynamically stable.  I elected to replace the lead with a new lead today due to concerns with the active fixation mechanism may no longer be functional.  The lead was therefore removed.  Using a Seldinger technique, left axillary vein was cannulated.  Through the left axillary vein, a St. Jude Medical Emhouse, model [id number]-58 (serial #[id number]) lead was advanced with fluoroscopic visualization into the high right ventricular septum.  The lead was actively fixed in this location.  R- waves were measured 8.5 mV with impedance of 740 ohms and a threshold of 0.75 volts at 0.5 milliseconds.  The lead was secured to the pectoralis  fascia using #2 silk suture over the suture sleeve.  The atrial lead remained fluoroscopically stable.  Atrial lead P-waves measured 4.6 mV with impedance of 480 ohms and a threshold of 0.75 volts at 0.5 milliseconds.  Both leads were therefore connected to the existing device which was a [employer]. [employer] Ellipse DDDR, model [id number] ICD (serial [id number]).  The pocket was irrigated with copious gentamicin solution.  The device was then placed into the pocket.  The pocket was then closed in two layers with 2-0 Vicryl suture for the subcutaneous and subcuticular layers.  Steri-Strips and a sterile dressing were  then applied.  Visualization of the left heart border in the LAO view revealed a new pericardial effusion.  I therefore performed a bedside echocardiogram.  Dr. Shirlee LatchMcLean and I reviewed this together.  The patient clearly had a new but small pericardial effusion.  The IVC appeared normal.  There was only mild indentation of the right ventricle.  The pericardial effusion was really localized to the right ventricular outflow tract.  It was felt to be too small for pericardiocentesis at this time.  The patient remained hemodynamically stable.  The patient is therefore transferred to the CCU for close observation and management.  CONCLUSIONS: 1. Right ventricular ICD lead perforation into the pericardial space     successfully removed. 2. ICD system revision with a new St. Jude Medical Durata right     ventricular lead placed. 3. DFTs not performed today due to a new pericardial effusion observed     post procedure. 4. The patient remained hemodynamically stable at the time of this     dictation and will be followed closely overnight.     Hillis RangeJames Kassidy Dockendorf, MD     JA/MEDQ  D:  02/20/2014  T:  02/21/2014  Job:  [id number]

## 2014-02-21 NOTE — Progress Notes (Signed)
Dr Johney Frame called, updated pt has new bed on 2W. Orders clarified with MD. MD ok to start pt on cardiac diet. MD ok to advance activity as tolerates/BRP.  MD ok for pt not to wear sling to left arm post procedure, however pt prefers to wear currently as a reminder not to lift arm over head. Pt stood at bedside to void with minimal assistance. Will continue to monitor. Koren Bound

## 2014-02-21 NOTE — Progress Notes (Signed)
Orthopedic Tech Progress Note Patient Details:  Gary [name] [name] [birth year] [id number] Spoke with nurse; patient has arm sling. No action needed from [employer]. Patient ID: Gary [name], male   DOB: Jul 11, 1951, 63 y.o.   MRN: [id number]   Gary [name] 02/21/2014, 11:07 AM

## 2014-02-21 NOTE — Progress Notes (Signed)
   SUBJECTIVE: The patient is doing well today.  At this time, he denies chest pain, shortness of breath, or any new concerns.  S/p RV lead revision 02/20/2014 with acute pericardial effusion afterwards.  Has not had tamponade physiology and appears to be making good progress.  CXR reveals stable lead, no ptx  CURRENT MEDICATIONS: .  ceFAZolin (ANCEF) IV  1 g Intravenous Q6H  . LORazepam  0.5 mg Oral QID   . sodium chloride 50 mL/hr at 02/21/14 0200    OBJECTIVE: Physical Exam: Filed Vitals:   02/21/14 0130 02/21/14 0145 02/21/14 0200 02/21/14 0358  BP:   100/68   Pulse: 72 73 72   Temp:    98 F (36.7 C)  TempSrc:    Axillary  Resp: 20 14 15    Height:      Weight:      SpO2: 93% 93% 93%     Intake/Output Summary (Last 24 hours) at 02/21/14 0612 Last data filed at 02/20/14 2100  Gross per 24 hour  Intake     50 ml  Output    300 ml  Net   -250 ml    Telemetry reveals sinus rhythm  GEN- The patient is well appearing, alert and oriented x 3 today.   Head- normocephalic, atraumatic Eyes-  Sclera clear, conjunctiva pink Ears- hearing intact Oropharynx- clear Neck- supple,  Lungs- Clear to ausculation bilaterally, normal work of breathing Heart- Regular rate and rhythm, no murmurs, rubs or gallops, PMI not laterally displaced GI- soft, NT, ND, + BS Extremities- no clubbing, cyanosis, or edema Skin- device pocket is without hematoma Psych- euthymic mood, full affect Neuro- strength and sensation are intact  LABS: Basic Metabolic Panel:  Recent Labs  20/10/07 1157  NA 138  K 4.5  CL 103  CO2 28  GLUCOSE 125*  BUN 15  CREATININE 0.8  CALCIUM 9.7   CBC:  Recent Labs  02/19/14 1157  WBC 12.2*  NEUTROABS 9.2*  HGB 14.4  HCT 41.8  MCV 93.3  PLT 231.0    RADIOLOGY: Dg Chest 2 View 02/19/2014   CLINICAL DATA:  History of hypertension  EXAM: CHEST  2 VIEW  COMPARISON:  DG CHEST 2 VIEW dated 01/18/2014  FINDINGS: The heart size and mediastinal contours  are within normal limits. Both lungs are clear. The visualized skeletal structures are unremarkable. Left chest wall dual chamber AICD.  IMPRESSION: No active cardiopulmonary disease.   Electronically Signed   By: Salome Holmes M.D.   On: 02/19/2014 12:23    ASSESSMENT AND PLAN:  Active Problems:   Cardiomyopathy, ischemic   Pericardial effusion  1. Acute pericardial effusion I did a limited bedside echo this am which revealed a very small effusion without tamponade physiology I will request a formal echo Transfer to telemetry and stop IV fluids Hold ASA and plavix for 2 weeks  2. RV lead perforation As above New lead function is normal  Device interrogation this am is reviewed  3. Ischemic CM/CAD Stable Medicines are on hold due to effusion  Transfer to telemetry Home either tomorrow or Sunday Routine wound care and follow-up  Hillis Range MD

## 2014-02-21 NOTE — Progress Notes (Signed)
Echocardiogram 2D Echocardiogram has been performed.  Genene Churn Amber Guthridge 02/21/2014, 8:00 AM

## 2014-02-22 DIAGNOSIS — I319 Disease of pericardium, unspecified: Secondary | ICD-10-CM

## 2014-02-22 DIAGNOSIS — T82190A Other mechanical complication of cardiac electrode, initial encounter: Secondary | ICD-10-CM | POA: Diagnosis present

## 2014-02-22 MED ORDER — CLOPIDOGREL BISULFATE 75 MG PO TABS: 75.0000 mg | ORAL_TABLET | Freq: Every day | ORAL | Status: DC

## 2014-02-22 MED ORDER — CARVEDILOL 25 MG PO TABS: 25.0000 mg | ORAL_TABLET | Freq: Two times a day (BID) | ORAL | Status: DC

## 2014-02-22 NOTE — Discharge Instructions (Signed)
Pacemaker, Fractured Lead Your pacemaker has a broken lead. A pacemaker is a device used for treating hearts with dangerously slow beats. Without treatment, a slow heart rate can lead to weakness, confusion, dizziness, and fainting. It can also cause shortness of breath and death.  A slow heart rate can be caused by problems with body metabolism. It can also be due to blocked arteries in the heart's conduction system. This is like the electrical wiring in your house. These conditions can often be treated and a normal heart rate will come back. Slow heart rates can also be a side effect of certain medications. Stopping the medicine or decreasing the dose may correct the problem.  The conduction system of the heart may become permanently damaged for many reasons. Some medications can cause a slow heart rate as a side effect. There are no long-term medications that can be taken to speed up the heart rate. A pacemaker is the only solution. Please read the instructions below and refer to this sheet in the next few weeks. Your caregiver may also give you specific instructions. While your treatment has been planned according to the most current medical practices available, complications can occur. If you have any problems or questions after discharge, call your caregiver. HOME CARE INSTRUCTIONS   Do not shower for 48 hours after the procedure to keep the incision dry.  Avoid using the arm on the side the pacer was placed for the first week.  After a week, you may resume prior activities.  Household appliances do not interfere with modern day pacemakers.  Digital cellular phones should be kept 12 inches away from the pacemaker when on. Use the ear on the opposite side of the pacemaker.  Never leave a cell phone in a pocket overlying the pacemaker. Patients with pacemakers should avoid powerful electromagnetic fields that may reprogram the pacemaker. MRI (magnetic resonance imaging) scans cannot be  performed on patients with pacemakers. SEEK IMMEDIATE MEDICAL CARE IF:   Any of the problems you had before the placement of the pacemaker return.  You develop chest pain, feel faint, lightheaded, or pass out. Document Released: 11/23/2005 Document Revised: 07/18/2012 Document Reviewed: 05/08/2013 [facility] Patient Information 2014 [place], [place]. Pacemaker Implantation Care After Refer to this sheet over the next few weeks. These instructions provide you with information on caring for the pacemaker after implantation. Your caregiver may also give you more specific instructions. Your treatment has been planned according to current medical practices, but problems sometimes occur. Call your caregiver if you have any problems or questions regarding your pacemaker.  HOME CARE INSTRUCTIONS  Always carry your pacemaker identification card with you. The card should list the implant date, device model, and manufacturer.  Keep the incision site dry for 2 3 days after the procedure or as told by your caregiver. It takes several weeks for the incision to completely heal.  Do not raise your upper arms above your shoulders for a week after the procedure or as told by your caregiver.  Avoid sudden jerking, pulling, or chopping movements that pull your arms away from your body for 6 weeks or as told by your caregiver. For example, do not play golf for 6 weeks or as told by your caregiver.  Take medicine as told by your caregiver.  Wear a medical alert bracelet.  Learn how to check your pulse. Follow directions about when to call for help or be concerned.  Exercise as told by your caregiver.  You may travel by  airplane. Tell security you have a pacemaker before going through a metal detector.  Avoid strong electromagnetic fields. You may not be able to have an MRI scan because of the strong magnets used during that test.  Do not place pressure over the area where the pacermaker is.  When using  your cell phone, hold it to the ear opposite of the pacemaker. Do not leave your cell phone in a pocket over the pacemaker.  Have your pacemaker checked every 3 6 months or as directed by your caregiver. There is a battery within the generater that lasts about 5 years. Most pacemakers do not give a warning signal when the battery is running low on power. Pacemaker Information  Changing the battery means removing the old generator through the original incision site and plugging the existing wires into a new generator.  Electrocardiography and heart rhythm monitoring may be done to see if your pacemaker is working properly.  Some pacemakers come with a home monitoring system that provides your caregiver with an ongoing status of your pacemaker and alerts your caregiver if there is a problem.  Home appliances do not interfere with your pacemaker. SEEK IMMEDIATE MEDICAL CARE IF:  You have swelling of the arm that is on the same side as the pacemaker.  You have any type of drainage coming from the pacemaker incision site.  You have skin redness or warmth over the pacemaker insertion site.  You have dizzy spells, feel weak, faint, or pass out.  You have chest pain or shortness of breath.  You were physically injured and think your pacemaker may have been damaged.  You feel your heart skipping beats or beating irregularly.  You are suddenly very tired or have pain in your upper back. Document Released: 05/13/2005 Document Revised: 07/18/2012 Document Reviewed: 02/24/2012 [facility] Patient Information 2014 [place].

## 2014-02-22 NOTE — Discharge Summary (Signed)
Patient ID: Don BroachJimmy W Distler,  MRN: [id number], DOB/AGE: Apr 19, 1951 63 y.o.  Admit date: 02/20/2014 Discharge date: 02/22/2014  Primary Care Provider[facility] Dr [facility]er. [facility] Primary Cardiologist: Dr Excell Seltzerooper/ Dr Allred  Discharge Diagnoses Principal Problem:   ICD lead fracture- revision 02/21/14 Active Problems:   Pericardial effusion- small post lead revision   Cardiomyopathy, ischemic- EF 30%   CAD- Dx DES Nov 2014 (STEMI)   Essential hypertension- Meds adjusted 4/15   Protein-calorie malnutrition, severe   Hypercholesterolemia   S/P ICD March 2015    Procedures: ICD Lead revision 02/20/14   Hospital Course:  This is a 63 year old male patient who underwent St. Jude dual-chamber ICD on 01/17/14 by Dr. Johney FrameAllred for ischemia cardiomyopathy. He had ventricular tachycardia in the setting of an STEMI treated with balloon angioplasty of diagonal 1 in 09/2013 and had persistent LV dysfunction despite guideline directed therapy. The patient was seen in the office 02/19/14. The patient's defibrillator was checked because he had low R waves from previous check. It showed that his RV lead was not capturing probably do to it being dislodged. Dr. Johney FrameAllred spoke to him in detail and he was admitted 02/20/14 for lead revision. Please see Dr Jenel LucksAllred's OP note for complete details. The procedure was complicated by a small pericardial effusion. Echo did not suggest tamponade. He was watched for 24 hrs. Dr Ladona Ridgelaylor did a bedside echo 02/22/14 and feels he is stable for discharge. He is not to take Plavix for two weeks. We increased his Coreg for B/P. He'll follow up in the device clinic 1-2 weeks.     Discharge Vitals:  Blood pressure 135/70, pulse 88, temperature 99.5 F (37.5 C), temperature source Oral, resp. rate 17, height 5\' 6"  (1.676 m), weight 119 lb 14.9 oz (54.4 kg), SpO2 97.00%.    Labs: Results for orders placed during the hospital encounter of 02/20/14 (from the past 48 hour(s))  SURGICAL PCR SCREEN      Status: None   Collection Time    02/20/14  2:34 PM      Result Value Ref Range   MRSA, PCR NEGATIVE  NEGATIVE   Staphylococcus aureus NEGATIVE  NEGATIVE   Comment:            The Xpert SA Assay (FDA     approved for NASAL specimens     in patients over 63 years of age),     is one component of     a comprehensive surveillance     program.  Test performance has     been validated by [facility] for patients greater     than or equal to 563 year old.     It is not intended     to diagnose infection nor to     guide or monitor treatment.    Disposition:      Follow-up Information   Follow up with Hillis RangeJames Allred, MD. (office will call you)    Specialty:  Cardiology   Contact information:   [address] [place] [address] [phone]       Discharge Medications:    Medication List         acetaminophen 500 MG tablet  Commonly known as:  TYLENOL  Take 500 mg by mouth every 6 (six) hours as needed for moderate pain.     aspirin 81 MG EC tablet  Take 1 tablet (81 mg total) by mouth daily.  carvedilol 25 MG tablet  Commonly known as:  COREG  Take 1 tablet (25 mg total) by mouth 2 (two) times daily with a meal.     clopidogrel 75 MG tablet  Commonly known as:  PLAVIX  Take 1 tablet (75 mg total) by mouth daily.  Start taking on:  03/15/2014     ezetimibe 10 MG tablet  Commonly known as:  ZETIA  Take 1 tablet (10 mg total) by mouth daily.     feeding supplement Liqd  Take 1 Container by mouth daily.     isosorbide mononitrate 30 MG 24 hr tablet  Commonly known as:  IMDUR  Take 1 tablet (30 mg total) by mouth daily.     LORazepam 0.5 MG tablet  Commonly known as:  ATIVAN  Take 0.5 mg by mouth 4 (four) times daily.     losartan 50 MG tablet  Commonly known as:  COZAAR  Take 1 tablet (50 mg total) by mouth at bedtime.     nitroGLYCERIN 0.4 MG SL tablet  Commonly known as:  NITROSTAT  Place 1 tablet (0.4 mg total) under the tongue  every 5 (five) minutes as needed for chest pain (up to 3 doses).     senna 8.6 MG Tabs tablet  Commonly known as:  SENOKOT  Take 1 tablet by mouth every other day.         Duration of Discharge Encounter: Greater than 30 minutes including physician time.  Jolene Provost PA-C 02/22/2014 9:32 AM

## 2014-02-22 NOTE — Progress Notes (Signed)
Patient ID: REISS GRANADE, male   DOB: [birth year], 63 y.o.   MRN: [id number]   Patient Name: Gary [name] Date of Encounter: 02/22/2014     Active Problems:   Cardiomyopathy, ischemic   Pericardial effusion    SUBJECTIVE  Minimal chest pain, no sob.  CURRENT MEDS . carvedilol  25 mg Oral BID WC  . isosorbide mononitrate  30 mg Oral Daily  . LORazepam  0.5 mg Oral QID  . losartan  50 mg Oral Daily    OBJECTIVE  Filed Vitals:   02/21/14 1631 02/21/14 1800 02/21/14 2144 02/22/14 0327  BP: 132/85  110/66 135/70  Pulse: 102 85 85 88  Temp: 98.7 F (37.1 C)  99.8 F (37.7 C) 99.5 F (37.5 C)  TempSrc: Oral  Oral Oral  Resp: 18  18 17   Height:      Weight:      SpO2: 93%  93% 97%    Intake/Output Summary (Last 24 hours) at 02/22/14 0909 Last data filed at 02/22/14 0736  Gross per 24 hour  Intake    840 ml  Output   1400 ml  Net   -560 ml   Filed Weights   02/20/14 1539 02/21/14 0600  Weight: 120 lb 4 oz (54.545 kg) 119 lb 14.9 oz (54.4 kg)    PHYSICAL EXAM  General: Pleasant, NAD. Neuro: Alert and oriented X 3. Moves all extremities spontaneously. HEENT:  Normal  Neck: Supple without bruits or JVD. Lungs:  Resp regular and unlabored, CTA. Heart: RRR no s3, s4, or murmurs. Abdomen: Soft, non-tender, non-distended, BS + x 4.  Extremities: No clubbing, cyanosis or edema. DP/PT/Radials 2+ and equal bilaterally.  Accessory Clinical Findings  CBC  Recent Labs  02/19/14 1157  WBC 12.2*  NEUTROABS 9.2*  HGB 14.4  HCT 41.8  MCV 93.3  PLT 231.0   Basic Metabolic Panel  Recent Labs  02/19/14 1157  NA 138  K 4.5  CL 103  CO2 28  GLUCOSE 125*  BUN 15  CREATININE 0.8  CALCIUM 9.7   Liver Function Tests No results found for this basename: AST, ALT, ALKPHOS, BILITOT, PROT, ALBUMIN,  in the last 72 hours No results found for this basename: LIPASE, AMYLASE,  in the last 72 hours Cardiac Enzymes No results found for this basename: CKTOTAL, CKMB,  CKMBINDEX, TROPONINI,  in the last 72 hours BNP No components found with this basename: POCBNP,  D-Dimer No results found for this basename: DDIMER,  in the last 72 hours Hemoglobin A1C No results found for this basename: HGBA1C,  in the last 72 hours Fasting Lipid Panel No results found for this basename: CHOL, HDL, LDLCALC, TRIG, CHOLHDL, LDLDIRECT,  in the last 72 hours Thyroid Function Tests No results found for this basename: TSH, T4TOTAL, FREET3, T3FREE, THYROIDAB,  in the last 72 hours  TELE nsr   Radiology/Studies  Dg Chest 2 View  02/19/2014   CLINICAL DATA:  History of hypertension  EXAM: CHEST  2 VIEW  COMPARISON:  DG CHEST 2 VIEW dated 01/18/2014  FINDINGS: The heart size and mediastinal contours are within normal limits. Both lungs are clear. The visualized skeletal structures are unremarkable. Left chest wall dual chamber AICD.  IMPRESSION: No active cardiopulmonary disease.   Electronically Signed   By: Salome Holmes M.D.   On: 02/19/2014 12:23   Dg Chest Portable 1 View  02/21/2014   CLINICAL DATA:  Pacemaker placement  EXAM: PORTABLE CHEST - 1 VIEW  COMPARISON:  DG CHEST 2 VIEW dated 02/19/2014  FINDINGS: Left AICD in place. Cervical fusion hardware partly visualized. No pneumothorax or focal pulmonary opacity. No pleural effusion. No acute osseous finding.  IMPRESSION: No pneumothorax after left AICD placement.   Electronically Signed   By: Christiana PellantGretchen  Green M.D.   On: 02/21/2014 07:49    Quick look repeat echo by me shows minimal effusion  ASSESSMENT AND PLAN 1. S/p ICD several weeks ago complicated by late perforation, s/p ICD lead revision, with small to moderate pericardial effusion 2. Chronic systolic heart failure, well compensated Rec: his pericardial effusion is stable. I have done a bedside echo this morning and his effusion if anything is smaller. Ok for discharge home. I would like him to take his home meds except for no plavix for 2 weeks. He will need  followup in our device clinic in 1-2 weeks.   Gregg Taylor,M.D.  Gregg Taylor,M.D.  02/22/2014 9:09 AM

## 2014-02-22 NOTE — Progress Notes (Signed)
Pt discharged per MD order and protocol. Discharge instructions reviewed with patient and wife, all questions answered. Pt aware of follow up appointments. Pt also aware medications have been called into his pharmacy.

## 2014-02-25 ENCOUNTER — Telehealth: Payer: Self-pay | Admitting: Physician Assistant

## 2014-02-25 NOTE — Telephone Encounter (Signed)
    Patient with a hx of CAD with NSTEMI in 09/2013 s/p PCTA to D1 on DAPT. He was recently discharged on 02/22/14 after ICD lead replacement with Dr. Johney Frame on 02/20/14 complicated by an acute pericardial effusion, without tamponade physiology. He also had some bleeding and was taken off DAPT for 2 weeks. He called today complaining of CP in the setting of elevated BP 180/97. This was only momentary and not associated with SOB or diaphoresis. His BP has now returned to normal. He said he probably "over-did it today" with activity. He was advised to come into the ED if he has any more chest pain tonight. Also, to call the office in the morning to see if he should be seen. They verbalized understanding.    Thereasa Parkin PA-C  [facility]

## 2014-02-26 ENCOUNTER — Telehealth: Payer: Self-pay | Admitting: Cardiovascular Disease

## 2014-02-26 ENCOUNTER — Encounter: Payer: Self-pay | Admitting: Internal Medicine

## 2014-02-26 NOTE — Telephone Encounter (Signed)
Dr Excell Seltzer reviewed the on-call note and felt like the pt did not need an office visit at this time if he is feeling okay.  I spoke with the pt's wife and she said the pt is doing fine now.  At this time we will have the pt contact the office with any other questions or concerns.

## 2014-02-26 NOTE — Telephone Encounter (Signed)
New problem   Pt's wife called and stated pt had chest pain and hypertension last night and want to know if he need to come into office. They talked to doc on call last night also. Please call pt.

## 2014-03-05 ENCOUNTER — Ambulatory Visit (INDEPENDENT_AMBULATORY_CARE_PROVIDER_SITE_OTHER): Payer: BC Managed Care – PPO | Admitting: *Deleted

## 2014-03-05 DIAGNOSIS — I4729 Other ventricular tachycardia: Secondary | ICD-10-CM

## 2014-03-05 DIAGNOSIS — I2589 Other forms of chronic ischemic heart disease: Secondary | ICD-10-CM

## 2014-03-05 DIAGNOSIS — I255 Ischemic cardiomyopathy: Secondary | ICD-10-CM

## 2014-03-05 DIAGNOSIS — I472 Ventricular tachycardia, unspecified: Secondary | ICD-10-CM

## 2014-03-05 LAB — MDC_IDC_ENUM_SESS_TYPE_INCLINIC
Battery Remaining Longevity: 98.4 mo
Brady Statistic RV Percent Paced: 0 %
HighPow Impedance: 65.25 Ohm
Implantable Pulse Generator Serial Number: 7142670
Lead Channel Impedance Value: 450 Ohm
Lead Channel Pacing Threshold Amplitude: 0.5 V
Lead Channel Pacing Threshold Amplitude: 1 V
Lead Channel Pacing Threshold Amplitude: 1 V
Lead Channel Pacing Threshold Pulse Width: 0.5 ms
Lead Channel Pacing Threshold Pulse Width: 0.5 ms
Lead Channel Sensing Intrinsic Amplitude: 4.2 mV
Lead Channel Setting Pacing Amplitude: 3.5 V
Lead Channel Setting Pacing Amplitude: 3.5 V
Lead Channel Setting Pacing Pulse Width: 0.5 ms
Lead Channel Setting Sensing Sensitivity: 0.5 mV
MDC IDC MSMT LEADCHNL RA PACING THRESHOLD PULSEWIDTH: 0.5 ms
MDC IDC MSMT LEADCHNL RV IMPEDANCE VALUE: 575 Ohm
MDC IDC MSMT LEADCHNL RV PACING THRESHOLD AMPLITUDE: 0.5 V
MDC IDC MSMT LEADCHNL RV PACING THRESHOLD PULSEWIDTH: 0.5 ms
MDC IDC MSMT LEADCHNL RV SENSING INTR AMPL: 9.4 mV
MDC IDC PG MODEL: 2411
MDC IDC SESS DTM: 20150429134701
MDC IDC SET ZONE DETECTION INTERVAL: 250 ms
MDC IDC STAT BRADY RA PERCENT PACED: 0.1 %
Zone Setting Detection Interval: 300 ms
Zone Setting Detection Interval: 330 ms

## 2014-03-06 NOTE — Progress Notes (Signed)
Wound check appointment s/p RV lead revision. Steri-strips removed. Wound without redness or edema. Incision edges approximated, wound well healed. Normal device function. Thresholds, sensing, and impedances consistent with implant measurements. Device programmed at 3.5V for extra safety margin until 3 month visit. Histogram distribution appropriate for patient and level of activity. No mode switches or ventricular arrhythmias noted. Patient educated about wound care, arm mobility, lifting restrictions, shock plan. Plavix restarted this OV following repeat ECHO (no pericardial effusion present). ROV on 6-26 with JA.

## 2014-03-14 ENCOUNTER — Encounter: Payer: Self-pay | Admitting: Cardiovascular Disease

## 2014-03-14 ENCOUNTER — Ambulatory Visit (INDEPENDENT_AMBULATORY_CARE_PROVIDER_SITE_OTHER): Payer: BC Managed Care – PPO | Admitting: Cardiovascular Disease

## 2014-03-14 VITALS — BP 150/82 | HR 80 | Ht 66.0 in | Wt 125.8 lb

## 2014-03-14 DIAGNOSIS — I10 Essential (primary) hypertension: Secondary | ICD-10-CM

## 2014-03-14 DIAGNOSIS — I2510 Atherosclerotic heart disease of native coronary artery without angina pectoris: Secondary | ICD-10-CM

## 2014-03-14 DIAGNOSIS — I2589 Other forms of chronic ischemic heart disease: Secondary | ICD-10-CM

## 2014-03-14 NOTE — Patient Instructions (Signed)
Your physician recommends that you continue on your current medications as directed. Please refer to the Current Medication list given to you today.  Your physician wants you to follow-up in: 6 MONTHS with Dr Excell Seltzer.  You will receive a reminder letter in the mail two months in advance. If you don't receive a letter, please call our office to schedule the follow-up appointment.  Your physician recommends that you return for a FASTING LIPID, LIVER and BMP in 6 MONTHS (one week prior to appointment with Dr Rigoberto Noel to eat or drink after midnight, lab opens at 7:30 am

## 2014-03-14 NOTE — Progress Notes (Signed)
HPI:  63 year old gentleman presenting for followup evaluation. The patient has a history of coronary artery disease, myocardial infarction, and severe ischemic cardiomyopathy. He underwent ICD implantation in March and then underwent lead removal with a new RV ICD lead in April because of lead perforation. An echocardiogram on April 17 demonstrated a small pericardial effusion with no evidence of hemodynamic sequelae. He returns today for office followup evaluation after his lead revision.  The patient is doing well. He reports good blood pressure readings at home, but has a history of white coat hypertension. He has underlying anxiety. He's had no recent chest pain. He does not have shortness of breath with low-level activity. He has resumed walking on a regular basis and is doing well with this. He denies edema, palpitations, orthopnea, or PND.  Outpatient Encounter Prescriptions as of 03/14/2014  Medication Sig  . acetaminophen (TYLENOL) 500 MG tablet Take 500 mg by mouth every 6 (six) hours as needed for moderate pain.  Marland Kitchen. aspirin EC 81 MG EC tablet Take 1 tablet (81 mg total) by mouth daily.  . carvedilol (COREG) 25 MG tablet Take 1 tablet (25 mg total) by mouth 2 (two) times daily with a meal.  . [START ON 03/15/2014] clopidogrel (PLAVIX) 75 MG tablet Take 1 tablet (75 mg total) by mouth daily.  Marland Kitchen. ezetimibe (ZETIA) 10 MG tablet Take 1 tablet (10 mg total) by mouth daily.  . feeding supplement (BOOST HIGH PROTEIN) LIQD Take 1 Container by mouth daily.  . isosorbide mononitrate (IMDUR) 30 MG 24 hr tablet Take 1 tablet (30 mg total) by mouth daily.  Marland Kitchen. LORazepam (ATIVAN) 0.5 MG tablet Take 0.5 mg by mouth 4 (four) times daily.   Marland Kitchen. losartan (COZAAR) 50 MG tablet Take 1 tablet (50 mg total) by mouth at bedtime.  . nitroGLYCERIN (NITROSTAT) 0.4 MG SL tablet Place 1 tablet (0.4 mg total) under the tongue every 5 (five) minutes as needed for chest pain (up to 3 doses).  . senna (SENOKOT) 8.6 MG TABS  tablet Take 1 tablet by mouth every other day.    Allergies  Allergen Reactions  . Morphine And Related     Extreme agitation (pulls out IVs)  . Statins     Causes muscle fatigue  . Codeine Other (See Comments)    Patient "is not himself", gets aggitated    Past Medical History  Diagnosis Date  . Hypertension   . Hyperlipidemia     a. intolerant to statins. b. Started on Zetia 09/2013.  Marland Kitchen. Anxiety   . Broken neck 2000  . CAD (coronary artery disease)     a. NSTEMI 09/2013: - s/p balloon angioplasty of D1, c/b dissection but flow restored with prolonged balloon inflations.  . VT (ventricular tachycardia)     a. On admission with NSTEMI 09/2013.  . Ischemic cardiomyopathy     a. 09/2013: EF 30%, d/c with Lifevest. b.  s/p STJ dual chamber ICD implant 01/2014 by Dr Johney FrameAllred  . Mitral regurgitation     a. Mild by echo 09/2013.  Marland Kitchen. Protein calorie malnutrition   . Arthritis     "right shoulder" (01/17/2014)    ROS: Negative except as per HPI  BP 150/82  Pulse 80  Ht 5\' 6"  (1.676 m)  Wt 125 lb 12.8 oz (57.063 kg)  BMI 20.31 kg/m2  PHYSICAL EXAM: Pt is alert and oriented, NAD HEENT: normal Neck: JVP - normal, carotids 2+= without bruits Lungs: CTA bilaterally with a prolonged expiratory phase  CV: RRR without murmur or gallop Abd: soft, NT, Positive BS, no hepatomegaly Ext: no C/C/E, distal pulses intact and equal Skin: warm/dry no rash  ASSESSMENT AND PLAN: 1. Severe ischemic cardiomyopathy with New York Heart Association functional class II symptoms. The patient appears stable. His blood pressure is well controlled at home on a combination of carvedilol and losartan. Will continue his same medical program and I will see him back in 6 months. Considering his history of myocardial infarction, severe residual LV dysfunction, and CHF, in my opinion he is medically disabled from his cardiac condition.  2. Hypertension. Home blood pressure control on current medications.  3.  Coronary atherosclerosis, native vessel. He will remain on aspirin and Plavix for 12 months from the time of his MI. He is on appropriate risk reduction program with the omission of a statin drug as he is statin intolerant.  4. Hyperlipidemia. The patient exactly as he is statin intolerant. Will repeat lipids prior to his return visit in 6 months.  5. Status post ICD. He had a pericardial effusion and RV lead perforation requiring lead revision. This is all stabilized at this point and he seems to be doing well. He has followup with Dr. Johney Frame next month.  6. Tobacco abuse. He quit smoking at the time of his MI.  Tonny Bollman 03/14/2014 9:08 AM

## 2014-03-19 ENCOUNTER — Telehealth: Payer: Self-pay | Admitting: Cardiovascular Disease

## 2014-03-19 NOTE — Telephone Encounter (Signed)
New Message:  Pt's wife states she is calling to speak to the nurse.. States her husband needs a paper stating he will not be returning to work per Dr. Excell Seltzer. She is requesting a call back from the nurse

## 2014-03-19 NOTE — Telephone Encounter (Signed)
I spoke with the pt's wife and she feels like the 03/14/14 office note is sufficient for employer.  I will mail office note to the pt's home.

## 2014-03-20 ENCOUNTER — Ambulatory Visit: Payer: BC Managed Care – PPO | Admitting: Cardiovascular Disease

## 2014-03-28 ENCOUNTER — Encounter: Payer: Self-pay | Admitting: Internal Medicine

## 2014-04-15 ENCOUNTER — Telehealth: Payer: Self-pay | Admitting: *Deleted

## 2014-04-15 ENCOUNTER — Telehealth: Payer: Self-pay | Admitting: Physician Assistant

## 2014-04-15 NOTE — Telephone Encounter (Signed)
Spoke with patient.  He states "His device is beeping."  I explained his device did not beep.  After checking throughout his house he discovered his cell phone in his pocket beeping due to battery depletion.  I did have him send a Merlin transmission and there were no alerts or episodes.

## 2014-04-15 NOTE — Telephone Encounter (Signed)
Patient called because his device it started beeping and he was concerned. They stated it was giving 2 beats every few minutes. It had been doing this for several hours. He is not having any chest pain, shortness of breath, or palpitations.  Contacted [employer] and they advised the device does not deep, it vibrates. They advised if the telephone check device is plugged in, it will automatically send an alert for any abnormal rhythms.  Contacted the patient and a paramedic was on the scene who stated the patient was out on the porch with no electronic devices around and he heard 2 beeps in a row. I requested they send a transmission.  Contacted [employer] at the office, she will contact the patient and have him come in for device check if needed.

## 2014-04-18 ENCOUNTER — Telehealth: Payer: Self-pay | Admitting: Cardiovascular Disease

## 2014-04-18 NOTE — Telephone Encounter (Signed)
New message    patient calling C/O cough at night.    PCP had advise patient to call the office.

## 2014-04-18 NOTE — Telephone Encounter (Signed)
I spoke with the pt's wife and the pt has been coughing and sneezing for about a week.  This is worse when the pt lays down at night. The pt has been taking an OTC allergy pill but this has not helped. The PCP advised that the pt contact our office to determine what the pt can take from a Cardiac standpoint.  I made the pt's wife aware that the pt can try Coricidin HBP for cough and cold. I recommended that he stop allergy pill at this time.

## 2014-04-24 ENCOUNTER — Other Ambulatory Visit: Payer: Self-pay | Admitting: Physician Assistant

## 2014-04-30 ENCOUNTER — Encounter: Payer: Self-pay | Admitting: Internal Medicine

## 2014-05-02 ENCOUNTER — Encounter: Payer: BC Managed Care – PPO | Admitting: Internal Medicine

## 2014-05-07 ENCOUNTER — Other Ambulatory Visit: Payer: Self-pay | Admitting: Physician Assistant

## 2014-05-07 ENCOUNTER — Encounter: Payer: Self-pay | Admitting: *Deleted

## 2014-05-07 NOTE — Telephone Encounter (Signed)
Patients daughter called stating that the zetia is too expensive at $245.00/mo and wants to know if he can be switched to something else. Please advise. Thanks, MI

## 2014-05-12 ENCOUNTER — Telehealth: Payer: Self-pay | Admitting: Cardiovascular Disease

## 2014-05-12 NOTE — Telephone Encounter (Signed)
New message     Zetia is too expensive want something else.  New ins will not pay.  Please call in something else to [facility]

## 2014-05-12 NOTE — Telephone Encounter (Signed)
Has he tried crestor 5 mg twice per week? If he's been intolerant to this, would refer to lipid clinic. thx

## 2014-05-12 NOTE — Telephone Encounter (Signed)
Wife calling stating they had to get new insurance since he lost his job.  It became effective 7/1 and the pharmacy said that the Zetia would be out of pocket about $250 and they can't afford to pay that much.  Has tried several statins in past but cause muscle fatigue and she said he had reaction to one of them but can't remember the name. Advised will send to Dr. Excell Seltzer for advise.

## 2014-05-15 MED ORDER — ROSUVASTATIN CALCIUM 5 MG PO TABS: ORAL_TABLET | ORAL | Status: DC

## 2014-05-15 NOTE — Telephone Encounter (Signed)
Spoke with Gary [name], pt is willing to try crestor 5 mg twice weekly. New script sent to the pharm. They will call with problems.

## 2014-05-15 NOTE — Telephone Encounter (Signed)
Left message for patient to call office.  

## 2014-05-18 IMAGING — CR DG CHEST 2V
2 series · 2 of 2 positions shown · non-contrast
Comparison: DG CHEST 2 VIEW dated 01/18/2014

CLINICAL DATA: History of hypertension

EXAM:
CHEST  2 VIEW

[w chest pa]
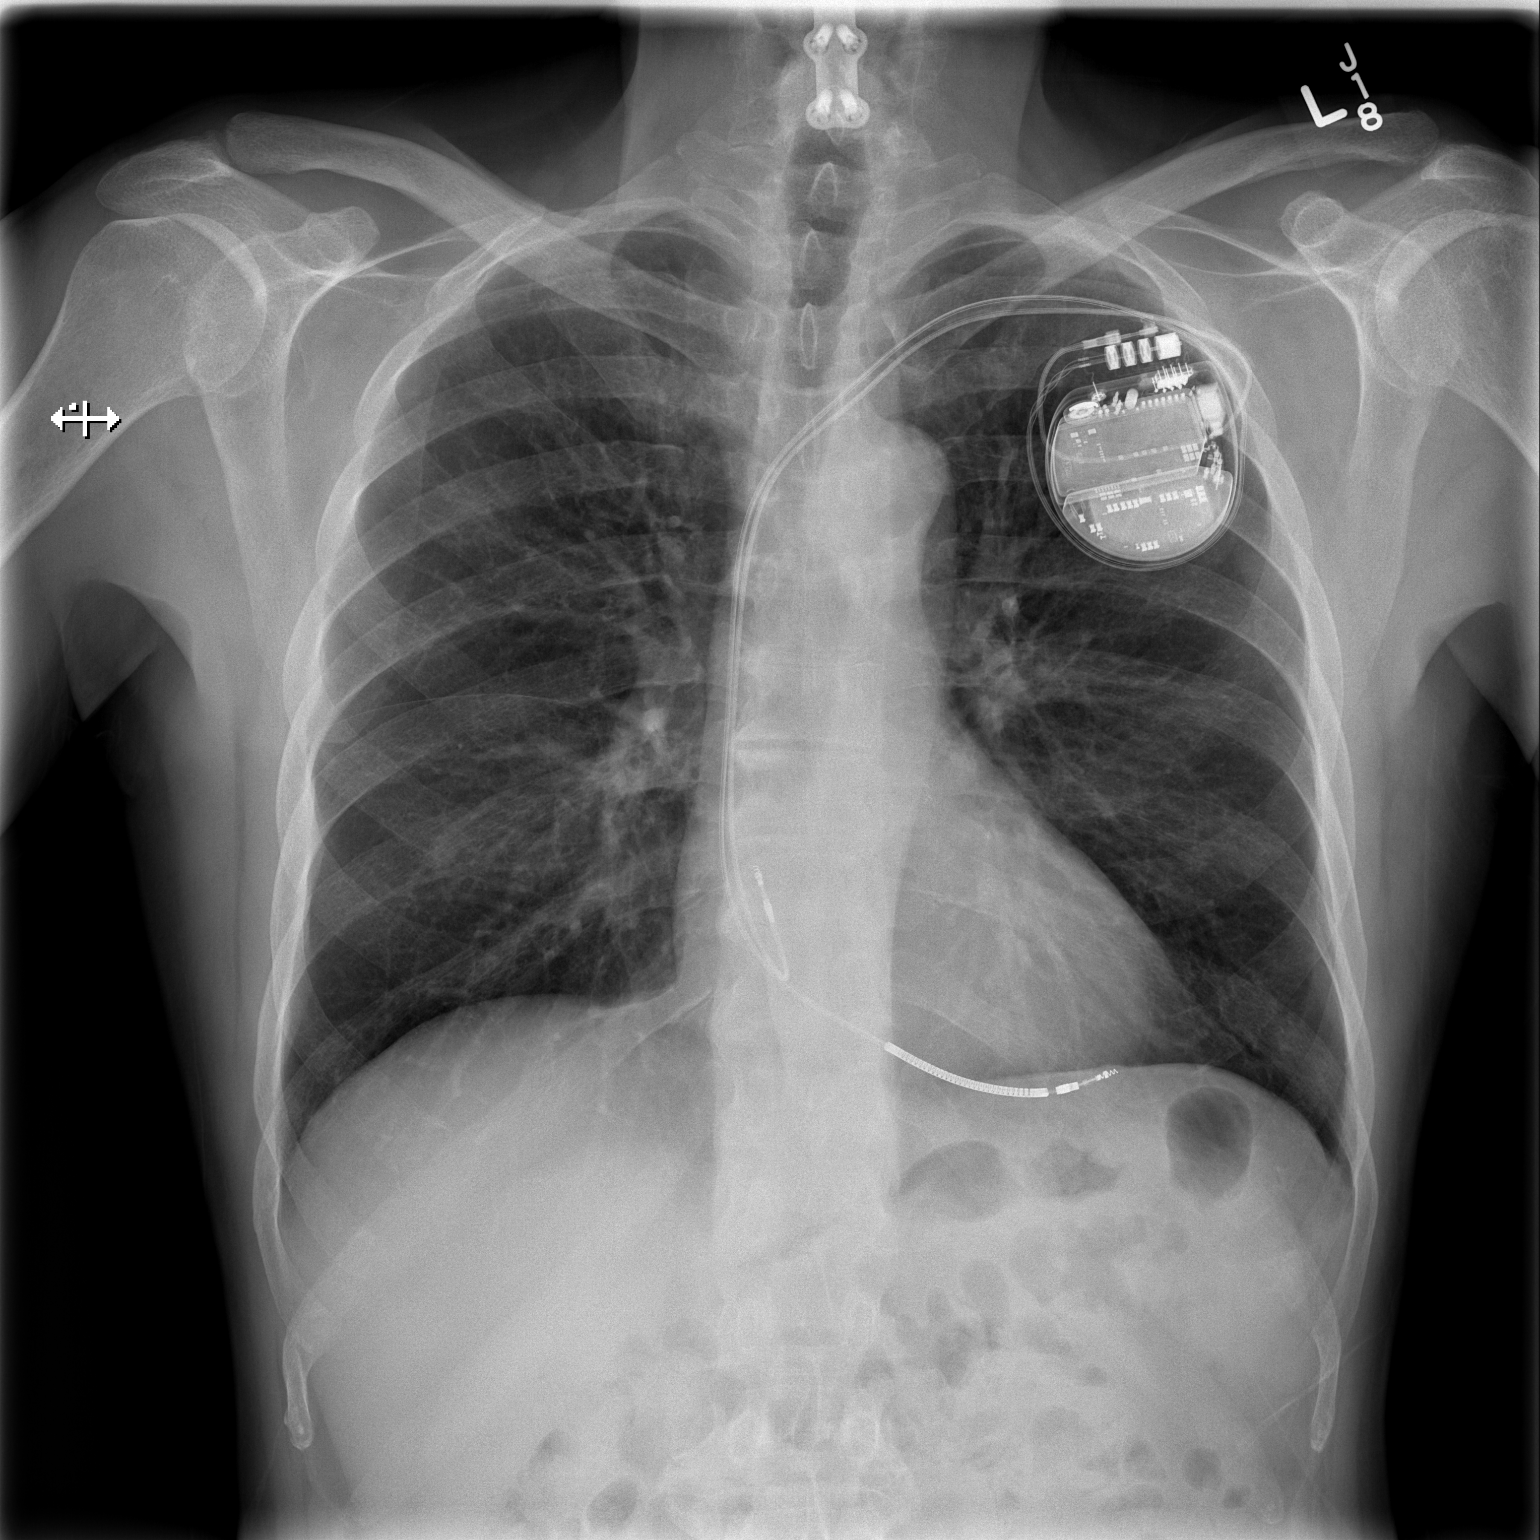

[w chest lat]
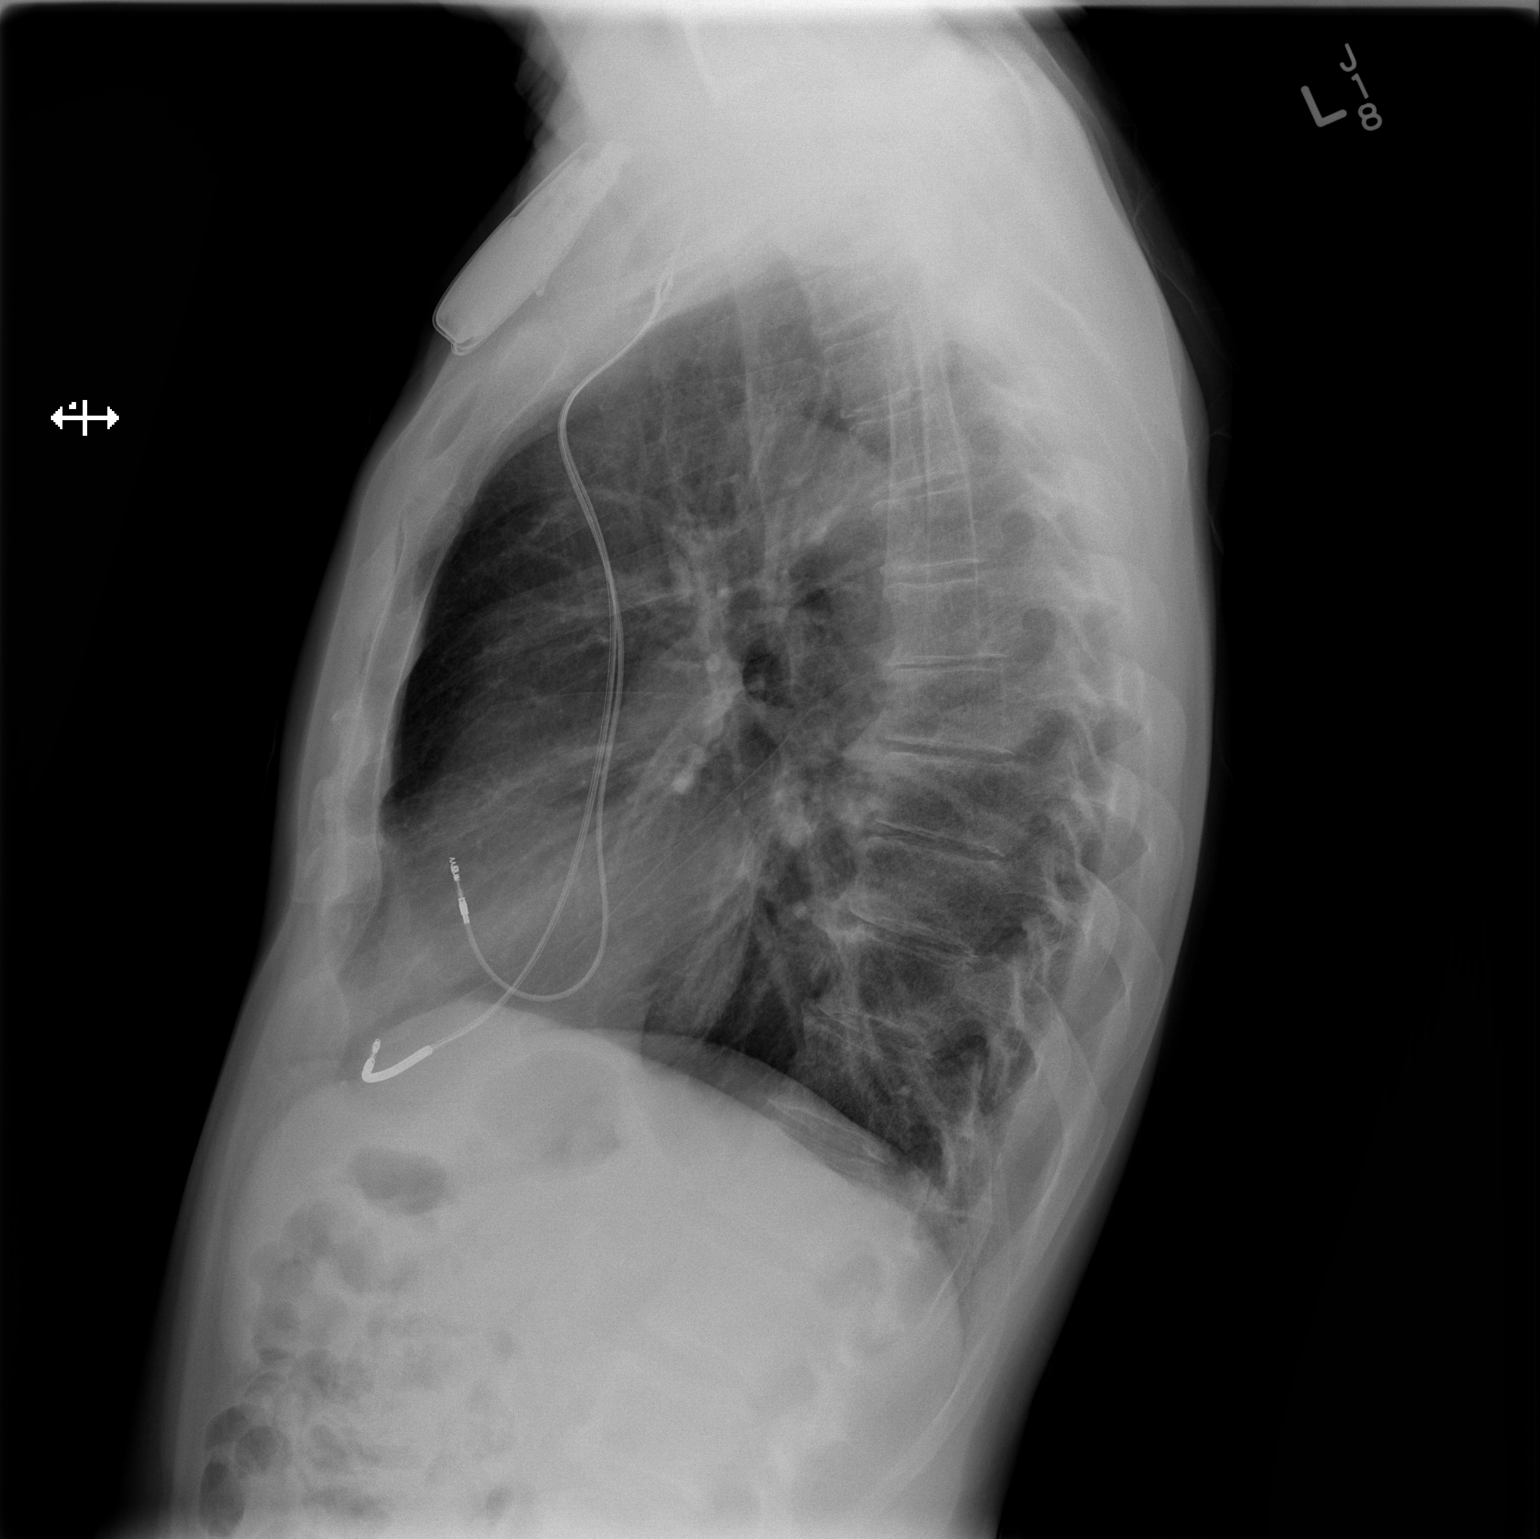

[2 of 2 positions shown; findings below may reference images not displayed]

FINDINGS: The heart size and mediastinal contours are within normal limits.
Both lungs are clear. The visualized skeletal structures are
unremarkable. Left chest wall dual chamber AICD.
IMPRESSION: No active cardiopulmonary disease.

## 2014-05-20 NOTE — Telephone Encounter (Signed)
This encounter was created in error - please disregard.

## 2014-05-21 ENCOUNTER — Encounter: Payer: BC Managed Care – PPO | Admitting: Internal Medicine

## 2014-05-26 ENCOUNTER — Other Ambulatory Visit: Payer: Self-pay

## 2014-05-28 ENCOUNTER — Encounter: Payer: Self-pay | Admitting: *Deleted

## 2014-05-28 ENCOUNTER — Encounter: Payer: Self-pay | Admitting: Internal Medicine

## 2014-05-28 ENCOUNTER — Encounter (INDEPENDENT_AMBULATORY_CARE_PROVIDER_SITE_OTHER): Payer: Self-pay

## 2014-05-28 ENCOUNTER — Ambulatory Visit (INDEPENDENT_AMBULATORY_CARE_PROVIDER_SITE_OTHER): Payer: BC Managed Care – PPO | Admitting: Internal Medicine

## 2014-05-28 VITALS — BP 174/80 | HR 74 | Ht 66.0 in | Wt 126.4 lb

## 2014-05-28 DIAGNOSIS — I10 Essential (primary) hypertension: Secondary | ICD-10-CM

## 2014-05-28 DIAGNOSIS — I2589 Other forms of chronic ischemic heart disease: Secondary | ICD-10-CM

## 2014-05-28 DIAGNOSIS — I4729 Other ventricular tachycardia: Secondary | ICD-10-CM

## 2014-05-28 DIAGNOSIS — I472 Ventricular tachycardia, unspecified: Secondary | ICD-10-CM

## 2014-05-28 DIAGNOSIS — I255 Ischemic cardiomyopathy: Secondary | ICD-10-CM

## 2014-05-28 LAB — MDC_IDC_ENUM_SESS_TYPE_INCLINIC
Battery Remaining Longevity: 97.2 mo
Brady Statistic RA Percent Paced: 1.4 %
Date Time Interrogation Session: 20150722163732
HighPow Impedance: 71 Ohm
Implantable Pulse Generator Model: 2411
Lead Channel Impedance Value: 537.5 Ohm
Lead Channel Pacing Threshold Amplitude: 1 V
Lead Channel Pacing Threshold Amplitude: 1 V
Lead Channel Pacing Threshold Amplitude: 1 V
Lead Channel Pacing Threshold Pulse Width: 0.5 ms
Lead Channel Pacing Threshold Pulse Width: 0.5 ms
Lead Channel Sensing Intrinsic Amplitude: 10.5 mV
Lead Channel Sensing Intrinsic Amplitude: 4.1 mV
Lead Channel Setting Pacing Amplitude: 2 V
Lead Channel Setting Pacing Amplitude: 2.5 V
Lead Channel Setting Pacing Pulse Width: 0.5 ms
Lead Channel Setting Sensing Sensitivity: 0.5 mV
MDC IDC MSMT LEADCHNL RA IMPEDANCE VALUE: 475 Ohm
MDC IDC MSMT LEADCHNL RA PACING THRESHOLD PULSEWIDTH: 0.5 ms
MDC IDC MSMT LEADCHNL RV PACING THRESHOLD AMPLITUDE: 1 V
MDC IDC MSMT LEADCHNL RV PACING THRESHOLD PULSEWIDTH: 0.5 ms
MDC IDC PG SERIAL: 7142670
MDC IDC SET ZONE DETECTION INTERVAL: 300 ms
MDC IDC STAT BRADY RV PERCENT PACED: 0.01 %
Zone Setting Detection Interval: 250 ms
Zone Setting Detection Interval: 330 ms

## 2014-05-28 NOTE — Patient Instructions (Addendum)
Your physician wants you to follow-up in: 12 months with Dr Johney Frame.  You will receive a reminder letter in the mail two months in advance. If you don't receive a letter, please call our office to schedule the follow-up appointment.   Remote monitoring is used to monitor your Pacemaker of ICD from home. This monitoring reduces the number of office visits required to check your device to one time per year. It allows [place] to keep an eye on the functioning of your device to ensure it is working properly. You are scheduled for a device check from home on 08/27/14. You may send your transmission at any time that day. If you have a wireless device, the transmission will be sent automatically. After your physician reviews your transmission, you will receive a postcard with your next transmission date.  Come in fasting to check your labs for Dr Gwenevere Abbot in system already  Sherri Rad, RN will call you about the [facility] clinic

## 2014-06-01 ENCOUNTER — Encounter: Payer: Self-pay | Admitting: Internal Medicine
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
## 2014-06-05 ENCOUNTER — Other Ambulatory Visit (INDEPENDENT_AMBULATORY_CARE_PROVIDER_SITE_OTHER): Payer: BC Managed Care – PPO

## 2014-06-05 DIAGNOSIS — I10 Essential (primary) hypertension: Secondary | ICD-10-CM

## 2014-06-05 DIAGNOSIS — I2589 Other forms of chronic ischemic heart disease: Secondary | ICD-10-CM

## 2014-06-05 DIAGNOSIS — I2510 Atherosclerotic heart disease of native coronary artery without angina pectoris: Secondary | ICD-10-CM

## 2014-06-05 LAB — BASIC METABOLIC PANEL
BUN: 15 mg/dL (ref 6–23)
CALCIUM: 9.3 mg/dL (ref 8.4–10.5)
CO2: 29 mEq/L (ref 19–32)
CREATININE: 0.8 mg/dL (ref 0.4–1.5)
Chloride: 106 mEq/L (ref 96–112)
GFR: 105.4 mL/min (ref >60.00)
Glucose, Bld: 116 mg/dL — ABNORMAL HIGH (ref 70–99)
Potassium: 3.9 mEq/L (ref 3.5–5.1)
Sodium: 141 mEq/L (ref 135–145)

## 2014-06-05 LAB — LIPID PANEL
CHOLESTEROL: 191 mg/dL (ref 0–200)
HDL: 30.6 mg/dL — AB (ref >39.00)
NonHDL: 160.4
TRIGLYCERIDES: 205 mg/dL — AB (ref 0.0–149.0)
Total CHOL/HDL Ratio: 6
VLDL: 41 mg/dL — ABNORMAL HIGH (ref 0.0–40.0)

## 2014-06-05 LAB — HEPATIC FUNCTION PANEL
ALBUMIN: 4 g/dL (ref 3.5–5.2)
ALK PHOS: 57 U/L (ref 39–117)
ALT: 15 U/L (ref 0–53)
AST: 16 U/L (ref 0–37)
Bilirubin, Direct: 0 mg/dL (ref 0.0–0.3)
Total Bilirubin: 0.7 mg/dL (ref 0.2–1.2)
Total Protein: 6.8 g/dL (ref 6.0–8.3)

## 2014-06-05 LAB — LDL CHOLESTEROL, DIRECT: Direct LDL: 117 mg/dL

## 2014-06-17 ENCOUNTER — Ambulatory Visit (INDEPENDENT_AMBULATORY_CARE_PROVIDER_SITE_OTHER): Payer: BC Managed Care – PPO | Admitting: Pharmacist

## 2014-06-17 VITALS — Wt 126.0 lb

## 2014-06-17 DIAGNOSIS — E7800 Pure hypercholesterolemia, unspecified: Secondary | ICD-10-CM

## 2014-06-17 DIAGNOSIS — Z79899 Other long term (current) drug therapy: Secondary | ICD-10-CM

## 2014-06-17 MED ORDER — ROSUVASTATIN CALCIUM 5 MG PO TABS: ORAL_TABLET | ORAL | Status: DC

## 2014-06-17 NOTE — Patient Instructions (Signed)
Plan: 1.  Will increase Crestor to 3 times per week today (Monday, Wednesday, Friday). 2.  In 07/2014, increase to 4 times per week. 3. In 08/2014, increase to 5 times per week. 4.  If you develop muscle or joint stiffness, you can try to add Co-Enzyme Q-10 supplement at dose of 200 mg daily 5.  If you still have muscle soreness, then stop Crestor for 1 week, and reduce back to the previous dose.   6.  Recheck blood work in 3 months (09/09/14 - fasting labs), and see Riki Rusk a few days later (09/16/14 at 8:30 am)

## 2014-06-17 NOTE — Progress Notes (Signed)
Patient is a 63 y.o. WM referred to lipid clinic by Dr. Excell Seltzerooper given h/o MI and intolerance to daily statins.  Patient tells me he has failed multiple statins in the past with his PCP [place]thia [name] as he developed muscle pains with them.  He states he was able to tolerate one for some time, but ultimately stopped due to a rash - he says it was taken off the market soon after (I suspect [employer]).  He took Zetia following his 08/2013 MI given h/o statin intolerances, but had to stop due to insurance change and the cost of Zetia was > $200 per month.  He started Crestor 5 mg biw a month ago, and is tolerating this well so far.  He tells me he is not interested in trying an injectable medication as he is fearful of needles.   He does have a history of insulin resistance.   RF:  CAD (h/o MI), HTN, IR  age, low HDL - LDL goal < 70, non-HDL goal <100. Meds:  Crestor 5 mg twice weekly Intolerant:  Multiple statins (he doesn't recall the names, but last one tried was years ago with his PCP) Tried:  Zetia, but had to stop due to cost (> $200 per month)  Social history:  Quit smoking 08/2013 following his MI.  Denies alcohol use. Family history:  No evidence of early CAD or diabetes in his family Diet:  Patient previously ate 3 carbs for evening meal, however a few weeks ago he has cut his carb intake in the evening in half.  Now:  Breakfast:  1 egg and toast in morning, and not interested in changing this.  Lunch is typically sandwich and vegetable.  Dinner is meat and 1-2 carbs, but limiting the size of carbs now.  Was drinking 2-3 sodas per day, and now down to 1-2 diet soda per day.  Doesn't eat desserts Exercise:  No regular aerobic activity  Labs:  06/2014:  TC 191, TG 205, HDL 31, LDL 117, non-HDL 160 (Crestor 5 mg biw started 2-3 weeks ago)   Current Outpatient Prescriptions  Medication Sig Dispense Refill  . acetaminophen (TYLENOL) 500 MG tablet Take 500 mg by mouth every 6 (six) hours  as needed for moderate pain.      Marland Kitchen. aspirin EC 81 MG EC tablet Take 1 tablet (81 mg total) by mouth daily.      . carvedilol (COREG) 25 MG tablet Take 1 tablet (25 mg total) by mouth 2 (two) times daily with a meal.  60 tablet  11  . clopidogrel (PLAVIX) 75 MG tablet Take 1 tablet (75 mg total) by mouth daily.  90 tablet  3  . feeding supplement (BOOST HIGH PROTEIN) LIQD Take 1 Container by mouth daily.      . isosorbide mononitrate (IMDUR) 30 MG 24 hr tablet Take 1 tablet (30 mg total) by mouth daily.  30 tablet  0  . LORazepam (ATIVAN) 0.5 MG tablet Take 0.5 mg by mouth 3 (three) times daily.       Marland Kitchen. losartan (COZAAR) 50 MG tablet TAKE 1 TABLET (50 MG TOTAL) BY MOUTH AT BEDTIME.  30 tablet  5  . nitroGLYCERIN (NITROSTAT) 0.4 MG SL tablet Place 1 tablet (0.4 mg total) under the tongue every 5 (five) minutes as needed for chest pain (up to 3 doses).  25 tablet  3  . rosuvastatin (CRESTOR) 5 MG tablet Take one tablet every Monday and friday  30 tablet  12  . senna (SENOKOT) 8.6 MG TABS tablet Take 1 tablet by mouth every other day.       No current facility-administered medications for this visit.   Allergies  Allergen Reactions  . Morphine And Related     Extreme agitation (pulls out IVs)  . Statins     Causes muscle fatigue  . Codeine Other (See Comments)    Patient "is not himself", gets aggitated   No family history on file.

## 2014-06-17 NOTE — Assessment & Plan Note (Signed)
Patient hasn't been able to tolerate daily statins in past, but has been able to tolerate Crestor 5 mg biw for past month.  He doesn't recall ever taking Crestor in the past, and denies trying Livalo either.  Zetia wasn't covered by insurance and was going to cost him $200 per month which was too high.  He is not interested in injectable meds, so PCSK-9 inhibitors not an option.  Patient agreeable to continue to titrate Crestor slowly over the next 3 months, and will add Co-Q 10 if he starts noticing muscle aches.  We discussed limiting carbohydrates in diet given his elevated glucose, TG, LDL, and non-HDL.  He agrees to work on this.  Will hopefully be able to get up to Crestor fives times per week before next panel in 3 months.  He will call back if he can't tolerate Crestor.

## 2014-07-03 ENCOUNTER — Encounter: Payer: Self-pay | Admitting: *Deleted

## 2014-07-24 ENCOUNTER — Encounter: Payer: Self-pay | Admitting: *Deleted

## 2014-07-24 ENCOUNTER — Ambulatory Visit (INDEPENDENT_AMBULATORY_CARE_PROVIDER_SITE_OTHER): Payer: BC Managed Care – PPO | Admitting: *Deleted

## 2014-07-24 DIAGNOSIS — Z95810 Presence of automatic (implantable) cardiac defibrillator: Secondary | ICD-10-CM

## 2014-07-24 DIAGNOSIS — I255 Ischemic cardiomyopathy: Secondary | ICD-10-CM

## 2014-07-24 DIAGNOSIS — I2589 Other forms of chronic ischemic heart disease: Secondary | ICD-10-CM

## 2014-07-24 NOTE — Progress Notes (Addendum)
EPIC Encounter for ICM Monitoring  Patient Name: Gary [name] is a 63 y.o. male Date: 07/24/2014 Primary Care Physican: Samuel Jester, DO Primary Cardiologist: Excell Seltzer Electrophysiologist: Allred Dry Weight: 119 lbs       In the past month, have you:  1. Gained more than 2 pounds in a day or more than 5 pounds in a week? No. The patient's weight at home has been running 119 for several weeks. This may flucuate by ounces only.  2. Had changes in your medications (with verification of current medications)? no  3. Had more shortness of breath than is usual for you? no  4. Limited your activity because of shortness of breath? no  5. Not been able to sleep because of shortness of breath? no  6. Had increased swelling in your feet or ankles? no  7. Had symptoms of dehydration (dizziness, dry mouth, increased thirst, decreased urine output) no  8. Had changes in sodium restriction? The patient does admit to drinking some sodas. He occasionally has canned foods.  9. Been compliant with medication? Yes   ICM trend:   Follow-up plan: ICM clinic phone appointment: 08/27/14- full transmission. The patient's corvue reading has been up since ~8/19. He is symptom free at this time and weights are stable. Low sodium diet information will be sent to the patient. I advised him that I will forward his readings to Dr. Johney Frame and Dr. Excell Seltzer to see if any medication changes are needed at this time. He is currently not on any diuretics. Most recent BMP from 06/05/14- K+/ BUN/ Creatinine- 3.9/ 15/0.8. I will call him back with any recommendations. He is agreeable.  Copy of note sent to patient's primary care physician, primary cardiologist, and device following physician.  Sherri Rad, RN, BSN 07/24/2014 3:11 PM  Corview has been quite elevated recently.  Will add daily lasix.  Will need repeat BMET in 1 week and follow corview going forward.  ** 9/21- I spoke with the patient and his wife. They  do not wish to start on daily lasix at this time. Per the patient's wife, she states that the patient was drinking a lot of mountain dew in the most recent weeks. She had recently had a discussion with him on cutting back on this. Per her account, the patient had drank a lot of soda the day prior to his transmission. He did not relay this to me. I have explained to the patient that we can recheck his Corvue readings in one week and see how he is doing with more sodium compliance in his diet. I have made them aware that should his readings still be elevated, he should start a low dose of lasix per Dr. Johney Frame. They are agreeable. I will recheck his corvue readings

## 2014-07-24 NOTE — Patient Instructions (Signed)
Low-Sodium Eating Plan Sodium raises blood pressure and causes water to be held in the body. Getting less sodium from food will help lower your blood pressure, reduce any swelling, and protect your heart, liver, and kidneys. We get sodium by adding salt (sodium chloride) to food. Most of our sodium comes from canned, boxed, and frozen foods. Restaurant foods, fast foods, and pizza are also very high in sodium. Even if you take medicine to lower your blood pressure or to reduce fluid in your body, getting less sodium from your food is important. WHAT IS MY PLAN? Most people should limit their sodium intake to 2,300 mg a day. Your health care provider recommends that you limit your sodium intake to __2000 mg________ a day.  WHAT DO I NEED TO KNOW ABOUT THIS EATING PLAN? For the low-sodium eating plan, you will follow these general guidelines:  Choose foods with a % Daily Value for sodium of less than 5% (as listed on the food label).   Use salt-free seasonings or herbs instead of table salt or sea salt.   Check with your health care provider or pharmacist before using salt substitutes.   Eat fresh foods.  Eat more vegetables and fruits.  Limit canned vegetables. If you do use them, rinse them well to decrease the sodium.   Limit cheese to 1 oz (28 g) per day.   Eat lower-sodium products, often labeled as "lower sodium" or "no salt added."  Avoid foods that contain monosodium glutamate (MSG). MSG is sometimes added to Chinese food and some canned foods.  Check food labels (Nutrition Facts labels) on foods to learn how much sodium is in one serving.  Eat more home-cooked food and less restaurant, buffet, and fast food.  When eating at a restaurant, ask that your food be prepared with less salt or none, if possible.  HOW DO I READ FOOD LABELS FOR SODIUM INFORMATION? The Nutrition Facts label lists the amount of sodium in one serving of the food. If you eat more than one serving,  you must multiply the listed amount of sodium by the number of servings. Food labels may also identify foods as:  Sodium free--Less than 5 mg in a serving.  Very low sodium--35 mg or less in a serving.  Low sodium--140 mg or less in a serving.  Light in sodium--50% less sodium in a serving. For example, if a food that usually has 300 mg of sodium is changed to become light in sodium, it will have 150 mg of sodium.  Reduced sodium--25% less sodium in a serving. For example, if a food that usually has 400 mg of sodium is changed to reduced sodium, it will have 300 mg of sodium. WHAT FOODS CAN I EAT? Grains Low-sodium cereals, including oats, puffed wheat and rice, and shredded wheat cereals. Low-sodium crackers. Unsalted rice and pasta. Lower-sodium bread.  Vegetables Frozen or fresh vegetables. Low-sodium or reduced-sodium canned vegetables. Low-sodium or reduced-sodium tomato sauce and paste. Low-sodium or reduced-sodium tomato and vegetable juices.  Fruits Fresh, frozen, and canned fruit. Fruit juice.  Meat and Other Protein Products Low-sodium canned tuna and salmon. Fresh or frozen meat, poultry, seafood, and fish. Lamb. Unsalted nuts. Dried beans, peas, and lentils without added salt. Unsalted canned beans. Homemade soups without salt. Eggs.  Dairy Milk. Soy milk. Ricotta cheese. Low-sodium or reduced-sodium cheeses. Yogurt.  Condiments Fresh and dried herbs and spices. Salt-free seasonings. Onion and garlic powders. Low-sodium varieties of mustard and ketchup. Lemon juice.  Fats and   Oils Reduced-sodium salad dressings. Unsalted butter.  Other Unsalted popcorn and pretzels.  The items listed above may not be a complete list of recommended foods or beverages. Contact your dietitian for more options. WHAT FOODS ARE NOT RECOMMENDED? Grains Instant hot cereals. Bread stuffing, pancake, and biscuit mixes. Croutons. Seasoned rice or pasta mixes. Noodle soup cups. Boxed or  frozen macaroni and cheese. Self-rising flour. Regular salted crackers. Vegetables Regular canned vegetables. Regular canned tomato sauce and paste. Regular tomato and vegetable juices. Frozen vegetables in sauces. Salted french fries. Olives. Pickles. Relishes. Sauerkraut. Salsa. Meat and Other Protein Products Salted, canned, smoked, spiced, or pickled meats, seafood, or fish. Bacon, ham, sausage, hot dogs, corned beef, chipped beef, and packaged luncheon meats. Salt pork. Jerky. Pickled herring. Anchovies, regular canned tuna, and sardines. Salted nuts. Dairy Processed cheese and cheese spreads. Cheese curds. Blue cheese and cottage cheese. Buttermilk.  Condiments Onion and garlic salt, seasoned salt, table salt, and sea salt. Canned and packaged gravies. Worcestershire sauce. Tartar sauce. Barbecue sauce. Teriyaki sauce. Soy sauce, including reduced sodium. Steak sauce. Fish sauce. Oyster sauce. Cocktail sauce. Horseradish. Regular ketchup and mustard. Meat flavorings and tenderizers. Bouillon cubes. Hot sauce. Tabasco sauce. Marinades. Taco seasonings. Relishes. Fats and Oils Regular salad dressings. Salted butter. Margarine. Ghee. Bacon fat.  Other Potato and tortilla chips. Corn chips and puffs. Salted popcorn and pretzels. Canned or dried soups. Pizza. Frozen entrees and pot pies.  The items listed above may not be a complete list of foods and beverages to avoid. Contact your dietitian for more information. Document Released: 04/15/2002 Document Revised: 10/29/2013 Document Reviewed: 08/28/2013 Exit[employer] Patient Information 2015 [employer]. This information is not intended to replace advice given to you by your health care provider. Make sure you discuss any questions you have with your health care provider.  

## 2014-07-28 ENCOUNTER — Telehealth: Payer: Self-pay | Admitting: *Deleted

## 2014-07-28 ENCOUNTER — Encounter: Payer: Self-pay | Admitting: *Deleted

## 2014-07-28 NOTE — Telephone Encounter (Signed)
I spoke with the patient and his wife. They do not wish to start on daily lasix at this time. Per the patient's wife, she states that the patient was drinking a lot of mountain dew in the most recent weeks. She had recently had a discussion with him on cutting back on this. Per her account, the patient had drank a lot of soda the day prior to his transmission. He did not relay this to me. I have explained to the patient that we can recheck his Corvue readings in one week and see how he is doing with more sodium compliance in his diet. I have made them aware that should his readings still be elevated, he should start a low dose of lasix per Dr. Johney Frame. They are agreeable. I will recheck his corvue readings on 9/28.

## 2014-07-28 NOTE — Telephone Encounter (Signed)
I left a message for the patient to call regarding last weeks ICM transmission. Corvue readings were elevated and Dr. Johney Frame wants the patient to begin lasix 20 mg daily with a repeat BMP next week.

## 2014-08-04 ENCOUNTER — Ambulatory Visit (INDEPENDENT_AMBULATORY_CARE_PROVIDER_SITE_OTHER): Payer: BC Managed Care – PPO | Admitting: *Deleted

## 2014-08-04 ENCOUNTER — Encounter: Payer: Self-pay | Admitting: *Deleted

## 2014-08-04 ENCOUNTER — Telehealth: Payer: Self-pay | Admitting: Cardiology

## 2014-08-04 DIAGNOSIS — Z95810 Presence of automatic (implantable) cardiac defibrillator: Secondary | ICD-10-CM

## 2014-08-04 DIAGNOSIS — I509 Heart failure, unspecified: Secondary | ICD-10-CM

## 2014-08-04 NOTE — Telephone Encounter (Signed)
Confirmed remote transmission 

## 2014-08-04 NOTE — Progress Notes (Signed)
EPIC Encounter for ICM Monitoring  Patient Name: Gary [name] is a 63 y.o. male Date: 08/04/2014 Primary Care Physican: Samuel Jester, DO Primary Cardiologist: Excell Seltzer Electrophysiologist: Allred Dry Weight: 119 lbs       In the past month, have you:  1. Gained more than 2 pounds in a day or more than 5 pounds in a week? No. Per the patient's wife, his weight is down to 116.8 lbs today. Over the last week, he has cut out his mountain dews.  2. Had changes in your medications (with verification of current medications)? no  3. Had more shortness of breath than is usual for you? no  4. Limited your activity because of shortness of breath? no  5. Not been able to sleep because of shortness of breath? no  6. Had increased swelling in your feet or ankles? no  7. Had symptoms of dehydration (dizziness, dry mouth, increased thirst, decreased urine output) no  8. Had changes in sodium restriction? no  9. Been compliant with medication? Yes   ICM trend:   Follow-up plan: ICM clinic phone appointment: 09/04/14. This was a 1 week follow up of the patient's elevated corvue readings from his transmission on 9/17. The patient's wife explained at that time that he had been drinking quite a bit of mountain dew, which he has cut out. She has been monitoring his sodium very well and his impedence is back to baseline over the last week and his weight is down.  Copy of note sent to patient's primary care physician, primary cardiologist, and device following physician.  Sherri Rad, RN, BSN 08/04/2014 4:04 PM

## 2014-08-15 ENCOUNTER — Telehealth: Payer: Self-pay | Admitting: *Deleted

## 2014-08-19 ENCOUNTER — Other Ambulatory Visit: Payer: BC Managed Care – PPO

## 2014-09-04 ENCOUNTER — Encounter: Payer: Self-pay | Admitting: Internal Medicine

## 2014-09-04 ENCOUNTER — Ambulatory Visit (INDEPENDENT_AMBULATORY_CARE_PROVIDER_SITE_OTHER): Payer: BC Managed Care – PPO | Admitting: *Deleted

## 2014-09-04 DIAGNOSIS — I472 Ventricular tachycardia, unspecified: Secondary | ICD-10-CM

## 2014-09-04 DIAGNOSIS — I255 Ischemic cardiomyopathy: Secondary | ICD-10-CM

## 2014-09-04 DIAGNOSIS — Z95810 Presence of automatic (implantable) cardiac defibrillator: Secondary | ICD-10-CM

## 2014-09-04 LAB — MDC_IDC_ENUM_SESS_TYPE_REMOTE
Battery Remaining Longevity: 91 mo
Battery Remaining Percentage: 89 %
Battery Voltage: 3.1 V
HIGH POWER IMPEDANCE MEASURED VALUE: 72 Ohm
HIGH POWER IMPEDANCE MEASURED VALUE: 72 Ohm
Implantable Pulse Generator Model: 2411
Implantable Pulse Generator Serial Number: 7142670
Lead Channel Impedance Value: 430 Ohm
Lead Channel Impedance Value: 510 Ohm
Lead Channel Pacing Threshold Amplitude: 1 V
Lead Channel Pacing Threshold Pulse Width: 0.5 ms
Lead Channel Sensing Intrinsic Amplitude: 4.4 mV
Lead Channel Setting Pacing Amplitude: 2 V
Lead Channel Setting Pacing Pulse Width: 0.5 ms
MDC IDC MSMT LEADCHNL RV PACING THRESHOLD AMPLITUDE: 1 V
MDC IDC MSMT LEADCHNL RV PACING THRESHOLD PULSEWIDTH: 0.5 ms
MDC IDC MSMT LEADCHNL RV SENSING INTR AMPL: 7.1 mV
MDC IDC SESS DTM: 20151029060018
MDC IDC SET LEADCHNL RV PACING AMPLITUDE: 2.5 V
MDC IDC SET LEADCHNL RV SENSING SENSITIVITY: 0.5 mV
MDC IDC SET ZONE DETECTION INTERVAL: 300 ms
MDC IDC SET ZONE DETECTION INTERVAL: 330 ms
MDC IDC STAT BRADY AP VP PERCENT: 1 %
MDC IDC STAT BRADY AP VS PERCENT: 3.9 %
MDC IDC STAT BRADY AS VP PERCENT: 1 %
MDC IDC STAT BRADY AS VS PERCENT: 96 %
MDC IDC STAT BRADY RA PERCENT PACED: 3.9 %
MDC IDC STAT BRADY RV PERCENT PACED: 1 %
Zone Setting Detection Interval: 250 ms

## 2014-09-04 NOTE — Progress Notes (Signed)
Remote ICD transmission.   

## 2014-09-05 ENCOUNTER — Other Ambulatory Visit (INDEPENDENT_AMBULATORY_CARE_PROVIDER_SITE_OTHER): Payer: BC Managed Care – PPO | Admitting: *Deleted

## 2014-09-05 DIAGNOSIS — E7800 Pure hypercholesterolemia, unspecified: Secondary | ICD-10-CM

## 2014-09-05 DIAGNOSIS — Z79899 Other long term (current) drug therapy: Secondary | ICD-10-CM

## 2014-09-05 LAB — LIPID PANEL
CHOL/HDL RATIO: 6
Cholesterol: 169 mg/dL (ref 0–200)
HDL: 27.2 mg/dL — ABNORMAL LOW (ref >39.00)
LDL Cholesterol: 103 mg/dL — ABNORMAL HIGH (ref 0–99)
NonHDL: 141.8
TRIGLYCERIDES: 193 mg/dL — AB (ref 0.0–149.0)
VLDL: 38.6 mg/dL (ref 0.0–40.0)

## 2014-09-05 LAB — HEPATIC FUNCTION PANEL
ALK PHOS: 59 U/L (ref 39–117)
ALT: 18 U/L (ref 0–53)
AST: 15 U/L (ref 0–37)
Albumin: 3.7 g/dL (ref 3.5–5.2)
BILIRUBIN DIRECT: 0 mg/dL (ref 0.0–0.3)
BILIRUBIN TOTAL: 0.7 mg/dL (ref 0.2–1.2)
TOTAL PROTEIN: 7.1 g/dL (ref 6.0–8.3)

## 2014-09-08 ENCOUNTER — Encounter: Payer: Self-pay | Admitting: *Deleted

## 2014-09-08 NOTE — Progress Notes (Addendum)
EPIC Encounter for ICM Monitoring  Patient Name: Gary [name] is a 63 y.o. male Date: 09/08/2014 Primary Care Physican: Samuel Jester, DO Primary Cardiologist: Excell Seltzer Electrophysiologist: Allred  Dry Weight: 117 lbs       In the past month, have you:  1. Gained more than 2 pounds in a day or more than 5 pounds in a week? No. Weights have been down to 116 lbs.  2. Had changes in your medications (with verification of current medications)? no  3. Had more shortness of breath than is usual for you? no  4. Limited your activity because of shortness of breath? no  5. Not been able to sleep because of shortness of breath? no  6. Had increased swelling in your feet or ankles? no  7. Had symptoms of dehydration (dizziness, dry mouth, increased thirst, decreased urine output) no  8. Had changes in sodium restriction? The patient did cut back on his soda intake last month due to elevated corvue readings. A follow up transmission 1 week later showed his impedence to be back to baseline. I am uncertain of his dietary compliance. Per his wife, she tries to watch him very closely with his diet, but she has been going to school and is uncertain of what he is doing in her abscence. The patient denies increase sodium intake.   9. Been compliant with medication? Yes   ICM trend:   Follow-up plan: ICM clinic phone appointment: 10/09/14. The patient's impedence was below baseline from ~ 10/7-10/24. Per the patient and his wife, he has had no real change in his diet. The patient did have a cold earlier this month and was thinks he may have been drinking a little extra fluid during that time. He is not on any diuretic therapy. He will follow up with Dr. Excell Seltzer tomorrow.  Copy of note sent to patient's primary care physician, primary cardiologist, and device following physician.  Sherri Rad, RN, BSN 09/08/2014 4:21 PM

## 2014-09-08 NOTE — Addendum Note (Signed)
Addended by: Sherri Rad C on: 09/08/2014 04:27 PM   Modules accepted: Level of Service

## 2014-09-09 ENCOUNTER — Other Ambulatory Visit: Payer: BC Managed Care – PPO

## 2014-09-11 ENCOUNTER — Ambulatory Visit (INDEPENDENT_AMBULATORY_CARE_PROVIDER_SITE_OTHER): Payer: BC Managed Care – PPO | Admitting: Cardiovascular Disease

## 2014-09-11 ENCOUNTER — Encounter: Payer: Self-pay | Admitting: Cardiovascular Disease

## 2014-09-11 VITALS — BP 150/84 | HR 65 | Ht 66.0 in | Wt 123.4 lb

## 2014-09-11 DIAGNOSIS — I5022 Chronic systolic (congestive) heart failure: Secondary | ICD-10-CM

## 2014-09-11 NOTE — Patient Instructions (Signed)
Your physician has recommended you make the following change in your medication: STOP Plavix  Your physician wants you to follow-up in: 6 MONTHS with Dr Excell Seltzer.  You will receive a reminder letter in the mail two months in advance. If you don't receive a letter, please call our office to schedule the follow-up appointment.

## 2014-09-11 NOTE — Progress Notes (Signed)
Background: the patient is followed for coronary artery disease and severe ischemic cardiomyopathy. He initially presented in 2014 with non-ST elevation MI and was found to have critical stenosis of the first diagonal branch. He underwent balloon angioplasty. LVEF at that time was 30%. He's undergone ICD implant  In the setting of severe LV dysfunction.  HPI:  63 year old gentleman presenting for follow-up evaluation.the patient is doing fairly well. He has no chest pain or pressure. He denies any bleeding or bruising problems on a combination of aspirin and Plavix. Home blood pressures have been in a good range with average systolic readings approximately 115 mmHg. He has a long history of white coat hypertension. He's been physically active with regular walking. He denies chest pain, chest pressure, edema, apnea, or PND. He has mild shortness of breath.  Studies:  2-D echocardiogram 11/26/2013: Left ventricle: The cavity size was moderately dilated. Wall thickness was normal. The estimated ejection fraction was 30%. Diffuse hypokinesis.  ------------------------------------------------------------ Aortic valve:  Mildly calcified leaflets. Doppler:  There was no stenosis.  No regurgitation.  ------------------------------------------------------------ Mitral valve:  Mildly thickened leaflets . Doppler: Trivial regurgitation.  ------------------------------------------------------------ Left atrium: The atrium was normal in size.  ------------------------------------------------------------ Atrial septum: No defect or patent foramen ovale was identified.  ------------------------------------------------------------ Right ventricle: The cavity size was normal. Wall thickness was normal. Systolic function was normal.  ------------------------------------------------------------ Pulmonic valve:  Structurally normal valve.  Cusp separation was normal. Doppler:  Transvalvular velocity was within the normal range. Mild regurgitation.  ------------------------------------------------------------ Tricuspid valve:  Structurally normal valve.  Leaflet separation was normal. Doppler: Transvalvular velocity was within the normal range. Mild regurgitation.  ------------------------------------------------------------ Right atrium: The atrium was normal in size.  ------------------------------------------------------------ Pericardium: The pericardium was normal in appearance.   Outpatient Encounter Prescriptions as of 09/11/2014  Medication Sig  . acetaminophen (TYLENOL) 500 MG tablet Take 500 mg by mouth every 6 (six) hours as needed for moderate pain.  Marland Kitchen aspirin EC 81 MG EC tablet Take 1 tablet (81 mg total) by mouth daily.  . carvedilol (COREG) 25 MG tablet Take 1 tablet (25 mg total) by mouth 2 (two) times daily with a meal.  . clopidogrel (PLAVIX) 75 MG tablet Take 1 tablet (75 mg total) by mouth daily.  . feeding supplement (BOOST HIGH PROTEIN) LIQD Take 1 Container by mouth daily.  . isosorbide mononitrate (IMDUR) 30 MG 24 hr tablet Take 1 tablet (30 mg total) by mouth daily.  Marland Kitchen LORazepam (ATIVAN) 0.5 MG tablet Take 0.5 mg by mouth 3 (three) times daily.   Marland Kitchen losartan (COZAAR) 50 MG tablet TAKE 1 TABLET (50 MG TOTAL) BY MOUTH AT BEDTIME.  . nitroGLYCERIN (NITROSTAT) 0.4 MG SL tablet Place 1 tablet (0.4 mg total) under the tongue every 5 (five) minutes as needed for chest pain (up to 3 doses).  . rosuvastatin (CRESTOR) 5 MG tablet Take one tablet three times per week  . senna (SENOKOT) 8.6 MG TABS tablet Take 1 tablet by mouth every other day.    Allergies  Allergen Reactions  . Morphine And Related     Extreme agitation (pulls out IVs)  . Statins     Causes muscle fatigue  . Codeine Other (See Comments)    Patient "is not himself", gets aggitated    Past Medical History  Diagnosis Date  . Hypertension   . Hyperlipidemia      a. intolerant to statins. b. Started on Zetia 09/2013.  Marland Kitchen Anxiety   . Broken neck 2000  .  CAD (coronary artery disease)     a. NSTEMI 09/2013: - s/p balloon angioplasty of D1, c/b dissection but flow restored with prolonged balloon inflations.  . VT (ventricular tachycardia)     a. On admission with NSTEMI 09/2013.  . Ischemic cardiomyopathy     a. 09/2013: EF 30%, d/c with Lifevest. b.  s/p STJ dual chamber ICD implant 01/2014 by Dr Johney FrameAllred  . Mitral regurgitation     a. Mild by echo 09/2013.  Marland Kitchen. Protein calorie malnutrition   . Arthritis     "right shoulder" (01/17/2014)    family history is not on file.   ROS: Negative except as per HPI  BP 150/84 mmHg  Pulse 65  Ht 5\' 6"  (1.676 m)  Wt 123 lb 6.4 oz (55.974 kg)  BMI 19.93 kg/m2  PHYSICAL EXAM: Pt is alert and oriented, NAD HEENT: normal Neck: JVP - normal, carotids 2+= without bruits Lungs: prolonged expiratory phase CV: regular with distant heart sounds, no murmur or gallop Abd: soft, NT, Positive BS, no hepatomegaly Ext: no C/C/E, distal pulses intact and equal Skin: warm/dry no rash  ASSESSMENT AND PLAN: 1. Severe ischemic cardiomyopathy, New York Heart Association class 2 systolic heart failure.the patient will continue on a combination of carvedilol, isosorbide, and losartan. Overall he is doing well.  2. Coronary artery disease, native vessel.no anginal symptoms. He has 12 months out from his non-ST elevation infarction. Will discontinue Plavix. He should remain on aspirin 81 mg daily.  3. Hyperlipidemia. Now followed in the lipid clinic. He is tolerating Crestor 5 mg currently taking 2 days per week and limited by side effects. Lipid panel: Lipid Panel     Component Value Date/Time   CHOL 169 09/05/2014 0739   TRIG 193.0* 09/05/2014 0739   HDL 27.20* 09/05/2014 0739   CHOLHDL 6 09/05/2014 0739   VLDL 38.6 09/05/2014 0739   LDLCALC 103* 09/05/2014 0739   LDLDIRECT 117.0 06/05/2014 0749   Tonny BollmanMichael Jeriah Corkum,  MD 09/11/2014 10:15 AM

## 2014-09-16 ENCOUNTER — Ambulatory Visit: Payer: BC Managed Care – PPO | Admitting: Pharmacist

## 2014-09-24 ENCOUNTER — Encounter: Payer: Self-pay | Admitting: Cardiology

## 2014-10-08 ENCOUNTER — Telehealth: Payer: Self-pay | Admitting: Cardiovascular Disease

## 2014-10-08 NOTE — Telephone Encounter (Signed)
Informed that dr/nurse will call back tomorrow, pt accepting of plan.

## 2014-10-08 NOTE — Telephone Encounter (Signed)
New Msg  Patient wife calling to get assistance with paperwork for long term disability, states she was told to call if she had questions. Please contact at [phone].

## 2014-10-09 ENCOUNTER — Encounter: Payer: Self-pay | Admitting: *Deleted

## 2014-10-09 ENCOUNTER — Ambulatory Visit (INDEPENDENT_AMBULATORY_CARE_PROVIDER_SITE_OTHER): Payer: BC Managed Care – PPO | Admitting: *Deleted

## 2014-10-09 DIAGNOSIS — I255 Ischemic cardiomyopathy: Secondary | ICD-10-CM

## 2014-10-09 DIAGNOSIS — Z95810 Presence of automatic (implantable) cardiac defibrillator: Secondary | ICD-10-CM

## 2014-10-09 NOTE — Progress Notes (Signed)
EPIC Encounter for ICM Monitoring  Patient Name: Gary [name] is a 63 y.o. male Date: 10/09/2014 Primary Care Physican: Samuel Jester, DO Primary Cardiologist: Excell Seltzer Electrophysiologist: Allred Dry Weight: 117 lbs       In the past month, have you:  1. Gained more than 2 pounds in a day or more than 5 pounds in a week? No. Per the patient's wife, his weight was down to 116 lbs today.  2. Had changes in your medications (with verification of current medications)? Yes. The patient was seen by Dr. Excell Seltzer on 09/11/14 and plavix was discontinued.  3. Had more shortness of breath than is usual for you? no  4. Limited your activity because of shortness of breath? No. Per the patient's wife, he has increased his activity recently.  5. Not been able to sleep because of shortness of breath? no  6. Had increased swelling in your feet or ankles? no  7. Had symptoms of dehydration (dizziness, dry mouth, increased thirst, decreased urine output) no  8. Had changes in sodium restriction? no  9. Been compliant with medication? Yes   ICM trend:   Follow-up plan: ICM clinic phone appointment: 11/10/14. Per the patient's wife, the patient has been doing well this month. His corvue readings were up some from ~ 11/11-11/18. She thinks he may have had some dietary indiscretion at that time, but she tries to keep a close eye on what he is consuming. Fluid trends are currently back to baseline. No changes made today.  Copy of note sent to patient's primary care physician, primary cardiologist, and device following physician.  Sherri Rad, RN, BSN 10/09/2014 5:05 PM

## 2014-10-09 NOTE — Telephone Encounter (Signed)
I called the home number and no answer.

## 2014-10-09 NOTE — Telephone Encounter (Signed)
I spoke with the pt's wife and she said the 09/11/14 office note and 09/05/14 lab results need to be faxed to [facility] for his disability claim.

## 2014-10-14 NOTE — Telephone Encounter (Addendum)
I have reviewed the pt's chart and I cannot locate a fax number for [facility].  I did find that records have been faxed to [facility] in the past.  I have left a message for the pt's wife to clarify where records need to be faxed.

## 2014-10-14 NOTE — Telephone Encounter (Signed)
I spoke with the pt's wife and she said the records need to be faxed to [facility].  I will fax records to [phone].

## 2014-10-16 ENCOUNTER — Encounter (HOSPITAL_COMMUNITY): Payer: Self-pay | Admitting: Cardiovascular Disease

## 2014-10-27 ENCOUNTER — Other Ambulatory Visit: Payer: Self-pay | Admitting: Cardiovascular Disease

## 2014-11-05 ENCOUNTER — Telehealth: Payer: Self-pay | Admitting: Cardiovascular Disease

## 2014-11-05 NOTE — Telephone Encounter (Signed)
New Msg      1. Are you calling in reference to your FMLA or disability form? Disability form    2. What is your question in regards to FMLA or disability form? It needs to be specified how much work pt can complete in 8hrs or how much he can't complete in that time frame   3. Do you need copies of your medical records? Yes   4. Are you waiting on a nurse to call you back with results or are you wanting copies of your results? No    Please fax info to [phone]  Pt wife Nettie Elm can be contacted if needed.

## 2014-11-05 NOTE — Telephone Encounter (Signed)
Spoke with pt wife she stated [employer] needs an Abilities Form completed By Dr.Cooper I let her know this form will have to be faxed over by [employer] we do not keep these forms here in the office wife understood and stated she will give them a call.

## 2014-11-10 ENCOUNTER — Ambulatory Visit (INDEPENDENT_AMBULATORY_CARE_PROVIDER_SITE_OTHER): Payer: 59 | Admitting: *Deleted

## 2014-11-10 DIAGNOSIS — Z95810 Presence of automatic (implantable) cardiac defibrillator: Secondary | ICD-10-CM

## 2014-11-10 DIAGNOSIS — I255 Ischemic cardiomyopathy: Secondary | ICD-10-CM

## 2014-11-11 NOTE — Progress Notes (Signed)
EPIC Encounter for ICM Monitoring  Patient Name: Gary [name] is a 64 y.o. male Date: 11/11/2014 Primary Care Physican: Samuel Jester, DO Primary Cardiologist: Excell Seltzer Electrophysiologist: Allred Dry Weight: 117 lbs       In the past month, have you:  1. Gained more than 2 pounds in a day or more than 5 pounds in a week? no  2. Had changes in your medications (with verification of current medications)? no  3. Had more shortness of breath than is usual for you? no  4. Limited your activity because of shortness of breath? no  5. Not been able to sleep because of shortness of breath? no  6. Had increased swelling in your feet or ankles? no  7. Had symptoms of dehydration (dizziness, dry mouth, increased thirst, decreased urine output) no  8. Had changes in sodium restriction? no  9. Been compliant with medication? Yes   ICM trend:   Follow-up plan: ICM clinic phone appointment: 12/15/14  Copy of note sent to patient's primary care physician, primary cardiologist, and device following physician.  Sherri Rad, RN, BSN 11/11/2014 3:09 PM

## 2014-11-17 NOTE — Telephone Encounter (Signed)
New Message  Pt wife called back to follow up on the insurance forms. Per medical records unable to release any information to the wife because she is not on HIPPA. Wife requests to speak with nurse, per wife nurse lauren is familiar with the situation. Please call

## 2014-11-17 NOTE — Telephone Encounter (Signed)
I spoke with the pt's wife and made her aware that Abilities Form is in Dr [employer] folder for completion.  She states that the company has given them an extension to get this paperwork completed and it must be received by [employer] by the end of the week. I will make Dr Excell Seltzer aware of this information.

## 2014-11-18 NOTE — Telephone Encounter (Signed)
Dr Excell Seltzer completed form and I placed this on Kim's desk in medical records. This form needs to be faxed ASAP and the pt's wife would also like a copy of paperwork mailed to her home.

## 2014-11-19 ENCOUNTER — Telehealth: Payer: Self-pay | Admitting: Cardiovascular Disease

## 2014-11-19 NOTE — Telephone Encounter (Signed)
[employer] form faxed Nelida Meuse at  [phone] in and mailed to pt home address [address]

## 2014-12-15 ENCOUNTER — Encounter: Payer: Self-pay | Admitting: *Deleted

## 2014-12-15 ENCOUNTER — Ambulatory Visit (INDEPENDENT_AMBULATORY_CARE_PROVIDER_SITE_OTHER): Payer: 59 | Admitting: *Deleted

## 2014-12-15 DIAGNOSIS — I255 Ischemic cardiomyopathy: Secondary | ICD-10-CM

## 2014-12-15 DIAGNOSIS — Z95810 Presence of automatic (implantable) cardiac defibrillator: Secondary | ICD-10-CM

## 2014-12-15 NOTE — Progress Notes (Signed)
EPIC Encounter for ICM Monitoring  Patient Name: Gary [name] is a 64 y.o. male Date: 12/15/2014 Primary Care Physican: Samuel Jester, DO Primary Cardiologist: Excell Seltzer Electrophysiologist: Allred Dry Weight: 118 lbs       In the past month, have you:  1. Gained more than 2 pounds in a day or more than 5 pounds in a week? no  2. Had changes in your medications (with verification of current medications)? no  3. Had more shortness of breath than is usual for you? no  4. Limited your activity because of shortness of breath? no  5. Not been able to sleep because of shortness of breath? no  6. Had increased swelling in your feet or ankles? no  7. Had symptoms of dehydration (dizziness, dry mouth, increased thirst, decreased urine output) no  8. Had changes in sodium restriction? Yes. He has eaten some baked beans lately.   9. Been compliant with medication? Yes. He is complaining of some muscle and joint aches. I have advised his wife that he may come off of Crestor for a week or two and see if he notices improvement in his symptoms. He only takes this about three times a week currently.    ICM trend:   Follow-up plan: ICM clinic phone appointment: 01/15/15. The patient has been feeling well overall. I have advised his wife we will make no changes today. The patient has become more aware of his sodium consumption in the foods he eats. He has cut out all sodas.   Copy of note sent to patient's primary care physician, primary cardiologist, and device following physician.  Sherri Rad, RN, BSN 12/15/2014 11:20 AM

## 2014-12-16 ENCOUNTER — Encounter: Payer: Self-pay | Admitting: Cardiology

## 2014-12-22 ENCOUNTER — Telehealth: Payer: Self-pay | Admitting: Cardiovascular Disease

## 2014-12-22 NOTE — Telephone Encounter (Signed)
Informed pt to disregard letter. Transmission was received.

## 2014-12-22 NOTE — Telephone Encounter (Signed)
New Msg         Pt wife calling, would like to know if transmission was received for device.    Wife received a letter, stating that transmission wasn't received for device.   Please return call.

## 2015-01-15 ENCOUNTER — Telehealth: Payer: Self-pay | Admitting: *Deleted

## 2015-01-15 ENCOUNTER — Encounter: Payer: Self-pay | Admitting: *Deleted

## 2015-01-15 ENCOUNTER — Ambulatory Visit (INDEPENDENT_AMBULATORY_CARE_PROVIDER_SITE_OTHER): Payer: 59 | Admitting: *Deleted

## 2015-01-15 DIAGNOSIS — I255 Ischemic cardiomyopathy: Secondary | ICD-10-CM

## 2015-01-15 DIAGNOSIS — Z95810 Presence of automatic (implantable) cardiac defibrillator: Secondary | ICD-10-CM

## 2015-01-15 NOTE — Progress Notes (Signed)
EPIC Encounter for ICM Monitoring  Patient Name: Gary [name] is a 64 y.o. male Date: 01/15/2015 Primary Care Physican: Samuel Jester, DO Primary Cardiologist: Excell Seltzer Electrophysiologist: Allred Dry Weight: 118 lbs       In the past month, have you:  1. Gained more than 2 pounds in a day or more than 5 pounds in a week? no  2. Had changes in your medications (with verification of current medications)? Yes. He is holding Crestor due to muscle aches. He has been off this for about 3 weeks and has noticed some improvement in his symptoms. I advised I would forward to Dr. Excell Seltzer to review.  3. Had more shortness of breath than is usual for you? no  4. Limited your activity because of shortness of breath? no  5. Not been able to sleep because of shortness of breath? no  6. Had increased swelling in your feet or ankles? no  7. Had symptoms of dehydration (dizziness, dry mouth, increased thirst, decreased urine output) no  8. Had changes in sodium restriction? no  9. Been compliant with medication? Yes   ICM trend:   Follow-up plan: ICM clinic phone appointment: 02/16/15. The patient is doing well today. No changes made.  Copy of note sent to patient's primary care physician, primary cardiologist, and device following physician.  Sherri Rad, RN, BSN 01/15/2015 1:34 PM

## 2015-01-15 NOTE — Telephone Encounter (Signed)
ICM transmission received. I left a message for the patient to call. 

## 2015-01-19 NOTE — Telephone Encounter (Signed)
Late entry- I spoke with the patient on 3/10.

## 2015-02-16 ENCOUNTER — Ambulatory Visit (INDEPENDENT_AMBULATORY_CARE_PROVIDER_SITE_OTHER): Payer: 59 | Admitting: *Deleted

## 2015-02-16 DIAGNOSIS — Z95810 Presence of automatic (implantable) cardiac defibrillator: Secondary | ICD-10-CM | POA: Diagnosis not present

## 2015-02-16 DIAGNOSIS — I255 Ischemic cardiomyopathy: Secondary | ICD-10-CM

## 2015-02-17 ENCOUNTER — Encounter: Payer: Self-pay | Admitting: *Deleted

## 2015-02-17 NOTE — Progress Notes (Signed)
EPIC Encounter for ICM Monitoring  Patient Name: Gary [name] is a 64 y.o. male Date: 02/17/2015 Primary Care Physican: Samuel Jester, DO Primary Cardiologist: Excell Seltzer Electrophysiologist: Allred Dry Weight: 180 lbs       In the past month, have you:  1. Gained more than 2 pounds in a day or more than 5 pounds in a week? No. Per the patient's wife's report, his weight may have gone up 1 lb over the last month.  2. Had changes in your medications (with verification of current medications)? No. He is still off of crestor due to side effects.   3. Had more shortness of breath than is usual for you? no  4. Limited your activity because of shortness of breath? no  5. Not been able to sleep because of shortness of breath? no  6. Had increased swelling in your feet or ankles? no  7. Had symptoms of dehydration (dizziness, dry mouth, increased thirst, decreased urine output) no  8. Had changes in sodium restriction? no  9. Been compliant with medication? Yes   ICM trend:   Follow-up plan: ICM clinic phone appointment: 03/23/15  Copy of note sent to patient's primary care physician, primary cardiologist, and device following physician.  Sherri Rad, RN, BSN 02/17/2015 1:07 PM

## 2015-03-11 ENCOUNTER — Ambulatory Visit (INDEPENDENT_AMBULATORY_CARE_PROVIDER_SITE_OTHER): Payer: 59 | Admitting: Cardiovascular Disease

## 2015-03-11 ENCOUNTER — Encounter: Payer: Self-pay | Admitting: Cardiovascular Disease

## 2015-03-11 VITALS — BP 168/90 | HR 54 | Ht 66.0 in | Wt 128.0 lb

## 2015-03-11 DIAGNOSIS — E7800 Pure hypercholesterolemia, unspecified: Secondary | ICD-10-CM

## 2015-03-11 DIAGNOSIS — I255 Ischemic cardiomyopathy: Secondary | ICD-10-CM

## 2015-03-11 DIAGNOSIS — I10 Essential (primary) hypertension: Secondary | ICD-10-CM
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
## 2015-03-11 NOTE — Patient Instructions (Addendum)
Medication Instructions:  Your physician recommends that you continue on your current medications as directed. Please refer to the Current Medication list given to you today.  Labwork: No new orders. Labs are followed by PCP.   Testing/Procedures: No new orders.  Follow-Up: Your physician wants you to follow-up in: 6 MONTHS with PA/NP. You will receive a reminder letter in the mail two months in advance. If you don't receive a letter, please call our office to schedule the follow-up appointment.  Your physician wants you to follow-up in: 1 YEAR with Dr Excell Seltzer. You will receive a reminder letter in the mail two months in advance. If you don't receive a letter, please call our office to schedule the follow-up appointment.   Any Other Special Instructions Will Be Listed Below (If Applicable).

## 2015-03-23 ENCOUNTER — Ambulatory Visit (INDEPENDENT_AMBULATORY_CARE_PROVIDER_SITE_OTHER): Payer: 59 | Admitting: *Deleted

## 2015-03-23 ENCOUNTER — Encounter: Payer: Self-pay | Admitting: *Deleted

## 2015-03-23 ENCOUNTER — Telehealth: Payer: Self-pay | Admitting: Internal Medicine

## 2015-03-23 DIAGNOSIS — I255 Ischemic cardiomyopathy: Secondary | ICD-10-CM

## 2015-03-23 DIAGNOSIS — Z95810 Presence of automatic (implantable) cardiac defibrillator: Secondary | ICD-10-CM

## 2015-03-23 NOTE — Telephone Encounter (Signed)
Informed pt wife that transmission was received.  

## 2015-03-23 NOTE — Telephone Encounter (Signed)
New message      Calling to see if you got his remote transmission?  Please call

## 2015-03-23 NOTE — Progress Notes (Signed)
EPIC Encounter for ICM Monitoring  Patient Name: Gary [name] is a 64 y.o. male Date: 03/23/2015 Primary Care Physican: Samuel Jester, DO Primary Cardiologist: Excell Seltzer Electrophysiologist: Allred  Dry Weight: 180 lbs       In the past month, have you:  1. Gained more than 2 pounds in a day or more than 5 pounds in a week? no  2. Had changes in your medications (with verification of current medications)? no  3. Had more shortness of breath than is usual for you? no  4. Limited your activity because of shortness of breath? No. He does have some seasonal allergies he is dealing with.  5. Not been able to sleep because of shortness of breath? no  6. Had increased swelling in your feet or ankles? no  7. Had symptoms of dehydration (dizziness, dry mouth, increased thirst, decreased urine output) no  8. Had changes in sodium restriction? no  9. Been compliant with medication? Yes   ICM trend:   Follow-up plan: ICM clinic phone appointment: 04/23/2015. No changes made today.  Copy of note sent to patient's primary care physician, primary cardiologist, and device following physician.  Sherri Rad, RN, BSN 03/23/2015 2:27 PM

## 2015-04-23 ENCOUNTER — Ambulatory Visit (INDEPENDENT_AMBULATORY_CARE_PROVIDER_SITE_OTHER): Payer: 59 | Admitting: *Deleted

## 2015-04-23 ENCOUNTER — Encounter: Payer: Self-pay | Admitting: *Deleted

## 2015-04-23 DIAGNOSIS — I255 Ischemic cardiomyopathy: Secondary | ICD-10-CM | POA: Diagnosis not present

## 2015-04-23 DIAGNOSIS — Z95810 Presence of automatic (implantable) cardiac defibrillator: Secondary | ICD-10-CM

## 2015-04-23 NOTE — Progress Notes (Signed)
EPIC Encounter for ICM Monitoring  Patient Name: Gary [name] is a 64 y.o. male Date: 04/23/2015 Primary Care Physican: Samuel Jester, DO Primary Cardiologist: Excell Seltzer Electrophysiologist: Allred Dry Weight: 130 lbs (in error last month this was documented at 180 lbs, but that was incorrect)       In the past month, have you:  1. Gained more than 2 pounds in a day or more than 5 pounds in a week? no  2. Had changes in your medications (with verification of current medications)? no  3. Had more shortness of breath than is usual for you? no  4. Limited your activity because of shortness of breath? no  5. Not been able to sleep because of shortness of breath? no  6. Had increased swelling in your feet or ankles? no  7. Had symptoms of dehydration (dizziness, dry mouth, increased thirst, decreased urine output) no  8. Had changes in sodium restriction? no  9. Been compliant with medication? Yes   ICM trend:   Follow-up plan: ICM clinic phone appointment: 07/02/15. The patient has follow up scheduled with Dr. Johney Frame on 06/01/15. No changes made today.   Copy of note sent to patient's primary care physician, primary cardiologist, and device following physician.  Sherri Rad, RN, BSN 04/23/2015 2:51 PM

## 2015-04-25 ENCOUNTER — Other Ambulatory Visit: Payer: Self-pay | Admitting: Cardiovascular Disease

## 2015-06-01 ENCOUNTER — Ambulatory Visit (INDEPENDENT_AMBULATORY_CARE_PROVIDER_SITE_OTHER): Payer: 59 | Admitting: Internal Medicine

## 2015-06-01 ENCOUNTER — Encounter: Payer: Self-pay | Admitting: Internal Medicine

## 2015-06-01 VITALS — BP 150/78 | HR 58 | Ht 66.0 in | Wt 130.2 lb

## 2015-06-01 DIAGNOSIS — I472 Ventricular tachycardia, unspecified: Secondary | ICD-10-CM

## 2015-06-01 DIAGNOSIS — I255 Ischemic cardiomyopathy: Secondary | ICD-10-CM | POA: Diagnosis not present

## 2015-06-01 DIAGNOSIS — I10 Essential (primary) hypertension: Secondary | ICD-10-CM | POA: Diagnosis not present

## 2015-06-01 LAB — CUP PACEART INCLINIC DEVICE CHECK
Battery Remaining Longevity: 88.8 mo
Brady Statistic RV Percent Paced: 0 %
HIGH POWER IMPEDANCE MEASURED VALUE: 70.875
Lead Channel Impedance Value: 400 Ohm
Lead Channel Pacing Threshold Amplitude: 0.75 V
Lead Channel Pacing Threshold Pulse Width: 0.5 ms
Lead Channel Sensing Intrinsic Amplitude: 3.5 mV
Lead Channel Sensing Intrinsic Amplitude: 7.4 mV
Lead Channel Setting Pacing Amplitude: 2 V
Lead Channel Setting Pacing Amplitude: 2.5 V
Lead Channel Setting Sensing Sensitivity: 0.5 mV
MDC IDC MSMT LEADCHNL RA PACING THRESHOLD AMPLITUDE: 0.75 V
MDC IDC MSMT LEADCHNL RV IMPEDANCE VALUE: 437.5 Ohm
MDC IDC MSMT LEADCHNL RV PACING THRESHOLD PULSEWIDTH: 0.5 ms
MDC IDC PG SERIAL: 7142670
MDC IDC SESS DTM: 20160725112729
MDC IDC SET LEADCHNL RV PACING PULSEWIDTH: 0.5 ms
MDC IDC SET ZONE DETECTION INTERVAL: 330 ms
MDC IDC STAT BRADY RA PERCENT PACED: 3.8 %
Pulse Gen Model: 2411
Zone Setting Detection Interval: 250 ms
Zone Setting Detection Interval: 300 ms

## 2015-06-01 NOTE — Patient Instructions (Signed)
Medication Instructions:  Your physician recommends that you continue on your current medications as directed. Please refer to the Current Medication list given to you today.   Labwork: none ordered  Testing/Procedures: None ordered  Follow-Up: Your physician wants you to follow-up in: 12 months with Gary Balsam, NP You will receive a reminder letter in the mail two months in advance. If you don't receive a letter, please call our office to schedule the follow-up appointment.   Remote monitoring is used to monitor your Pacemaker or ICD from home. This monitoring reduces the number of office visits required to check your device to one time per year. It allows [place] to keep an eye on the functioning of your device to ensure it is working properly. You are scheduled for a device check from home on 08/31/15. You may send your transmission at any time that day. If you have a wireless device, the transmission will be sent automatically. After your physician reviews your transmission, you will receive a postcard with your next transmission date. ]  Any Other Special Instructions Will Be Listed Below (If Applicable).
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
## 2015-07-02 ENCOUNTER — Ambulatory Visit (INDEPENDENT_AMBULATORY_CARE_PROVIDER_SITE_OTHER): Payer: 59

## 2015-07-02 DIAGNOSIS — Z95810 Presence of automatic (implantable) cardiac defibrillator: Secondary | ICD-10-CM

## 2015-07-02 DIAGNOSIS — I255 Ischemic cardiomyopathy: Secondary | ICD-10-CM

## 2015-07-03 ENCOUNTER — Telehealth: Payer: Self-pay

## 2015-07-03 NOTE — Telephone Encounter (Signed)
ICM transmission received.  Wife Nettie Elm answered phone and stated he was not home.  She inquired about the call and stated she could speak with me and a form has been completed.  Unable to locate DPR and will call back to speak with patient to obtain verbal permission to speak with wife.

## 2015-07-06 NOTE — Telephone Encounter (Signed)
Spoke with patient.

## 2015-07-06 NOTE — Progress Notes (Signed)
EPIC Encounter for ICM Monitoring  Patient Name: Gary [name] is a 64 y.o. male Date: 07/06/2015 Primary Care Physican: Samuel Jester, DO Primary Cardiologist: Excell Seltzer Electrophysiologist: Allred Dry Weight: 125.8 lbs       In the past month, have you:  1. Gained more than 2 pounds in a day or more than 5 pounds in a week? no  2. Had changes in your medications (with verification of current medications)? no  3. Had more shortness of breath than is usual for you? no  4. Limited your activity because of shortness of breath? no  5. Not been able to sleep because of shortness of breath? no  6. Had increased swelling in your feet or ankles? no  7. Had symptoms of dehydration (dizziness, dry mouth, increased thirst, decreased urine output) no  8. Had changes in sodium restriction? no  9. Been compliant with medication? Yes   ICM trend:   Follow-up plan: ICM clinic phone appointment on 08/06/2015.  CorVue daily impedance revealed above the baseline and patient denied any dehydration symptoms.  Dehydration symptoms reviewed and encouraged to drink fluids during hot and humid weather.  He reported he works outside a lot.  He stated he is feeling fine. He gave verbal permission to speak with wife, Nettie Elm regarding his health.     Copy of note sent to patient's primary care physician, primary cardiologist, and device following physician.  Karie Soda, RN, CCM 07/06/2015 11:10 AM

## 2015-08-06 ENCOUNTER — Ambulatory Visit (INDEPENDENT_AMBULATORY_CARE_PROVIDER_SITE_OTHER): Payer: 59

## 2015-08-06 DIAGNOSIS — I255 Ischemic cardiomyopathy: Secondary | ICD-10-CM

## 2015-08-06 DIAGNOSIS — Z95810 Presence of automatic (implantable) cardiac defibrillator: Secondary | ICD-10-CM | POA: Diagnosis not present

## 2015-08-07 NOTE — Progress Notes (Signed)
EPIC Encounter for ICM Monitoring  Patient Name: Gary [name] is a 64 y.o. male Date: 08/07/2015 Primary Care Physican: Samuel Jester, DO Primary Cardiologist: Excell Seltzer Electrophysiologist: Allred Dry Weight: 124.2 lb       In the past month, have you:  1. Gained more than 2 pounds in a day or more than 5 pounds in a week? no  2. Had changes in your medications (with verification of current medications)? no  3. Had more shortness of breath than is usual for you? no  4. Limited your activity because of shortness of breath? no  5. Not been able to sleep because of shortness of breath? no  6. Had increased swelling in your feet or ankles? no  7. Had symptoms of dehydration (dizziness, dry mouth, increased thirst, decreased urine output) no  8. Had changes in sodium restriction? no  9. Been compliant with medication? Yes   ICM trend:   Follow-up plan: ICM clinic phone appointment on 09/08/2015.  Spoke with wife.  Impedance below baseline 08/01/2015 to 08/07/2015. Patient has been going with wife to hospitals/physicians appointments and is not drinking a lot of fluids. Wife diagnosed this week with terminal cancer.  Expressed sympathy and advised to remind patient to drink fluids to decrease risk of dehydration.  Patient is having difficulty sleeping due to wife's diagnosis and has contacted PCP for sleep med.  No changes today.   Copy of note sent to patient's primary care physician, primary cardiologist, and device following physician.  Karie Soda, RN, CCM 08/07/2015 1:55 PM

## 2015-08-20 ENCOUNTER — Telehealth: Payer: Self-pay | Admitting: Cardiovascular Disease

## 2015-08-20 NOTE — Telephone Encounter (Signed)
(  Pt's daughter--Pamela Anastasi [phone] mobile)  I spoke with the pt's wife and she has Stage 4 cancer and Hospice has been called in for her.  She said the pt's daughter [name] is now involved in caring for Mr Kapler.  Mrs Malagon was calling to check on the status of the pt's disability paperwork.  I made her aware that Dr Excell Seltzer does have this paperwork and he will get this completed for the pt.

## 2015-08-20 NOTE — Telephone Encounter (Signed)
Ne wMessage  Pt dtr requesting to speak w/ Lauren concerning documents that Dr Excell Seltzer was to sign. Please call back and discuss.

## 2015-08-24 ENCOUNTER — Telehealth: Payer: Self-pay | Admitting: Cardiovascular Disease

## 2015-08-24 NOTE — Telephone Encounter (Signed)
Dr Excell Seltzer completed form and this was turned into medical records.

## 2015-08-24 NOTE — Telephone Encounter (Signed)
Patient aware [employer] Form completed. Pt stated he will come pick up

## 2015-09-08 ENCOUNTER — Ambulatory Visit (INDEPENDENT_AMBULATORY_CARE_PROVIDER_SITE_OTHER): Payer: 59 | Admitting: *Deleted

## 2015-09-08 DIAGNOSIS — Z95810 Presence of automatic (implantable) cardiac defibrillator: Secondary | ICD-10-CM

## 2015-09-08 DIAGNOSIS — I255 Ischemic cardiomyopathy: Secondary | ICD-10-CM
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
## 2015-09-09 ENCOUNTER — Ambulatory Visit (INDEPENDENT_AMBULATORY_CARE_PROVIDER_SITE_OTHER): Payer: 59 | Admitting: Physician Assistant

## 2015-09-09 ENCOUNTER — Encounter: Payer: Self-pay | Admitting: Physician Assistant

## 2015-09-09 ENCOUNTER — Telehealth: Payer: Self-pay

## 2015-09-09 VITALS — BP 148/76 | HR 56 | Ht 66.0 in | Wt 129.0 lb

## 2015-09-09 DIAGNOSIS — I420 Dilated cardiomyopathy: Secondary | ICD-10-CM | POA: Diagnosis not present

## 2015-09-09 DIAGNOSIS — I10 Essential (primary) hypertension: Secondary | ICD-10-CM

## 2015-09-09 DIAGNOSIS — I2510 Atherosclerotic heart disease of native coronary artery without angina pectoris: Secondary | ICD-10-CM | POA: Diagnosis not present

## 2015-09-09 DIAGNOSIS — Z95810 Presence of automatic (implantable) cardiac defibrillator: Secondary | ICD-10-CM

## 2015-09-09 DIAGNOSIS — E785 Hyperlipidemia, unspecified: Secondary | ICD-10-CM

## 2015-09-09 MED ORDER — PRAVASTATIN SODIUM 10 MG PO TABS: 10.0000 mg | ORAL_TABLET | ORAL | Status: DC

## 2015-09-09 NOTE — Telephone Encounter (Signed)
ICM transmission received.  Attempted call to patient and daughter stated he was outside.  Will call back.

## 2015-09-09 NOTE — Patient Instructions (Signed)
Medication Instructions:  1. START PRAVASTATIN 10 MG ON MON, WED AND FRI'S ONLY; RX SENT  Labwork: 8 WEEKS WITH PRIMARY CARE; BMET, LIPID AND LIVER PANEL; WITH THE RESULTS TO BE FAXED TO [facility], [place] [phone]  Testing/Procedures: NONE  Follow-Up: DR. Excell Seltzer 6 MONTHS; WE WILL CALL YOU A COUPLE OF MONTHS EARLIER TO MAKE APPT  Any Other Special Instructions Will Be Listed Below (If Applicable).   If you need a refill on your cardiac medications before your next appointment, please call your pharmacy.

## 2015-09-10 NOTE — Progress Notes (Signed)
EPIC Encounter for ICM Monitoring  Patient Name: Gary [name] is a 64 y.o. male Date: 09/10/2015 Primary Care Physican: Samuel Jester, DO Primary Cardiologist: Excell Seltzer Electrophysiologist: Allred Dry Weight: 123.2 lb       In the past month, have you:  1. Gained more than 2 pounds in a day or more than 5 pounds in a week? no  2. Had changes in your medications (with verification of current medications)? no  3. Had more shortness of breath than is usual for you? no  4. Limited your activity because of shortness of breath? no  5. Not been able to sleep because of shortness of breath? no  6. Had increased swelling in your feet or ankles? no  7. Had symptoms of dehydration (dizziness, dry mouth, increased thirst, decreased urine output) no  8. Had changes in sodium restriction? no  9. Been compliant with medication? Yes   ICM trend: 09/08/2015   Follow-up plan: ICM clinic phone appointment on 10/12/2015.  Corvue impedance trending along baseline.  Patient emotional and reported his wife is in hospice due to cancer.  His 2 daughters are helping him and he gave permission to speak with both of them about his health, Pam Marandola and Milas Gain.  He reported he completed a form in the office on 09/08/2015 giving permission to speak with both of them.  He has been trying to follow a low sodium diet but his wife used to do all the cooking and monitoring sodium in foods.  He stated it is harder for him and suggested his daughters may be able to help him with monitoring sodium in foods.  He stated he is doing ok but difficult with his wife being sick and she will not get better. No changes today.   Copy of note sent to patient's primary care physician, primary cardiologist, and device following physician.  Karie Soda, RN, CCM 09/10/2015 10:08 AM

## 2015-09-15 NOTE — Telephone Encounter (Signed)
Spoke with patient.

## 2015-09-24 ENCOUNTER — Other Ambulatory Visit: Payer: Self-pay | Admitting: *Deleted

## 2015-09-24 MED ORDER — CARVEDILOL 25 MG PO TABS: 25.0000 mg | ORAL_TABLET | Freq: Two times a day (BID) | ORAL | Status: DC

## 2015-09-24 NOTE — Telephone Encounter (Signed)
Pt was in the office today with his daughter who had an appt. Pt said he needed a refill for his coreg. Pt recently saw Bing Neighbors. PA 11/2; refill sent in

## 2015-09-25 ENCOUNTER — Encounter: Payer: Self-pay | Admitting: Cardiology

## 2015-09-25 LAB — CUP PACEART REMOTE DEVICE CHECK
Battery Remaining Percentage: 81 %
Brady Statistic RA Percent Paced: 5.6 %
Date Time Interrogation Session: 20161118092926
HighPow Impedance: 81 Ohm
Implantable Lead Location: 753860
Lead Channel Impedance Value: 410 Ohm
Lead Channel Setting Pacing Amplitude: 2 V
Lead Channel Setting Pacing Amplitude: 2.5 V
MDC IDC LEAD IMPLANT DT: 20150313
MDC IDC LEAD IMPLANT DT: 20150313
MDC IDC LEAD LOCATION: 753859
MDC IDC MSMT BATTERY REMAINING LONGEVITY: 78 mo
MDC IDC MSMT LEADCHNL RA SENSING INTR AMPL: 3.1 mV
MDC IDC MSMT LEADCHNL RV IMPEDANCE VALUE: 510 Ohm
MDC IDC MSMT LEADCHNL RV SENSING INTR AMPL: 7.1 mV
MDC IDC SET LEADCHNL RV PACING PULSEWIDTH: 0.5 ms
MDC IDC SET LEADCHNL RV SENSING SENSITIVITY: 0.5 mV
MDC IDC STAT BRADY RV PERCENT PACED: 1 % — AB
Pulse Gen Serial Number: 7142670

## 2015-10-12 ENCOUNTER — Telehealth: Payer: Self-pay

## 2015-10-12 NOTE — Telephone Encounter (Signed)
ICM transmission received.  Attempted call and left message for return call.  

## 2015-10-14 NOTE — Telephone Encounter (Signed)
Attempted ICM patient call and no answer.  Unable to reach for follow up on ICM transmission from 10/13/2015.  Impedance above baseline 10/06/2015 to 10/11/2015 suggesting dryness and returned to baseline on 10/12/2015.  Patient letter sent with new transmission date of 11/16/2015.

## 2015-10-16 ENCOUNTER — Telehealth: Payer: Self-pay | Admitting: Internal Medicine

## 2015-10-16 NOTE — Telephone Encounter (Signed)
Patients daughter [name] returned call.  She stated to call her at [phone] and leave a detailed message regarding what is needed with her father.  [name] is a Engineer, civil (consulting).

## 2015-10-16 NOTE — Telephone Encounter (Signed)
New Message     Pt's daughter calling Jacki Cones back and asking for Jacki Cones to call pt's home phone back and leave a detailed message so that she can inform the pt. Please call back and advise.

## 2015-10-19 ENCOUNTER — Telehealth: Payer: Self-pay

## 2015-10-19 NOTE — Telephone Encounter (Signed)
Received call from patients daughter, Elonzo Villar.  Explained reason for monthly calls.  She stated her mother passed away 3 weeks ago.  She stated she will be the contact person for her father. She is a nurses as well.  She stated her mother helped care for patient and made sure he followed low sodium diet.  Advised the transmission from last week showed 10/06/2015 to 10/11/2015 suggesting dryness.   Impedance showed trending downward at time of transmission and advised for patient to limit sodium intake to 2000 mg.  She denied he has any HF symptoms at this time.  Explained next transmission date is 11/16/2015.  Provided my number and encouraged her to call if patient is having any HF symptoms.

## 2015-10-19 NOTE — Telephone Encounter (Signed)
Attempted call to patients daughter, Juanye Ziobro and left message for reason for call and new transmission date.

## 2015-11-16 ENCOUNTER — Ambulatory Visit (INDEPENDENT_AMBULATORY_CARE_PROVIDER_SITE_OTHER): Payer: BLUE CROSS/BLUE SHIELD

## 2015-11-16 DIAGNOSIS — Z95810 Presence of automatic (implantable) cardiac defibrillator: Secondary | ICD-10-CM | POA: Diagnosis not present

## 2015-11-16 DIAGNOSIS — I5022 Chronic systolic (congestive) heart failure: Secondary | ICD-10-CM

## 2015-11-18 NOTE — Progress Notes (Signed)
EPIC Encounter for ICM Monitoring  Patient Name: Gary [name] is a 65 y.o. male Date: 11/18/2015 Primary Care Physican: Samuel Jester, DO Primary Cardiologist: Excell Seltzer Electrophysiologist: Allred Dry Weight: 120 lbs       In the past month, have you:  1. Gained more than 2 pounds in a day or more than 5 pounds in a week? no  2. Had changes in your medications (with verification of current medications)? no  3. Had more shortness of breath than is usual for you? no  4. Limited your activity because of shortness of breath? no  5. Not been able to sleep because of shortness of breath? no  6. Had increased swelling in your feet or ankles? no  7. Had symptoms of dehydration (dizziness, dry mouth, increased thirst, decreased urine output) no  8. Had changes in sodium restriction? no  9. Been compliant with medication? Yes   ICM trend: 3 month view 11/17/2015  ICM trend: 1 year view 11/17/2015   Follow-up plan: ICM clinic phone appointment on 12/22/2015.  Corvue daily impedance below baseline 11/06/2016 to 11/12/2015 and then trended back to baseline.  He reported no HF symptoms but did have a cold/sinus infection with chest congestion.  He stated he was on antibiotics and is feeling better.  He stated that was probably the reason why he he had some fluid.  Discussed low sodium diet and he eats TV dinners since his wife died a few months ago.  Advised to try and keep the entree dinners to around 600 mg salt so that he can limit his daily intake to 2000 mg daily.  He stated he is doing fine at this time.  He is drinking boost to keep from losing more weight. No changes today.   Copy of note sent to patient's primary care physician, primary cardiologist, and device following physician.  Karie Soda, RN, CCM 11/18/2015 2:34 PM

## 2015-12-08 ENCOUNTER — Ambulatory Visit: Payer: 59

## 2015-12-22 ENCOUNTER — Ambulatory Visit (INDEPENDENT_AMBULATORY_CARE_PROVIDER_SITE_OTHER): Payer: BLUE CROSS/BLUE SHIELD | Admitting: *Deleted

## 2015-12-22 DIAGNOSIS — I5022 Chronic systolic (congestive) heart failure: Secondary | ICD-10-CM

## 2015-12-22 DIAGNOSIS — Z95810 Presence of automatic (implantable) cardiac defibrillator: Secondary | ICD-10-CM | POA: Diagnosis not present

## 2015-12-22 DIAGNOSIS — I420 Dilated cardiomyopathy: Secondary | ICD-10-CM | POA: Diagnosis not present

## 2015-12-22 LAB — CUP PACEART REMOTE DEVICE CHECK
Battery Remaining Longevity: 79 mo
Battery Remaining Percentage: 79 %
Battery Voltage: 2.98 V
Brady Statistic AP VP Percent: 1 %
Brady Statistic RA Percent Paced: 3.2 %
Brady Statistic RV Percent Paced: 1 %
HIGH POWER IMPEDANCE MEASURED VALUE: 63 Ohm
HighPow Impedance: 63 Ohm
Implantable Lead Implant Date: 20150313
Implantable Lead Location: 753859
Lead Channel Impedance Value: 480 Ohm
Lead Channel Sensing Intrinsic Amplitude: 3.5 mV
Lead Channel Sensing Intrinsic Amplitude: 8 mV
Lead Channel Setting Pacing Pulse Width: 0.5 ms
Lead Channel Setting Sensing Sensitivity: 0.5 mV
MDC IDC LEAD IMPLANT DT: 20150313
MDC IDC LEAD LOCATION: 753860
MDC IDC MSMT LEADCHNL RA IMPEDANCE VALUE: 360 Ohm
MDC IDC PG SERIAL: 7142670
MDC IDC SESS DTM: 20170214070015
MDC IDC SET LEADCHNL RA PACING AMPLITUDE: 2 V
MDC IDC SET LEADCHNL RV PACING AMPLITUDE: 2.5 V
MDC IDC STAT BRADY AP VS PERCENT: 3.2 %
MDC IDC STAT BRADY AS VP PERCENT: 1 %
MDC IDC STAT BRADY AS VS PERCENT: 97 %

## 2015-12-22 NOTE — Progress Notes (Signed)
Remote ICD transmission.   

## 2015-12-23 ENCOUNTER — Telehealth: Payer: Self-pay

## 2015-12-23 ENCOUNTER — Encounter: Payer: Self-pay | Admitting: Cardiology

## 2015-12-23 NOTE — Telephone Encounter (Signed)
Remote ICM transmission received.  Attempted patient call and left message for return call.   

## 2015-12-24 NOTE — Progress Notes (Signed)
EPIC Encounter for ICM Monitoring  Patient Name: Gary [name] is a 65 y.o. male Date: 12/24/2015 Primary Care Physican: Samuel Jester, DO Primary Cardiologist: Excell Seltzer Electrophysiologist: Allred Dry Weight: 125 lbs       In the past month, have you:  1. Gained more than 2 pounds in a day or more than 5 pounds in a week? no  2. Had changes in your medications (with verification of current medications)? no  3. Had more shortness of breath than is usual for you? no  4. Limited your activity because of shortness of breath? no  5. Not been able to sleep because of shortness of breath? no  6. Had increased swelling in your feet or ankles? no  7. Had symptoms of dehydration (dizziness, dry mouth, increased thirst, decreased urine output) no  8. Had changes in sodium restriction? no  9. Been compliant with medication? Yes   ICM trend: 3 month view for 12/22/2015   ICM trend: 1 year view for 12/22/2015   Follow-up plan: ICM clinic phone appointment 01/25/2016.  CorVue thoracic impedance trending along baseline and fluid levels appear stable.  He denied any fluid symptoms and stated he is doing better with dealing with his wife's death 3 months ago.  He is still learning more about managing a low salt diet (his wife used to do all the cooking for him).   His appetite is improving.  Education on fluid symptoms and when to report.  No changes today.   Copy of note sent to patient's primary care physician, primary cardiologist, and device following physician.  Karie Soda, RN, CCM 12/24/2015 9:53 AM

## 2015-12-24 NOTE — Telephone Encounter (Signed)
Spoke with patient.

## 2016-01-25 ENCOUNTER — Ambulatory Visit (INDEPENDENT_AMBULATORY_CARE_PROVIDER_SITE_OTHER): Payer: BLUE CROSS/BLUE SHIELD

## 2016-01-25 DIAGNOSIS — I5022 Chronic systolic (congestive) heart failure: Secondary | ICD-10-CM

## 2016-01-25 DIAGNOSIS — Z95810 Presence of automatic (implantable) cardiac defibrillator: Secondary | ICD-10-CM | POA: Diagnosis not present

## 2016-01-25 NOTE — Progress Notes (Signed)
EPIC Encounter for ICM Monitoring  Patient Name: Gary [name] is a 65 y.o. male Date: 01/25/2016 Primary Care Physican: Samuel Jester, DO Primary Cardiologist: Excell Seltzer Electrophysiologist: Allred Dry Weigh: 121.4 lbs   In the past month, have you:  1. Gained more than 2 pounds in a day or more than 5 pounds in a week? no  2. Had changes in your medications (with verification of current medications)? no  3. Had more shortness of breath than is usual for you? no  4. Limited your activity because of shortness of breath? no  5. Not been able to sleep because of shortness of breath? no  6. Had increased swelling in your feet or ankles? no  7. Had symptoms of dehydration (dizziness, dry mouth, increased thirst, decreased urine output) no  8. Had changes in sodium restriction? no  9. Been compliant with medication? Yes   ICM trend: 3 month view for 01/25/2016   ICM trend: 1 year view for 01/25/2016   Follow-up plan: ICM clinic phone appointment on 02/25/2016.  Thoracic impedance trending along reference line.  Patient denied any fluid symptoms.  Education given to limit sodium intake to < 2000 mg and fluid intake to 64 oz daily.  Encouraged to call for any fluid symptoms.  No changes today.     Karie Soda, RN, CCM 01/25/2016 3:36 PM

## 2016-02-25 ENCOUNTER — Ambulatory Visit (INDEPENDENT_AMBULATORY_CARE_PROVIDER_SITE_OTHER): Payer: BLUE CROSS/BLUE SHIELD

## 2016-02-25 DIAGNOSIS — I5022 Chronic systolic (congestive) heart failure: Secondary | ICD-10-CM

## 2016-02-25 DIAGNOSIS — Z95810 Presence of automatic (implantable) cardiac defibrillator: Secondary | ICD-10-CM | POA: Diagnosis not present

## 2016-02-25 NOTE — Progress Notes (Signed)
Received call back from patient.  He stated he is doing well.  Denied any fluid symptoms.  Encouraged him to call for any fluid symptoms.  He remains active and daughter checks on him regularly.  Weight: 121 lbs.  Next transmission 03/28/2016.

## 2016-02-25 NOTE — Progress Notes (Signed)
EPIC Encounter for ICM Monitoring  Patient Name: Gary [name] is a 65 y.o. male Date: 02/25/2016 Primary Care Physican: Samuel Jester, DO Primary Cardiologist: Excell Seltzer Electrophysiologist: Allred Dry Weight: unknown   In the past month, have you:  1. Gained more than 2 pounds in a day or more than 5 pounds in a week? N/A  2. Had changes in your medications (with verification of current medications)? N/A  3. Had more shortness of breath than is usual for you? N/A  4. Limited your activity because of shortness of breath? N/A  5. Not been able to sleep because of shortness of breath? N/A  6. Had increased swelling in your feet or ankles? N/A  7. Had symptoms of dehydration (dizziness, dry mouth, increased thirst, decreased urine output) N/A  8. Had changes in sodium restriction? N/A  9. Been compliant with medication? N/A   ICM trend: 3 month view for 02/25/2016   ICM trend: 1 year view for 02/25/2016   Follow-up plan: ICM clinic phone appointment on 03/28/2016.  Attempted call to patient and unable to reach.  Transmission reviewed.  Thoracic impedance trending along reference line.      Karie Soda, RN, CCM 02/25/2016 1:47 PM

## 2016-03-28 ENCOUNTER — Ambulatory Visit (INDEPENDENT_AMBULATORY_CARE_PROVIDER_SITE_OTHER): Payer: BLUE CROSS/BLUE SHIELD | Admitting: *Deleted

## 2016-03-28 DIAGNOSIS — I5022 Chronic systolic (congestive) heart failure: Secondary | ICD-10-CM | POA: Diagnosis not present

## 2016-03-28 DIAGNOSIS — Z95810 Presence of automatic (implantable) cardiac defibrillator: Secondary | ICD-10-CM | POA: Diagnosis not present

## 2016-03-28 DIAGNOSIS — I472 Ventricular tachycardia, unspecified: Secondary | ICD-10-CM

## 2016-03-28 LAB — CUP PACEART REMOTE DEVICE CHECK
Brady Statistic AP VP Percent: 1 %
Brady Statistic AS VP Percent: 1 %
Brady Statistic RA Percent Paced: 2.3 %
Brady Statistic RV Percent Paced: 1 %
HighPow Impedance: 65 Ohm
HighPow Impedance: 65 Ohm
Implantable Lead Implant Date: 20150313
Implantable Lead Location: 753859
Lead Channel Impedance Value: 390 Ohm
Lead Channel Pacing Threshold Pulse Width: 0.5 ms
Lead Channel Pacing Threshold Pulse Width: 0.5 ms
Lead Channel Setting Pacing Amplitude: 2.5 V
MDC IDC LEAD IMPLANT DT: 20150313
MDC IDC LEAD LOCATION: 753860
MDC IDC MSMT BATTERY REMAINING LONGEVITY: 76 mo
MDC IDC MSMT BATTERY REMAINING PERCENTAGE: 76 %
MDC IDC MSMT BATTERY VOLTAGE: 2.98 V
MDC IDC MSMT LEADCHNL RA PACING THRESHOLD AMPLITUDE: 0.75 V
MDC IDC MSMT LEADCHNL RA SENSING INTR AMPL: 3.8 mV
MDC IDC MSMT LEADCHNL RV IMPEDANCE VALUE: 480 Ohm
MDC IDC MSMT LEADCHNL RV PACING THRESHOLD AMPLITUDE: 0.75 V
MDC IDC MSMT LEADCHNL RV SENSING INTR AMPL: 6.9 mV
MDC IDC SESS DTM: 20170522060015
MDC IDC SET LEADCHNL RA PACING AMPLITUDE: 2 V
MDC IDC SET LEADCHNL RV PACING PULSEWIDTH: 0.5 ms
MDC IDC SET LEADCHNL RV SENSING SENSITIVITY: 0.5 mV
MDC IDC STAT BRADY AP VS PERCENT: 2.4 %
MDC IDC STAT BRADY AS VS PERCENT: 98 %
Pulse Gen Serial Number: 7142670

## 2016-03-28 NOTE — Progress Notes (Signed)
Remote ICD transmission.   

## 2016-03-29 NOTE — Progress Notes (Addendum)
EPIC Encounter for ICM Monitoring  Patient Name: Gary [name] is a 65 y.o. male Date: 03/29/2016 Primary Care Physican: Samuel Jester, DO Primary Cardiologist: Excell Seltzer Electrophysiologist: Allred Dry Weight: 121.8 lbs   In the past month, have you:  1. Gained more than 2 pounds in a day or more than 5 pounds in a week? No  2. Had changes in your medications (with verification of current medications)? No  3. Had more shortness of breath than is usual for you? No  4. Limited your activity because of shortness of breath? No  5. Not been able to sleep because of shortness of breath? No  6. Had increased swelling in your feet, ankles, legs or stomach area? No  7. Had symptoms of dehydration (dizziness, dry mouth, increased thirst, decreased urine output) No  8. Had changes in sodium restriction? No  9. Been compliant with medication? No  ICM trend: 3 month view for 03/28/2016   ICM trend: 1 year view for 03/28/2016   Follow-up plan: ICM clinic phone appointment 05/03/2016.    FLUID LEVELS:  Corvue thoracic impedance trending along baseline suggesting stable fluid levels.    SYMPTOMS:  None.  He stated he is feeling well.  Denied any fluid symptoms at this time.   RECOMMENDATIONS: No changes today.    Karie Soda, RN, CCM 03/29/2016 2:51 PM

## 2016-04-21 ENCOUNTER — Encounter: Payer: Self-pay | Admitting: Cardiology

## 2016-05-03 ENCOUNTER — Ambulatory Visit (INDEPENDENT_AMBULATORY_CARE_PROVIDER_SITE_OTHER): Payer: BLUE CROSS/BLUE SHIELD

## 2016-05-03 DIAGNOSIS — Z95810 Presence of automatic (implantable) cardiac defibrillator: Secondary | ICD-10-CM | POA: Diagnosis not present

## 2016-05-03 DIAGNOSIS — I5022 Chronic systolic (congestive) heart failure: Secondary | ICD-10-CM

## 2016-05-04 NOTE — Progress Notes (Signed)
EPIC Encounter for ICM Monitoring  Patient Name: Gary [name] is a 65 y.o. male Date: 05/04/2016 Primary Care Physican: Samuel Jester, DO Primary Cardiologist: Excell Seltzer Electrophysiologist: Allred Dry Weight: 120.4 lbs       In the past month, have you:  1. Gained more than 2 pounds in a day or more than 5 pounds in a week? No  2. Had changes in your medications (with verification of current medications)? No  3. Had more shortness of breath than is usual for you? No   4. Limited your activity because of shortness of breath? No   5. Not been able to sleep because of shortness of breath? No   6. Had increased swelling in your feet, ankles, legs or stomach area? No   7. Had symptoms of dehydration (dizziness, dry mouth, increased thirst, decreased urine output) No   8. Had changes in sodium restriction? No   9. Been compliant with medication? Yes   ICM trend: 3 month view for 05/03/2016   ICM trend: 1 year view for 05/03/2016   Follow-up plan: ICM clinic phone appointment 06/28/2016.   Office appointment with Gypsy Balsam, NP, 05/25/2016.    FLUID LEVELS: Corvue impedance suggesting stable fluid levels.     SYMPTOMS: None, denied any fluid symptoms such as weight gain of 3 pounds overnight or 5 pounds within a week, SOB and/or lower extremity swelling. Encouraged to call for any fluid symptoms.   EDUCATION: Limit sodium intake to < 2000 mg and fluid intake to 64 oz daily.     RECOMMENDATIONS: No changes today.       Karie Soda, RN, CCM 05/04/2016 8:13 AM

## 2016-05-06 ENCOUNTER — Encounter: Payer: Self-pay | Admitting: Nurse Practitioner

## 2016-05-12 ENCOUNTER — Other Ambulatory Visit: Payer: Self-pay | Admitting: Cardiovascular Disease

## 2016-05-25 ENCOUNTER — Encounter: Payer: BLUE CROSS/BLUE SHIELD | Admitting: Nurse Practitioner
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
## 2016-06-06 ENCOUNTER — Encounter (INDEPENDENT_AMBULATORY_CARE_PROVIDER_SITE_OTHER): Payer: Self-pay

## 2016-06-06 ENCOUNTER — Ambulatory Visit (INDEPENDENT_AMBULATORY_CARE_PROVIDER_SITE_OTHER): Payer: Medicare Other | Admitting: Physician Assistant

## 2016-06-06 ENCOUNTER — Encounter: Payer: Self-pay | Admitting: Physician Assistant

## 2016-06-06 DIAGNOSIS — I472 Ventricular tachycardia, unspecified: Secondary | ICD-10-CM

## 2016-06-06 DIAGNOSIS — I2510 Atherosclerotic heart disease of native coronary artery without angina pectoris: Secondary | ICD-10-CM

## 2016-06-06 DIAGNOSIS — I10 Essential (primary) hypertension: Secondary | ICD-10-CM | POA: Diagnosis not present

## 2016-06-06 DIAGNOSIS — I255 Ischemic cardiomyopathy: Secondary | ICD-10-CM

## 2016-06-06 NOTE — Patient Instructions (Addendum)
Medication Instructions:   Your physician recommends that you continue on your current medications as directed. Please refer to the Current Medication list given to you today.   If you need a refill on your cardiac medications before your next appointment, please call your pharmacy.  Labwork:NONE ORDER TODAY    Testing/Procedures: NONE ORDER TODAY    Follow-Up: NEXT AVAILABLE WITH WITH DR Excell Seltzer   Your physician wants you to follow-up in: ONE YEAR WITH ALLRED You will receive a reminder letter in the mail two months in advance. If you don't receive a letter, please call our office to schedule the follow-up appointment.  Remote monitoring is used to monitor your Pacemaker of ICD from home. This monitoring reduces the number of office visits required to check your device to one time per year. It allows [place] to keep an eye on the functioning of your device to ensure it is working properly. You are scheduled for a device check from home on .09/06/2016..You may send your transmission at any time that day. If you have a wireless device, the transmission will be sent automatically. After your physician reviews your transmission, you will receive a postcard with your next transmission date.     Any Other Special Instructions Will Be Listed Below (If Applicable).

## 2016-06-10 ENCOUNTER — Encounter: Payer: Self-pay | Admitting: Internal Medicine

## 2016-06-28 ENCOUNTER — Ambulatory Visit (INDEPENDENT_AMBULATORY_CARE_PROVIDER_SITE_OTHER): Payer: BLUE CROSS/BLUE SHIELD

## 2016-06-28 DIAGNOSIS — Z95810 Presence of automatic (implantable) cardiac defibrillator: Secondary | ICD-10-CM | POA: Diagnosis not present

## 2016-06-28 DIAGNOSIS — I5022 Chronic systolic (congestive) heart failure: Secondary | ICD-10-CM

## 2016-06-28 NOTE — Progress Notes (Signed)
EPIC Encounter for ICM Monitoring  Patient Name: Gary [name] is a 65 y.o. male Date: 06/28/2016 Primary Care Physican: Samuel Jester, DO Primary Cardiologist: Excell Seltzer Electrophysiologist: Allred Dry Weight: 122 lb      Heart Failure questions reviewed, pt asymptomatic   Thoracic impedance normal.  Recommendations: No changes.  Low sodium diet education provided.    Follow-up plan: ICM clinic phone appointment on 07/29/2016.  Copy of ICM check sent to device physician.   ICM trend: 06/28/2016       Karie Soda, RN 06/28/2016 9:24 AM

## 2016-07-14 DIAGNOSIS — I213 ST elevation (STEMI) myocardial infarction of unspecified site: Secondary | ICD-10-CM | POA: Diagnosis not present

## 2016-07-14 DIAGNOSIS — E785 Hyperlipidemia, unspecified: Secondary | ICD-10-CM | POA: Diagnosis not present

## 2016-07-14 DIAGNOSIS — F419 Anxiety disorder, unspecified: Secondary | ICD-10-CM | POA: Diagnosis not present

## 2016-07-14 DIAGNOSIS — I10 Essential (primary) hypertension: Secondary | ICD-10-CM | POA: Diagnosis not present

## 2016-07-14 DIAGNOSIS — E559 Vitamin D deficiency, unspecified: Secondary | ICD-10-CM | POA: Diagnosis not present

## 2016-07-29 ENCOUNTER — Ambulatory Visit (INDEPENDENT_AMBULATORY_CARE_PROVIDER_SITE_OTHER): Payer: BLUE CROSS/BLUE SHIELD

## 2016-07-29 ENCOUNTER — Telehealth: Payer: Self-pay

## 2016-07-29 DIAGNOSIS — I5022 Chronic systolic (congestive) heart failure: Secondary | ICD-10-CM

## 2016-07-29 DIAGNOSIS — Z95810 Presence of automatic (implantable) cardiac defibrillator: Secondary | ICD-10-CM

## 2016-07-29 NOTE — Telephone Encounter (Signed)
Patient left voice mail asking for return call.  Attempted ICM call and left message.

## 2016-07-29 NOTE — Progress Notes (Signed)
EPIC Encounter for ICM Monitoring  Patient Name: Gary [name] is a 65 y.o. male Date: 07/29/2016 Primary Care Physican: Samuel Jester, DO Primary Cardiologist:Cooper Electrophysiologist: Allred Dry Weight:  unknown  Attempted ICM call and unable to reach.  Transmission reviewed.   Thoracic impedance normal.  Follow-up plan: ICM clinic phone appointment on 08/29/2016 which is same day as office visit with Dr Excell Seltzer.   Copy of ICM check sent to device physician.   ICM trend: 07/29/2016       Karie Soda, RN 07/29/2016 9:39 AM

## 2016-08-08 ENCOUNTER — Other Ambulatory Visit: Payer: Self-pay | Admitting: Cardiovascular Disease

## 2016-08-29 ENCOUNTER — Encounter: Payer: Self-pay | Admitting: Cardiovascular Disease

## 2016-08-29 ENCOUNTER — Ambulatory Visit (INDEPENDENT_AMBULATORY_CARE_PROVIDER_SITE_OTHER): Payer: Medicare Other | Admitting: Cardiovascular Disease

## 2016-08-29 ENCOUNTER — Ambulatory Visit (INDEPENDENT_AMBULATORY_CARE_PROVIDER_SITE_OTHER): Payer: BLUE CROSS/BLUE SHIELD

## 2016-08-29 VITALS — BP 140/80 | HR 58 | Ht 66.0 in | Wt 134.1 lb

## 2016-08-29 DIAGNOSIS — I5022 Chronic systolic (congestive) heart failure: Secondary | ICD-10-CM

## 2016-08-29 DIAGNOSIS — I255 Ischemic cardiomyopathy: Secondary | ICD-10-CM

## 2016-08-29 DIAGNOSIS — I2589 Other forms of chronic ischemic heart disease: Secondary | ICD-10-CM

## 2016-08-29 DIAGNOSIS — E785 Hyperlipidemia, unspecified: Secondary | ICD-10-CM | POA: Diagnosis not present

## 2016-08-29 DIAGNOSIS — Z95810 Presence of automatic (implantable) cardiac defibrillator: Secondary | ICD-10-CM

## 2016-08-29 NOTE — Progress Notes (Signed)
EPIC Encounter for ICM Monitoring  Patient Name: Gary [name] is a 65 y.o. male Date: 08/29/2016 Primary Care Physican: Samuel Jester, DO Primary Cardiologist:Cooper Electrophysiologist: Allred Dry Weight:     unknown      Patient in office with Dr Excell Seltzer today and reviewed transmission with Dr Excell Seltzer.  Thoracic impedance normal   Recommendations:  No changes.     Follow-up plan: ICM clinic phone appointment on 10/04/2016.  Copy of ICM check sent to primary cardiologist and device physician.   ICM trend: 08/29/2016       Karie Soda, RN 08/29/2016 9:28 AM

## 2016-08-29 NOTE — Patient Instructions (Signed)
Medication Instructions:  Your physician recommends that you continue on your current medications as directed. Please refer to the Current Medication list given to you today.  Labwork: Your physician recommends that you return for a FASTING LIPID and CMP--nothing to eat or drink after midnight (do the same day as Echo)  Testing/Procedures: Your physician has requested that you have an echocardiogram. Echocardiography is a painless test that uses sound waves to create images of your heart. It provides your doctor with information about the size and shape of your heart and how well your heart's chambers and valves are working. This procedure takes approximately one hour. There are no restrictions for this procedure.  Follow-Up: Your physician recommends that you schedule a follow-up appointment in: 6 MONTHS with [name] PA-C   Any Other Special Instructions Will Be Listed Below (If Applicable).     If you need a refill on your cardiac medications before your next appointment, please call your pharmacy.
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
## 2016-08-30 NOTE — Progress Notes (Signed)
Returned patient call and reviewed transmission.  He visited Dr Excell Seltzer yesterday and no changes.  Advised next ICM remote transmission will be 10/04/2016.

## 2016-09-06 ENCOUNTER — Telehealth: Payer: Self-pay | Admitting: Cardiology

## 2016-09-06 ENCOUNTER — Ambulatory Visit (INDEPENDENT_AMBULATORY_CARE_PROVIDER_SITE_OTHER): Payer: Medicare Other | Admitting: *Deleted

## 2016-09-06 DIAGNOSIS — Z95810 Presence of automatic (implantable) cardiac defibrillator: Secondary | ICD-10-CM

## 2016-09-06 DIAGNOSIS — I255 Ischemic cardiomyopathy: Secondary | ICD-10-CM

## 2016-09-06 NOTE — Telephone Encounter (Signed)
LMOVM reminding pt to send remote transmission.   

## 2016-09-06 NOTE — Telephone Encounter (Signed)
Returned patient call and advised to send remote transmission for 91 day follow up.  Received transmission.

## 2016-09-07 NOTE — Progress Notes (Signed)
Remote ICD transmission.   

## 2016-09-14 ENCOUNTER — Encounter: Payer: Self-pay | Admitting: Cardiology

## 2016-09-16 ENCOUNTER — Other Ambulatory Visit (HOSPITAL_COMMUNITY): Payer: BLUE CROSS/BLUE SHIELD

## 2016-09-19 ENCOUNTER — Ambulatory Visit (HOSPITAL_COMMUNITY): Payer: Medicare Other | Attending: Cardiology

## 2016-09-19 ENCOUNTER — Other Ambulatory Visit: Payer: Medicare Other | Admitting: *Deleted

## 2016-09-19 ENCOUNTER — Other Ambulatory Visit: Payer: Self-pay

## 2016-09-19 DIAGNOSIS — I340 Nonrheumatic mitral (valve) insufficiency: Secondary | ICD-10-CM | POA: Diagnosis not present

## 2016-09-19 DIAGNOSIS — I110 Hypertensive heart disease with heart failure: Secondary | ICD-10-CM | POA: Insufficient documentation

## 2016-09-19 DIAGNOSIS — E785 Hyperlipidemia, unspecified: Secondary | ICD-10-CM | POA: Diagnosis not present

## 2016-09-19 DIAGNOSIS — I358 Other nonrheumatic aortic valve disorders: Secondary | ICD-10-CM | POA: Insufficient documentation

## 2016-09-19 DIAGNOSIS — I071 Rheumatic tricuspid insufficiency: Secondary | ICD-10-CM | POA: Diagnosis not present

## 2016-09-19 DIAGNOSIS — Z87891 Personal history of nicotine dependence: Secondary | ICD-10-CM | POA: Insufficient documentation

## 2016-09-19 DIAGNOSIS — I2510 Atherosclerotic heart disease of native coronary artery without angina pectoris: Secondary | ICD-10-CM | POA: Insufficient documentation

## 2016-09-19 DIAGNOSIS — I255 Ischemic cardiomyopathy: Secondary | ICD-10-CM | POA: Insufficient documentation

## 2016-09-19 DIAGNOSIS — I472 Ventricular tachycardia: Secondary | ICD-10-CM | POA: Insufficient documentation

## 2016-09-19 DIAGNOSIS — I5022 Chronic systolic (congestive) heart failure: Secondary | ICD-10-CM | POA: Diagnosis not present

## 2016-09-19 DIAGNOSIS — I509 Heart failure, unspecified: Secondary | ICD-10-CM | POA: Diagnosis present

## 2016-09-19 LAB — COMPREHENSIVE METABOLIC PANEL
ALK PHOS: 62 U/L (ref 40–115)
ALT: 14 U/L (ref 9–46)
AST: 13 U/L (ref 10–35)
Albumin: 4 g/dL (ref 3.6–5.1)
BILIRUBIN TOTAL: 0.6 mg/dL (ref 0.2–1.2)
BUN: 12 mg/dL (ref 7–25)
CALCIUM: 9.4 mg/dL (ref 8.6–10.3)
CO2: 30 mmol/L (ref 20–31)
Chloride: 104 mmol/L (ref 98–110)
Creat: 0.83 mg/dL (ref 0.70–1.25)
GLUCOSE: 98 mg/dL (ref 65–99)
POTASSIUM: 4.3 mmol/L (ref 3.5–5.3)
Sodium: 141 mmol/L (ref 135–146)
TOTAL PROTEIN: 6.3 g/dL (ref 6.1–8.1)

## 2016-09-19 LAB — LIPID PANEL
CHOL/HDL RATIO: 10.5 ratio — AB (ref <5.0)
CHOLESTEROL: 231 mg/dL — AB (ref <200)
HDL: 22 mg/dL — AB (ref >40)
LDL Cholesterol: 144 mg/dL — ABNORMAL HIGH (ref <100)
Triglycerides: 324 mg/dL — ABNORMAL HIGH (ref <150)
VLDL: 65 mg/dL — ABNORMAL HIGH (ref <30)

## 2016-09-19 NOTE — Addendum Note (Signed)
Addended by: Tonita Phoenix on: 09/19/2016 07:58 AM   Modules accepted: Orders

## 2016-10-04 ENCOUNTER — Ambulatory Visit (INDEPENDENT_AMBULATORY_CARE_PROVIDER_SITE_OTHER): Payer: Medicare Other

## 2016-10-04 DIAGNOSIS — Z95810 Presence of automatic (implantable) cardiac defibrillator: Secondary | ICD-10-CM | POA: Diagnosis not present

## 2016-10-04 DIAGNOSIS — I5022 Chronic systolic (congestive) heart failure: Secondary | ICD-10-CM

## 2016-10-04 DIAGNOSIS — I255 Ischemic cardiomyopathy: Secondary | ICD-10-CM

## 2016-10-04 NOTE — Progress Notes (Signed)
EPIC Encounter for ICM Monitoring  Patient Name: Gary [name] is a 65 y.o. male Date: 10/04/2016 Primary Care Physican: Samuel Jester, DO Primary Cardiologist:Cooper Electrophysiologist: Allred Dry Weight:unknown             Heart Failure questions reviewed, pt asymptomatic   Thoracic impedance normal   Recommendations: No changes.  Reinforced low salt food choices and limiting fluid intake to < 2 liters per day. Encouraged to call for fluid symptoms.    Follow-up plan: ICM clinic phone appointment on 11/04/2016.  Copy of ICM check sent to device physician.   ICM trend: 10/04/2016       Karie Soda, RN 10/04/2016 5:14 PM

## 2016-10-06 LAB — CUP PACEART REMOTE DEVICE CHECK
Battery Remaining Longevity: 72 mo
Battery Voltage: 2.98 V
Brady Statistic AP VS Percent: 1 %
Brady Statistic AS VP Percent: 1 %
Date Time Interrogation Session: 20171031163628
HIGH POWER IMPEDANCE MEASURED VALUE: 66 Ohm
HighPow Impedance: 66 Ohm
Implantable Lead Implant Date: 20150313
Implantable Lead Location: 753859
Implantable Lead Location: 753860
Lead Channel Impedance Value: 390 Ohm
Lead Channel Pacing Threshold Pulse Width: 0.5 ms
Lead Channel Sensing Intrinsic Amplitude: 7.4 mV
Lead Channel Setting Pacing Amplitude: 2 V
Lead Channel Setting Sensing Sensitivity: 0.5 mV
MDC IDC LEAD IMPLANT DT: 20150313
MDC IDC MSMT BATTERY REMAINING PERCENTAGE: 72 %
MDC IDC MSMT LEADCHNL RA PACING THRESHOLD AMPLITUDE: 0.75 V
MDC IDC MSMT LEADCHNL RA SENSING INTR AMPL: 3.6 mV
MDC IDC MSMT LEADCHNL RV IMPEDANCE VALUE: 480 Ohm
MDC IDC MSMT LEADCHNL RV PACING THRESHOLD AMPLITUDE: 1 V
MDC IDC MSMT LEADCHNL RV PACING THRESHOLD PULSEWIDTH: 0.5 ms
MDC IDC PG IMPLANT DT: 20150313
MDC IDC SET LEADCHNL RV PACING AMPLITUDE: 2.5 V
MDC IDC SET LEADCHNL RV PACING PULSEWIDTH: 0.5 ms
MDC IDC STAT BRADY AP VP PERCENT: 1 %
MDC IDC STAT BRADY AS VS PERCENT: 98 %
MDC IDC STAT BRADY RA PERCENT PACED: 1 %
MDC IDC STAT BRADY RV PERCENT PACED: 1 %
Pulse Gen Serial Number: 7142670

## 2016-10-07 DIAGNOSIS — I480 Paroxysmal atrial fibrillation: Secondary | ICD-10-CM

## 2016-10-07 HISTORY — DX: Paroxysmal atrial fibrillation: I48.0

## 2016-10-11 ENCOUNTER — Telehealth: Payer: Self-pay | Admitting: Cardiovascular Disease

## 2016-10-11 NOTE — Telephone Encounter (Signed)
Follow Up:     Daughter would like his echo and lab results from 09-19-16 please.

## 2016-10-11 NOTE — Telephone Encounter (Signed)
Pt's daughter [name] is aware of lab and echo results and MD's recommendations. Pt's daughter verbalized understanding.

## 2016-11-04 ENCOUNTER — Other Ambulatory Visit: Payer: Self-pay | Admitting: Cardiovascular Disease

## 2016-11-04 ENCOUNTER — Other Ambulatory Visit: Payer: Self-pay | Admitting: Physician Assistant

## 2016-11-04 ENCOUNTER — Ambulatory Visit (INDEPENDENT_AMBULATORY_CARE_PROVIDER_SITE_OTHER): Payer: Medicare Other

## 2016-11-04 DIAGNOSIS — I5022 Chronic systolic (congestive) heart failure: Secondary | ICD-10-CM | POA: Diagnosis not present

## 2016-11-04 DIAGNOSIS — Z95810 Presence of automatic (implantable) cardiac defibrillator: Secondary | ICD-10-CM

## 2016-11-04 NOTE — Progress Notes (Signed)
EPIC Encounter for ICM Monitoring  Patient Name: Gary [name] is a 65 y.o. male Date: 11/04/2016 Primary Care Physican: Samuel Jester, DO Primary Cardiologist:Cooper Electrophysiologist: Allred Dry Weight:unknown       Heart Failure questions reviewed, pt asymptomatic.  He has some sinus drainage but overall feeling good.   Thoracic impedance normal   Recommendations: No changes.  Reinforced to limit low salt food choices to 2000 mg day and limiting fluid intake to < 2 liters per day. Encouraged to call for fluid symptoms.    Follow-up plan: ICM clinic phone appointment on 12/06/2016.  Copy of ICM check sent to device physician.   3 month ICM trend : 11/04/2016   1 Year ICM trend:      Karie Soda, RN 11/04/2016 9:15 AM

## 2016-12-06 ENCOUNTER — Ambulatory Visit (INDEPENDENT_AMBULATORY_CARE_PROVIDER_SITE_OTHER): Payer: Medicare HMO | Admitting: *Deleted

## 2016-12-06 DIAGNOSIS — I5022 Chronic systolic (congestive) heart failure: Secondary | ICD-10-CM

## 2016-12-06 DIAGNOSIS — I255 Ischemic cardiomyopathy: Secondary | ICD-10-CM

## 2016-12-06 DIAGNOSIS — Z95810 Presence of automatic (implantable) cardiac defibrillator: Secondary | ICD-10-CM

## 2016-12-06 NOTE — Progress Notes (Signed)
Remote ICD transmission.   

## 2016-12-06 NOTE — Progress Notes (Signed)
EPIC Encounter for ICM Monitoring  Patient Name: Gary [name] is a 65 y.o. male Date: 12/06/2016 Primary Care Physican: Samuel Jester, DO Primary Cardiologist:Cooper Electrophysiologist: Allred Dry Weight:124 lbs      Heart Failure questions reviewed, pt asymptomatic   Thoracic impedance normal   Recommendations: No changes. Reminded to limit dietary salt intake to 2000 mg/day and fluid intake to < 2 liters/day. Encouraged to call for fluid symptoms.  Follow-up plan: ICM clinic phone appointment on 01/06/2017.  Copy of ICM check sent to device physician.   3 month ICM trend: 12/06/2016   1 Year ICM trend:      Karie Soda, RN 12/06/2016 9:47 AM

## 2016-12-07 ENCOUNTER — Encounter: Payer: Self-pay | Admitting: Cardiology

## 2016-12-15 LAB — CUP PACEART REMOTE DEVICE CHECK
Battery Remaining Longevity: 71 mo
Battery Remaining Percentage: 70 %
Battery Voltage: 2.96 V
Brady Statistic AP VP Percent: 1 %
Date Time Interrogation Session: 20180130070025
HIGH POWER IMPEDANCE MEASURED VALUE: 66 Ohm
HighPow Impedance: 66 Ohm
Implantable Lead Implant Date: 20150313
Implantable Lead Location: 753859
Implantable Lead Location: 753860
Implantable Pulse Generator Implant Date: 20150313
Lead Channel Impedance Value: 400 Ohm
Lead Channel Impedance Value: 450 Ohm
Lead Channel Pacing Threshold Pulse Width: 0.5 ms
Lead Channel Sensing Intrinsic Amplitude: 11.8 mV
Lead Channel Sensing Intrinsic Amplitude: 4.2 mV
Lead Channel Setting Pacing Amplitude: 2 V
Lead Channel Setting Pacing Pulse Width: 0.5 ms
MDC IDC LEAD IMPLANT DT: 20150313
MDC IDC MSMT LEADCHNL RA PACING THRESHOLD AMPLITUDE: 0.75 V
MDC IDC MSMT LEADCHNL RV PACING THRESHOLD AMPLITUDE: 1 V
MDC IDC MSMT LEADCHNL RV PACING THRESHOLD PULSEWIDTH: 0.5 ms
MDC IDC SET LEADCHNL RV PACING AMPLITUDE: 2.5 V
MDC IDC SET LEADCHNL RV SENSING SENSITIVITY: 0.5 mV
MDC IDC STAT BRADY AP VS PERCENT: 1 %
MDC IDC STAT BRADY AS VP PERCENT: 1 %
MDC IDC STAT BRADY AS VS PERCENT: 99 %
MDC IDC STAT BRADY RA PERCENT PACED: 1 %
MDC IDC STAT BRADY RV PERCENT PACED: 1 %
Pulse Gen Serial Number: 7142670

## 2016-12-21 DIAGNOSIS — I10 Essential (primary) hypertension: Secondary | ICD-10-CM | POA: Diagnosis not present

## 2016-12-21 DIAGNOSIS — Z550 Illiteracy and low-level literacy: Secondary | ICD-10-CM | POA: Diagnosis not present

## 2016-12-21 DIAGNOSIS — F329 Major depressive disorder, single episode, unspecified: Secondary | ICD-10-CM | POA: Diagnosis not present

## 2016-12-21 DIAGNOSIS — F419 Anxiety disorder, unspecified: Secondary | ICD-10-CM | POA: Diagnosis not present

## 2016-12-21 DIAGNOSIS — I2510 Atherosclerotic heart disease of native coronary artery without angina pectoris: Secondary | ICD-10-CM | POA: Diagnosis not present

## 2016-12-21 DIAGNOSIS — Z634 Disappearance and death of family member: Secondary | ICD-10-CM | POA: Diagnosis not present

## 2016-12-21 DIAGNOSIS — R69 Illness, unspecified: Secondary | ICD-10-CM | POA: Diagnosis not present

## 2016-12-21 DIAGNOSIS — M5030 Other cervical disc degeneration, unspecified cervical region: Secondary | ICD-10-CM | POA: Diagnosis not present

## 2016-12-21 DIAGNOSIS — F17210 Nicotine dependence, cigarettes, uncomplicated: Secondary | ICD-10-CM | POA: Diagnosis not present

## 2016-12-21 DIAGNOSIS — E782 Mixed hyperlipidemia: Secondary | ICD-10-CM | POA: Diagnosis not present

## 2016-12-21 DIAGNOSIS — Z6821 Body mass index (BMI) 21.0-21.9, adult: Secondary | ICD-10-CM | POA: Diagnosis not present

## 2017-01-04 ENCOUNTER — Encounter (HOSPITAL_COMMUNITY): Payer: Self-pay

## 2017-01-04 ENCOUNTER — Emergency Department (HOSPITAL_COMMUNITY): Payer: Medicare HMO

## 2017-01-04 ENCOUNTER — Observation Stay (HOSPITAL_COMMUNITY)
Admission: EM | Admit: 2017-01-04 | Discharge: 2017-01-05 | Disposition: A | Discharge status: Home / Self Care | Payer: Medicare HMO | Attending: Interventional Cardiology | Admitting: Interventional Cardiology

## 2017-01-04 DIAGNOSIS — R69 Illness, unspecified: Secondary | ICD-10-CM | POA: Diagnosis not present

## 2017-01-04 DIAGNOSIS — R0789 Other chest pain: Secondary | ICD-10-CM | POA: Diagnosis not present

## 2017-01-04 DIAGNOSIS — F17210 Nicotine dependence, cigarettes, uncomplicated: Secondary | ICD-10-CM | POA: Insufficient documentation

## 2017-01-04 DIAGNOSIS — I472 Ventricular tachycardia, unspecified: Secondary | ICD-10-CM

## 2017-01-04 DIAGNOSIS — I255 Ischemic cardiomyopathy: Secondary | ICD-10-CM | POA: Diagnosis not present

## 2017-01-04 DIAGNOSIS — I5022 Chronic systolic (congestive) heart failure: Secondary | ICD-10-CM | POA: Diagnosis not present

## 2017-01-04 DIAGNOSIS — I340 Nonrheumatic mitral (valve) insufficiency: Secondary | ICD-10-CM | POA: Diagnosis not present

## 2017-01-04 DIAGNOSIS — I249 Acute ischemic heart disease, unspecified: Secondary | ICD-10-CM

## 2017-01-04 DIAGNOSIS — I252 Old myocardial infarction: Secondary | ICD-10-CM | POA: Diagnosis not present

## 2017-01-04 DIAGNOSIS — I110 Hypertensive heart disease with heart failure: Secondary | ICD-10-CM | POA: Insufficient documentation

## 2017-01-04 DIAGNOSIS — Z95810 Presence of automatic (implantable) cardiac defibrillator: Secondary | ICD-10-CM | POA: Insufficient documentation

## 2017-01-04 DIAGNOSIS — I2510 Atherosclerotic heart disease of native coronary artery without angina pectoris: Secondary | ICD-10-CM | POA: Diagnosis present

## 2017-01-04 DIAGNOSIS — E785 Hyperlipidemia, unspecified: Secondary | ICD-10-CM | POA: Diagnosis not present

## 2017-01-04 DIAGNOSIS — E7800 Pure hypercholesterolemia, unspecified: Secondary | ICD-10-CM | POA: Diagnosis present

## 2017-01-04 DIAGNOSIS — F419 Anxiety disorder, unspecified: Secondary | ICD-10-CM | POA: Diagnosis not present

## 2017-01-04 DIAGNOSIS — I10 Essential (primary) hypertension: Secondary | ICD-10-CM | POA: Diagnosis not present

## 2017-01-04 DIAGNOSIS — M19011 Primary osteoarthritis, right shoulder: Secondary | ICD-10-CM | POA: Insufficient documentation

## 2017-01-04 DIAGNOSIS — I25110 Atherosclerotic heart disease of native coronary artery with unstable angina pectoris: Principal | ICD-10-CM | POA: Insufficient documentation

## 2017-01-04 DIAGNOSIS — R079 Chest pain, unspecified: Secondary | ICD-10-CM | POA: Diagnosis not present

## 2017-01-04 DIAGNOSIS — Z888 Allergy status to other drugs, medicaments and biological substances status: Secondary | ICD-10-CM | POA: Insufficient documentation

## 2017-01-04 DIAGNOSIS — Z885 Allergy status to narcotic agent status: Secondary | ICD-10-CM | POA: Insufficient documentation

## 2017-01-04 DIAGNOSIS — I200 Unstable angina: Secondary | ICD-10-CM | POA: Diagnosis not present

## 2017-01-04 LAB — CBC WITH DIFFERENTIAL/PLATELET
BASOS ABS: 0 10*3/uL (ref 0.0–0.1)
Basophils Relative: 0 %
EOS PCT: 3 %
Eosinophils Absolute: 0.3 10*3/uL (ref 0.0–0.7)
HEMATOCRIT: 47.1 % (ref 39.0–52.0)
Hemoglobin: 15.8 g/dL (ref 13.0–17.0)
LYMPHS ABS: 2.7 10*3/uL (ref 0.7–4.0)
LYMPHS PCT: 25 %
MCH: 31.8 pg (ref 26.0–34.0)
MCHC: 33.5 g/dL (ref 30.0–36.0)
MCV: 94.8 fL (ref 78.0–100.0)
MONO ABS: 0.9 10*3/uL (ref 0.1–1.0)
MONOS PCT: 8 %
NEUTROS ABS: 7 10*3/uL (ref 1.7–7.7)
Neutrophils Relative %: 64 %
PLATELETS: 210 10*3/uL (ref 150–400)
RBC: 4.97 MIL/uL (ref 4.22–5.81)
RDW: 13.7 % (ref 11.5–15.5)
WBC: 11 10*3/uL — ABNORMAL HIGH (ref 4.0–10.5)

## 2017-01-04 LAB — I-STAT TROPONIN, ED: Troponin i, poc: 0.01 ng/mL (ref 0.00–0.08)

## 2017-01-04 LAB — BASIC METABOLIC PANEL
Anion gap: 10 (ref 5–15)
BUN: 16 mg/dL (ref 6–20)
CO2: 26 mmol/L (ref 22–32)
Calcium: 9.4 mg/dL (ref 8.9–10.3)
Chloride: 100 mmol/L — ABNORMAL LOW (ref 101–111)
Creatinine, Ser: 0.9 mg/dL (ref 0.61–1.24)
GFR calc Af Amer: >60 mL/min (ref >60)
GLUCOSE: 142 mg/dL — AB (ref 65–99)
POTASSIUM: 4.3 mmol/L (ref 3.5–5.1)
Sodium: 136 mmol/L (ref 135–145)

## 2017-01-04 LAB — PROTIME-INR
INR: 1.15
Prothrombin Time: 14.7 seconds (ref 11.4–15.2)

## 2017-01-04 LAB — TSH: TSH: 1.153 u[IU]/mL (ref 0.350–4.500)

## 2017-01-04 LAB — TROPONIN I: TROPONIN I: 0.03 ng/mL — AB (ref <0.03)

## 2017-01-04 LAB — BRAIN NATRIURETIC PEPTIDE: B Natriuretic Peptide: 50.4 pg/mL (ref 0.0–100.0)

## 2017-01-04 MED ORDER — SODIUM CHLORIDE 0.9 % IV SOLN: 250.0000 mL | INTRAVENOUS | Status: DC | PRN

## 2017-01-04 MED ORDER — SODIUM CHLORIDE 0.9% FLUSH
3.0000 mL | Freq: Two times a day (BID) | INTRAVENOUS | Status: DC
  Administered 2017-01-04 – 2017-01-05 (×2): 3 mL via INTRAVENOUS

## 2017-01-04 MED ORDER — ALPRAZOLAM 0.5 MG PO TABS
0.5000 mg | ORAL_TABLET | Freq: Three times a day (TID) | ORAL | Status: DC
  Administered 2017-01-04 – 2017-01-05 (×3): 0.5 mg via ORAL
  Filled 2017-01-04 (×3): qty 1

## 2017-01-04 MED ORDER — SODIUM CHLORIDE 0.9% FLUSH
3.0000 mL | Freq: Two times a day (BID) | INTRAVENOUS | Status: DC
  Administered 2017-01-05: 3 mL via INTRAVENOUS

## 2017-01-04 MED ORDER — ACETAMINOPHEN 500 MG PO TABS: 500.0000 mg | ORAL_TABLET | Freq: Four times a day (QID) | ORAL | Status: DC | PRN

## 2017-01-04 MED ORDER — BOOST HIGH PROTEIN PO LIQD
1.0000 | ORAL | Status: DC
  Administered 2017-01-05: 237 mL via ORAL
  Filled 2017-01-04 (×2): qty 237

## 2017-01-04 MED ORDER — SODIUM CHLORIDE 0.9 % IV SOLN
250.0000 mL | INTRAVENOUS | Status: DC | PRN
Start: 2017-01-04 — End: 2017-01-05

## 2017-01-04 MED ORDER — LOSARTAN POTASSIUM 50 MG PO TABS
50.0000 mg | ORAL_TABLET | Freq: Every day | ORAL | Status: DC
  Administered 2017-01-04 – 2017-01-05 (×2): 50 mg via ORAL
  Filled 2017-01-04 (×2): qty 1

## 2017-01-04 MED ORDER — ASPIRIN 81 MG PO CHEW
81.0000 mg | CHEWABLE_TABLET | ORAL | Status: AC
  Administered 2017-01-05: 81 mg via ORAL
  Filled 2017-01-04: qty 1

## 2017-01-04 MED ORDER — CARVEDILOL 25 MG PO TABS: 25.0000 mg | ORAL_TABLET | Freq: Two times a day (BID) | ORAL | Status: DC

## 2017-01-04 MED ORDER — HEPARIN BOLUS VIA INFUSION
3300.0000 [IU] | Freq: Once | INTRAVENOUS | Status: AC
  Administered 2017-01-04: 3300 [IU] via INTRAVENOUS
  Filled 2017-01-04: qty 3300

## 2017-01-04 MED ORDER — HEPARIN (PORCINE) IN NACL 100-0.45 UNIT/ML-% IJ SOLN
850.0000 [IU]/h | INTRAMUSCULAR | Status: DC
  Administered 2017-01-04: 650 [IU]/h via INTRAVENOUS
  Filled 2017-01-04: qty 250

## 2017-01-04 MED ORDER — SODIUM CHLORIDE 0.9% FLUSH: 3.0000 mL | INTRAVENOUS | Status: DC | PRN

## 2017-01-04 MED ORDER — ASPIRIN EC 81 MG PO TBEC: 81.0000 mg | DELAYED_RELEASE_TABLET | Freq: Every day | ORAL | Status: DC

## 2017-01-04 MED ORDER — CARVEDILOL 25 MG PO TABS
25.0000 mg | ORAL_TABLET | Freq: Two times a day (BID) | ORAL | Status: DC
  Administered 2017-01-05 (×2): 25 mg via ORAL
  Filled 2017-01-04 (×2): qty 1

## 2017-01-04 MED ORDER — ISOSORBIDE MONONITRATE ER 30 MG PO TB24
30.0000 mg | ORAL_TABLET | Freq: Every day | ORAL | Status: DC
  Administered 2017-01-05: 30 mg via ORAL
  Filled 2017-01-04: qty 1

## 2017-01-04 MED ORDER — SODIUM CHLORIDE 0.9 % IV SOLN
INTRAVENOUS | Status: DC
  Administered 2017-01-05: 10 mL/h via INTRAVENOUS

## 2017-01-04 MED ORDER — ONDANSETRON HCL 4 MG/2ML IJ SOLN: 4.0000 mg | Freq: Four times a day (QID) | INTRAMUSCULAR | Status: DC | PRN

## 2017-01-04 MED ORDER — ALPRAZOLAM 0.25 MG PO TABS: 0.2500 mg | ORAL_TABLET | Freq: Two times a day (BID) | ORAL | Status: DC | PRN

## 2017-01-04 MED ORDER — SENNA 8.6 MG PO TABS
1.0000 | ORAL_TABLET | ORAL | Status: DC
  Administered 2017-01-05: 8.6 mg via ORAL
  Filled 2017-01-04: qty 1

## 2017-01-04 MED ORDER — NITROGLYCERIN 0.4 MG SL SUBL: 0.4000 mg | SUBLINGUAL_TABLET | SUBLINGUAL | Status: DC | PRN

## 2017-01-04 MED ORDER — ACETAMINOPHEN 325 MG PO TABS: 650.0000 mg | ORAL_TABLET | ORAL | Status: DC | PRN

## 2017-01-04 NOTE — Progress Notes (Signed)
ANTICOAGULATION CONSULT NOTE - Initial Consult  Pharmacy Consult for heparin Indication: chest pain/ACS  Allergies  Allergen Reactions  . Morphine And Related     Extreme agitation (pulls out IVs)  . Codeine Other (See Comments)    Patient "is not himself", gets aggitated  . Statins     Causes muscle fatigue    Patient Measurements: Height: 5\' 6"  (167.6 cm) Weight: 125 lb (56.7 kg) IBW/kg (Calculated) : 63.8 Heparin Dosing Weight: 56 kg  Vital Signs: Temp: 99.3 F (37.4 C) (02/28 1817) Temp Source: Oral (02/28 1817) BP: 183/91 (02/28 1817) Pulse Rate: 51 (02/28 1817)  Labs: No results for input(s): HGB, HCT, PLT, APTT, LABPROT, INR, HEPARINUNFRC, HEPRLOWMOCWT, CREATININE, CKTOTAL, CKMB, TROPONINI in the last 72 hours.  CrCl cannot be calculated (Patient's most recent lab result is older than the maximum 21 days allowed.).   Medical History: Past Medical History:  Diagnosis Date  . Anxiety   . Arthritis    "right shoulder" (01/17/2014)  . Broken neck (HCC) 2000  . CAD (coronary artery disease)    a. NSTEMI 09/2013: - s/p balloon angioplasty of D1, c/b dissection but flow restored with prolonged balloon inflations.  . Hyperlipidemia    a. intolerant to statins. b. Started on Zetia 09/2013.  Marland Kitchen Hypertension   . Ischemic cardiomyopathy    a. 09/2013: EF 30%, d/c with Lifevest. b.  s/p STJ dual chamber ICD implant 01/2014 by Dr Johney Frame  . Mitral regurgitation    a. Mild by echo 09/2013.  Marland Kitchen Protein calorie malnutrition (HCC)   . VT (ventricular tachycardia) (HCC)    a. On admission with NSTEMI 09/2013.    Assessment: 75 yoM w/ PMHx ICM and NSTEMI presents w/ chest tightness - to begin on IV heparin per pharmacy. Per patient he is not on any oral anticoagulants at home.  Goal of Therapy:  Heparin level 0.3-0.7 units/ml Monitor platelets by anticoagulation protocol: Yes   Plan:  -Heparin 3300 units x1 -Heparin 650 units/hr -Check 6-hr heparin level -Monitor  heparin level, CBC, S/Sx bleeding daily  Fredonia Highland, PharmD PGY-1 Pharmacy Resident Pager: [phone] 01/04/2017
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
## 2017-01-04 NOTE — ED Triage Notes (Signed)
PER EMS: pt from [facility] Urgent care with c/o substernal chest pressure lasting about 20 minutes. EKG at urgent care showed elevation in V1 and V2. Pt had four 81mg  tablets of aspirin prior to EMS arrival and EMS adm 2 SL nitro tabs. Pt now chest pain free, A&OX4. Has pacemaker/defibrillator in place.

## 2017-01-04 NOTE — ED Notes (Signed)
Portable Xray at bedside.
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
## 2017-01-05 ENCOUNTER — Encounter (HOSPITAL_COMMUNITY)
Admission: EM | Disposition: A | Discharge status: Home / Self Care | Payer: Self-pay | Source: Home / Self Care | Attending: Emergency Medicine

## 2017-01-05 ENCOUNTER — Telehealth: Payer: Self-pay

## 2017-01-05 ENCOUNTER — Encounter (HOSPITAL_COMMUNITY): Payer: Self-pay | Admitting: Nurse Practitioner

## 2017-01-05 DIAGNOSIS — I2510 Atherosclerotic heart disease of native coronary artery without angina pectoris: Secondary | ICD-10-CM | POA: Diagnosis not present

## 2017-01-05 DIAGNOSIS — I340 Nonrheumatic mitral (valve) insufficiency: Secondary | ICD-10-CM | POA: Diagnosis not present

## 2017-01-05 DIAGNOSIS — R69 Illness, unspecified: Secondary | ICD-10-CM | POA: Diagnosis not present

## 2017-01-05 DIAGNOSIS — I200 Unstable angina: Secondary | ICD-10-CM | POA: Diagnosis not present

## 2017-01-05 DIAGNOSIS — R071 Chest pain on breathing: Secondary | ICD-10-CM | POA: Diagnosis not present

## 2017-01-05 DIAGNOSIS — I252 Old myocardial infarction: Secondary | ICD-10-CM | POA: Diagnosis not present

## 2017-01-05 DIAGNOSIS — E785 Hyperlipidemia, unspecified: Secondary | ICD-10-CM | POA: Diagnosis not present

## 2017-01-05 DIAGNOSIS — I25110 Atherosclerotic heart disease of native coronary artery with unstable angina pectoris: Secondary | ICD-10-CM | POA: Diagnosis not present

## 2017-01-05 DIAGNOSIS — M19011 Primary osteoarthritis, right shoulder: Secondary | ICD-10-CM | POA: Diagnosis not present

## 2017-01-05 DIAGNOSIS — I249 Acute ischemic heart disease, unspecified: Secondary | ICD-10-CM | POA: Diagnosis not present

## 2017-01-05 DIAGNOSIS — I5022 Chronic systolic (congestive) heart failure: Secondary | ICD-10-CM | POA: Diagnosis not present

## 2017-01-05 DIAGNOSIS — I255 Ischemic cardiomyopathy: Secondary | ICD-10-CM | POA: Diagnosis not present

## 2017-01-05 DIAGNOSIS — Z95810 Presence of automatic (implantable) cardiac defibrillator: Secondary | ICD-10-CM | POA: Diagnosis not present

## 2017-01-05 DIAGNOSIS — I10 Essential (primary) hypertension: Secondary | ICD-10-CM | POA: Diagnosis not present

## 2017-01-05 HISTORY — PX: LEFT HEART CATH AND CORONARY ANGIOGRAPHY: CATH118249

## 2017-01-05 LAB — LIPID PANEL
Cholesterol: 229 mg/dL — ABNORMAL HIGH (ref 0–200)
HDL: 27 mg/dL — ABNORMAL LOW (ref >40)
LDL CALC: 166 mg/dL — AB (ref 0–99)
TRIGLYCERIDES: 178 mg/dL — AB (ref <150)
Total CHOL/HDL Ratio: 8.5 RATIO
VLDL: 36 mg/dL (ref 0–40)

## 2017-01-05 LAB — CBC
HCT: 46.7 % (ref 39.0–52.0)
Hemoglobin: 15.6 g/dL (ref 13.0–17.0)
MCH: 31.4 pg (ref 26.0–34.0)
MCHC: 33.4 g/dL (ref 30.0–36.0)
MCV: 94 fL (ref 78.0–100.0)
PLATELETS: 184 10*3/uL (ref 150–400)
RBC: 4.97 MIL/uL (ref 4.22–5.81)
RDW: 13.9 % (ref 11.5–15.5)
WBC: 10.6 10*3/uL — AB (ref 4.0–10.5)

## 2017-01-05 LAB — TROPONIN I: Troponin I: 0.03 ng/mL (ref <0.03)

## 2017-01-05 LAB — BASIC METABOLIC PANEL
ANION GAP: 8 (ref 5–15)
BUN: 9 mg/dL (ref 6–20)
CHLORIDE: 103 mmol/L (ref 101–111)
CO2: 28 mmol/L (ref 22–32)
Calcium: 9.1 mg/dL (ref 8.9–10.3)
Creatinine, Ser: 0.81 mg/dL (ref 0.61–1.24)
GFR calc Af Amer: >60 mL/min (ref >60)
GLUCOSE: 114 mg/dL — AB (ref 65–99)
POTASSIUM: 3.8 mmol/L (ref 3.5–5.1)
SODIUM: 139 mmol/L (ref 135–145)

## 2017-01-05 LAB — HEPARIN LEVEL (UNFRACTIONATED)
HEPARIN UNFRACTIONATED: 0.2 [IU]/mL — AB (ref 0.30–0.70)
Heparin Unfractionated: 0.25 IU/mL — ABNORMAL LOW (ref 0.30–0.70)

## 2017-01-05 LAB — HEMOGLOBIN A1C
HEMOGLOBIN A1C: 5.8 % — AB (ref 4.8–5.6)
MEAN PLASMA GLUCOSE: 120 mg/dL

## 2017-01-05 SURGERY — LEFT HEART CATH AND CORONARY ANGIOGRAPHY
Anesthesia: LOCAL

## 2017-01-05 MED ORDER — MIDAZOLAM HCL 2 MG/2ML IJ SOLN
INTRAMUSCULAR | Status: DC | PRN
  Administered 2017-01-05: 1 mg via INTRAVENOUS

## 2017-01-05 MED ORDER — SODIUM CHLORIDE 0.9% FLUSH: 3.0000 mL | INTRAVENOUS | Status: DC | PRN

## 2017-01-05 MED ORDER — VERAPAMIL HCL 2.5 MG/ML IV SOLN
INTRAVENOUS | Status: AC
  Filled 2017-01-05: qty 2

## 2017-01-05 MED ORDER — SODIUM CHLORIDE 0.9 % IV SOLN
INTRAVENOUS | Status: AC
  Administered 2017-01-05: 16:00:00 via INTRAVENOUS

## 2017-01-05 MED ORDER — ONDANSETRON HCL 4 MG/2ML IJ SOLN: 4.0000 mg | Freq: Four times a day (QID) | INTRAMUSCULAR | Status: DC | PRN

## 2017-01-05 MED ORDER — MIDAZOLAM HCL 2 MG/2ML IJ SOLN
INTRAMUSCULAR | Status: AC
  Filled 2017-01-05: qty 2

## 2017-01-05 MED ORDER — HEPARIN SODIUM (PORCINE) 1000 UNIT/ML IJ SOLN
INTRAMUSCULAR | Status: DC | PRN
  Administered 2017-01-05: 3000 [IU] via INTRAVENOUS

## 2017-01-05 MED ORDER — SODIUM CHLORIDE 0.9% FLUSH: 3.0000 mL | Freq: Two times a day (BID) | INTRAVENOUS | Status: DC

## 2017-01-05 MED ORDER — VERAPAMIL HCL 2.5 MG/ML IV SOLN
INTRA_ARTERIAL | Status: DC | PRN
  Administered 2017-01-05: 15:00:00 via INTRA_ARTERIAL

## 2017-01-05 MED ORDER — LIDOCAINE HCL (PF) 1 % IJ SOLN
INTRAMUSCULAR | Status: AC
  Filled 2017-01-05: qty 30

## 2017-01-05 MED ORDER — NITROGLYCERIN 1 MG/10 ML FOR IR/CATH LAB
INTRA_ARTERIAL | Status: AC
  Filled 2017-01-05: qty 10

## 2017-01-05 MED ORDER — ACETAMINOPHEN 325 MG PO TABS: 650.0000 mg | ORAL_TABLET | ORAL | Status: DC | PRN

## 2017-01-05 MED ORDER — FENTANYL CITRATE (PF) 100 MCG/2ML IJ SOLN
INTRAMUSCULAR | Status: DC | PRN
  Administered 2017-01-05: 25 ug via INTRAVENOUS

## 2017-01-05 MED ORDER — HEPARIN (PORCINE) IN NACL 2-0.9 UNIT/ML-% IJ SOLN
INTRAMUSCULAR | Status: AC
  Filled 2017-01-05: qty 1000

## 2017-01-05 MED ORDER — IOPAMIDOL (ISOVUE-370) INJECTION 76%
INTRAVENOUS | Status: DC | PRN
  Administered 2017-01-05: 40 mL via INTRA_ARTERIAL

## 2017-01-05 MED ORDER — SODIUM CHLORIDE 0.9 % IV SOLN: 250.0000 mL | INTRAVENOUS | Status: DC | PRN

## 2017-01-05 MED ORDER — LIDOCAINE HCL (PF) 1 % IJ SOLN
INTRAMUSCULAR | Status: DC | PRN
  Administered 2017-01-05: 5 mL

## 2017-01-05 MED ORDER — FENTANYL CITRATE (PF) 100 MCG/2ML IJ SOLN
INTRAMUSCULAR | Status: AC
  Filled 2017-01-05: qty 2

## 2017-01-05 MED ORDER — IOPAMIDOL (ISOVUE-370) INJECTION 76%
INTRAVENOUS | Status: AC
  Filled 2017-01-05: qty 100

## 2017-01-05 MED ORDER — ASPIRIN 81 MG PO CHEW: 81.0000 mg | CHEWABLE_TABLET | Freq: Every day | ORAL | Status: DC

## 2017-01-05 MED ORDER — HEPARIN (PORCINE) IN NACL 2-0.9 UNIT/ML-% IJ SOLN
INTRAMUSCULAR | Status: DC | PRN
  Administered 2017-01-05: 1000 mL

## 2017-01-05 MED ORDER — HEPARIN SODIUM (PORCINE) 1000 UNIT/ML IJ SOLN
INTRAMUSCULAR | Status: AC
  Filled 2017-01-05: qty 1

## 2017-01-05 SURGICAL SUPPLY — 11 items
CATH EXPO 5FR ANG PIGTAIL 145 (CATHETERS) ×1 IMPLANT
CATH EXPO 5FR FR4 (CATHETERS) ×1 IMPLANT
DEVICE RAD COMP TR BAND LRG (VASCULAR PRODUCTS) ×1 IMPLANT
GLIDESHEATH SLEND A-KIT 6F 22G (SHEATH) ×1 IMPLANT
GUIDEWIRE INQWIRE 1.5J.035X260 (WIRE) IMPLANT
INQWIRE 1.5J .035X260CM (WIRE) ×2
KIT HEART LEFT (KITS) ×2 IMPLANT
PACK CARDIAC CATHETERIZATION (CUSTOM PROCEDURE TRAY) ×2 IMPLANT
TRANSDUCER W/STOPCOCK (MISCELLANEOUS) ×2 IMPLANT
TUBING CIL FLEX 10 FLL-RA (TUBING) ×2 IMPLANT
WIRE HI TORQ VERSACORE-J 145CM (WIRE) ×1 IMPLANT

## 2017-01-05 NOTE — Discharge Instructions (Signed)
**  PLEASE REMEMBER TO BRING ALL OF YOUR MEDICATIONS TO EACH OF YOUR FOLLOW-UP OFFICE VISITS. ° °Radial Site Care °Refer to this sheet in the next few weeks. These instructions provide you with information on caring for yourself after your procedure. Your caregiver may also give you more specific instructions. Your treatment has been planned according to current medical practices, but problems sometimes occur. Call your caregiver if you have any problems or questions after your procedure. °HOME CARE INSTRUCTIONS °· You may shower the day after the procedure. Remove the bandage (dressing) and gently wash the site with plain soap and water. Gently pat the site dry.  °· Do not apply powder or lotion to the site.  °· Do not submerge the affected site in water for 3 to 5 days.  °· Inspect the site at least twice daily.  °· Do not flex or bend the affected arm for 24 hours.  °· No lifting over 5 pounds (2.3 kg) for 5 days after your procedure.  °· Do not drive home if you are discharged the same day of the procedure. Have someone else drive you.  °· You may drive 24 hours after the procedure unless otherwise instructed by your caregiver.  °What to expect: °· Any bruising will usually fade within 1 to 2 weeks.  °· Blood that collects in the tissue (hematoma) may be painful to the touch. It should usually decrease in size and tenderness within 1 to 2 weeks.  °SEEK IMMEDIATE MEDICAL CARE IF: °· You have unusual pain at the radial site.  °· You have redness, warmth, swelling, or pain at the radial site.  °· You have drainage (other than a small amount of blood on the dressing).  °· You have chills.  °· You have a fever or persistent symptoms for more than 72 hours.  °· You have a fever and your symptoms suddenly get worse.  °· Your arm becomes pale, cool, tingly, or numb.  °· You have heavy bleeding from the site. Hold pressure on the site.  °   ° °10 Habits of Highly Healthy People ° °[place] wants to help you get well and  stay well.  Live a longer, healthier life by practicing healthy habits every day. ° °1.  Visit your primary care provider regularly. °2.  Make time for family and friends.  Healthy relationships are important. °3.  Take medications as directed by your provider. °4.  Maintain a healthy weight and a trim waistline. °5.  Eat healthy meals and snacks, rich in fruits, vegetables, whole grains, and lean proteins. °6.  Get moving every day - aim for 150 minutes of moderate physical activity each week. °7.  Don't smoke. °8.  Avoid alcohol or drink in moderation. °9.  Manage stress through meditation or mindful relaxation. °10.  Get seven to nine hours of quality sleep each night. ° °Want more information on healthy habits?  To learn more about these and other healthy habits, visit [email or web address]. °_____________ °  °  ° °

## 2017-01-05 NOTE — Telephone Encounter (Signed)
Returned patient call and left message that was returning his call from 01/04/2017 while I was out of the office. ICM remote transmission rescheduled from 01/06/2017 to 01/10/2017 due to patient is in the hospital.

## 2017-01-05 NOTE — Progress Notes (Signed)
ANTICOAGULATION CONSULT NOTE - Follow Up Consult  Pharmacy Consult for Heparin  Indication: chest pain/ACS  Allergies  Allergen Reactions  . Morphine And Related     Extreme agitation (pulls out IVs)  . Codeine Other (See Comments)    Patient "is not himself", gets aggitated  . Statins     Causes muscle fatigue   Patient Measurements: Height: 5\' 6"  (167.6 cm) Weight: 131 lb (59.4 kg) IBW/kg (Calculated) : 63.8  Vital Signs: Temp: 98.6 F (37 C) (02/28 2022) Temp Source: Oral (02/28 2022) BP: 155/81 (02/28 2022) Pulse Rate: 52 (02/28 2022)  Labs:  Recent Labs  01/04/17 1830 01/04/17 2037 01/05/17 0026  HGB 15.8  --   --   HCT 47.1  --   --   PLT 210  --   --   LABPROT  --  14.7  --   INR  --  1.15  --   HEPARINUNFRC  --   --  0.20*  CREATININE 0.90  --   --   TROPONINI  --  0.03*  --     Estimated Creatinine Clearance: 68.8 mL/min (by C-G formula based on SCr of 0.9 mg/dL).  Assessment: 66 y/o M on heparin for CP, likely cath today, heparin level is low, no issues per RN.   Goal of Therapy:  Heparin level 0.3-0.7 units/ml Monitor platelets by anticoagulation protocol: Yes   Plan:  -Inc heparin to 750 units/hr -1000 HL  Jarielys Girardot 01/05/2017,1:35 AM

## 2017-01-05 NOTE — Progress Notes (Addendum)
Pt discharged home with daughter.  Discharge instructions reviewed with patient/daughter and both verbalized understanding.  R radial C/D/I with palpable pulse, VSS.

## 2017-01-05 NOTE — Progress Notes (Addendum)
Progress Note  Patient Name: Gary [name] Date of Encounter: 01/05/2017  Primary Cardiologist: Tonny Bollman, M.D.  Subjective   Brief chest discomfort leading to admission. First chest discomfort since 2014. Story is somewhat vague. No recurrence of discomfort overnight.  Inpatient Medications    Scheduled Meds: . ALPRAZolam  0.5 mg Oral TID  . aspirin EC  81 mg Oral Daily  . carvedilol  25 mg Oral BID WC  . feeding supplement  1 Container Oral Q24H  . isosorbide mononitrate  30 mg Oral Daily  . losartan  50 mg Oral QHS  . senna  1 tablet Oral QODAY  . sodium chloride flush  3 mL Intravenous Q12H  . sodium chloride flush  3 mL Intravenous Q12H   Continuous Infusions: . sodium chloride 10 mL/hr (01/05/17 0658)  . heparin 750 Units/hr (01/05/17 0151)   PRN Meds: sodium chloride, sodium chloride, acetaminophen, acetaminophen, ALPRAZolam, nitroGLYCERIN, ondansetron (ZOFRAN) IV, sodium chloride flush, sodium chloride flush   Vital Signs    Vitals:   01/04/17 1959 01/04/17 2022 01/05/17 0347 01/05/17 0848  BP: 172/92 (!) 155/81 (!) 144/77 (!) 169/85  Pulse: (!) 58 (!) 52 66 74  Resp: 17 18 18 16   Temp: 98.1 F (36.7 C) 98.6 F (37 C) 98.2 F (36.8 C) 98.9 F (37.2 C)  TempSrc: Oral Oral Oral Oral  SpO2: 95% 95% 94% 93%  Weight:  131 lb (59.4 kg)    Height:  5\' 6"  (1.676 m)      Intake/Output Summary (Last 24 hours) at 01/05/17 0914 Last data filed at 01/05/17 0348  Gross per 24 hour  Intake                0 ml  Output              300 ml  Net             -300 ml   Filed Weights   01/04/17 1820 01/04/17 2022  Weight: 125 lb (56.7 kg) 131 lb (59.4 kg)    Telemetry    Normal sinus rhythm - Personally Reviewed  ECG    01/05/17 ECG has changed compared to admission now revealing right bundle with left anterior hemiblock. No ischemic changes are noted. - Personally Reviewed  Physical Exam  Comfortable GEN: No acute distress.   Neck: No JVD Cardiac: RRR,  no murmurs, rubs, or gallops.  Respiratory: Clear to auscultation bilaterally. GI: Soft, nontender, non-distended  MS: No edema; No deformity. Neuro:  Nonfocal  Psych: Normal affect   Labs    Chemistry Recent Labs Lab 01/04/17 1830 01/05/17 0723  NA 136 139  K 4.3 3.8  CL 100* 103  CO2 26 28  GLUCOSE 142* 114*  BUN 16 9  CREATININE 0.90 0.81  CALCIUM 9.4 9.1  GFRNONAA >60 >60  GFRAA >60 >60  ANIONGAP 10 8     Hematology Recent Labs Lab 01/04/17 1830 01/05/17 0723  WBC 11.0* 10.6*  RBC 4.97 4.97  HGB 15.8 15.6  HCT 47.1 46.7  MCV 94.8 94.0  MCH 31.8 31.4  MCHC 33.5 33.4  RDW 13.7 13.9  PLT 210 184    Cardiac Enzymes Recent Labs Lab 01/04/17 2037 01/05/17 0026 01/05/17 0723  TROPONINI 0.03* 0.03* <0.03    Recent Labs Lab 01/04/17 1840  TROPIPOC 0.01     BNP Recent Labs Lab 01/04/17 2037  BNP 50.4     DDimer No results for input(s): DDIMER in the last  168 hours.   Radiology    Dg Chest Port 1 View  Result Date: 01/04/2017 CLINICAL DATA:  Substernal chest pain EXAM: PORTABLE CHEST 1 VIEW COMPARISON:  02/21/2014 FINDINGS: Heart is top-normal in size. Aortic atherosclerosis without aneurysm. The patient is status post low anterior cervical disc fusion. Lungs are clear. AICD device projects over the left hemithorax with right atrial pacer and right ventricular defibrillator leads in place. IMPRESSION: 1. No active disease. 2. AICD device in place. 3. ACDF of the lower cervical spine. Electronically Signed   By: Tollie Eth M.D.   On: 01/04/2017 18:50    Cardiac Studies   No new data  Patient Profile     66 y.o. male with mixed ischemic/nonischemic cardiomyopathy, LVEF 30% 2017, chronic systolic heart failure, probable COPD, and very little coronary disease by cath in 2014 when he underwent angioplasty of an ostial diagonal. Other arteries were widely patent. Diagonal PCI was complex and could not be stented. Presents now with vague chest  discomfort with trivial troponin I elevation.  Assessment & Plan    1. Suspect acute coronary syndrome/non-ST elevation myocardial infarction. Coronary angiography is planned. Only prior disease was ostial diagonal which was angioplastied and left with chronic dissection in 2014. Stenting was not possible. Other vessels were widely patent. If he is again having difficulty with the diagonal, may be reasonable to consider continued medical treatment rather than PCI. 2. Abnormal EKG with new development of right bundle branch block and left anterior hemiblock pattern. 3. Anxiety disorder 4. Essential hypertension, currently with poor control. Continue carvedilol/Imdur/losartan. If control remains poor, consider adding low dose HCTZ to the medics. Signed, Lesleigh Noe, MD  01/05/2017, 9:14 AM

## 2017-01-05 NOTE — H&P (View-Only) (Signed)
Progress Note  Patient Name: Gary [name] Date of Encounter: 01/05/2017  Primary Cardiologist: Tonny Bollman, M.D.  Subjective   Brief chest discomfort leading to admission. First chest discomfort since 2014. Story is somewhat vague. No recurrence of discomfort overnight.  Inpatient Medications    Scheduled Meds: . ALPRAZolam  0.5 mg Oral TID  . aspirin EC  81 mg Oral Daily  . carvedilol  25 mg Oral BID WC  . feeding supplement  1 Container Oral Q24H  . isosorbide mononitrate  30 mg Oral Daily  . losartan  50 mg Oral QHS  . senna  1 tablet Oral QODAY  . sodium chloride flush  3 mL Intravenous Q12H  . sodium chloride flush  3 mL Intravenous Q12H   Continuous Infusions: . sodium chloride 10 mL/hr (01/05/17 0658)  . heparin 750 Units/hr (01/05/17 0151)   PRN Meds: sodium chloride, sodium chloride, acetaminophen, acetaminophen, ALPRAZolam, nitroGLYCERIN, ondansetron (ZOFRAN) IV, sodium chloride flush, sodium chloride flush   Vital Signs    Vitals:   01/04/17 1959 01/04/17 2022 01/05/17 0347 01/05/17 0848  BP: 172/92 (!) 155/81 (!) 144/77 (!) 169/85  Pulse: (!) 58 (!) 52 66 74  Resp: 17 18 18 16   Temp: 98.1 F (36.7 C) 98.6 F (37 C) 98.2 F (36.8 C) 98.9 F (37.2 C)  TempSrc: Oral Oral Oral Oral  SpO2: 95% 95% 94% 93%  Weight:  131 lb (59.4 kg)    Height:  5\' 6"  (1.676 m)      Intake/Output Summary (Last 24 hours) at 01/05/17 0914 Last data filed at 01/05/17 0348  Gross per 24 hour  Intake                0 ml  Output              300 ml  Net             -300 ml   Filed Weights   01/04/17 1820 01/04/17 2022  Weight: 125 lb (56.7 kg) 131 lb (59.4 kg)    Telemetry    Normal sinus rhythm - Personally Reviewed  ECG    01/05/17 ECG has changed compared to admission now revealing right bundle with left anterior hemiblock. No ischemic changes are noted. - Personally Reviewed  Physical Exam  Comfortable GEN: No acute distress.   Neck: No JVD Cardiac: RRR,  no murmurs, rubs, or gallops.  Respiratory: Clear to auscultation bilaterally. GI: Soft, nontender, non-distended  MS: No edema; No deformity. Neuro:  Nonfocal  Psych: Normal affect   Labs    Chemistry Recent Labs Lab 01/04/17 1830 01/05/17 0723  NA 136 139  K 4.3 3.8  CL 100* 103  CO2 26 28  GLUCOSE 142* 114*  BUN 16 9  CREATININE 0.90 0.81  CALCIUM 9.4 9.1  GFRNONAA >60 >60  GFRAA >60 >60  ANIONGAP 10 8     Hematology Recent Labs Lab 01/04/17 1830 01/05/17 0723  WBC 11.0* 10.6*  RBC 4.97 4.97  HGB 15.8 15.6  HCT 47.1 46.7  MCV 94.8 94.0  MCH 31.8 31.4  MCHC 33.5 33.4  RDW 13.7 13.9  PLT 210 184    Cardiac Enzymes Recent Labs Lab 01/04/17 2037 01/05/17 0026 01/05/17 0723  TROPONINI 0.03* 0.03* <0.03    Recent Labs Lab 01/04/17 1840  TROPIPOC 0.01     BNP Recent Labs Lab 01/04/17 2037  BNP 50.4     DDimer No results for input(s): DDIMER in the last  168 hours.   Radiology    Dg Chest Port 1 View  Result Date: 01/04/2017 CLINICAL DATA:  Substernal chest pain EXAM: PORTABLE CHEST 1 VIEW COMPARISON:  02/21/2014 FINDINGS: Heart is top-normal in size. Aortic atherosclerosis without aneurysm. The patient is status post low anterior cervical disc fusion. Lungs are clear. AICD device projects over the left hemithorax with right atrial pacer and right ventricular defibrillator leads in place. IMPRESSION: 1. No active disease. 2. AICD device in place. 3. ACDF of the lower cervical spine. Electronically Signed   By: Tollie Eth M.D.   On: 01/04/2017 18:50    Cardiac Studies   No new data  Patient Profile     66 y.o. male with mixed ischemic/nonischemic cardiomyopathy, LVEF 30% 2017, chronic systolic heart failure, probable COPD, and very little coronary disease by cath in 2014 when he underwent angioplasty of an ostial diagonal. Other arteries were widely patent. Diagonal PCI was complex and could not be stented. Presents now with vague chest  discomfort with trivial troponin I elevation.  Assessment & Plan    1. Suspect acute coronary syndrome/non-ST elevation myocardial infarction. Coronary angiography is planned. Only prior disease was ostial diagonal which was angioplastied and left with chronic dissection in 2014. Stenting was not possible. Other vessels were widely patent. If he is again having difficulty with the diagonal, may be reasonable to consider continued medical treatment rather than PCI. 2. Abnormal EKG with new development of right bundle branch block and left anterior hemiblock pattern. 3. Anxiety disorder 4. Essential hypertension, currently with poor control. Continue carvedilol/Imdur/losartan. If control remains poor, consider adding low dose HCTZ to the medics. Signed, Lesleigh Noe, MD  01/05/2017, 9:14 AM

## 2017-01-05 NOTE — Discharge Summary (Signed)
The patient has been seen in conjunction with Eustaquio Boyden, NP-C. All aspects of care have been considered and discussed. The patient has been personally interviewed, examined, and all clinical data has been reviewed.   Noncardiac chest pain.  Previously stented ostial diagonal is widely patent. No significant obstructive disease is noted.  Plan no further cardiac workup and discharge from the hospital.    Discharge Summary    Patient ID: Gary [name],  MRN: [id number], DOB/AGE: 1951/03/07 66 y.o.  Admit date: 01/04/2017 Discharge date: 01/05/2017  Primary Care Provider: Samuel Jester Primary Cardiologist: Judie Petit. Excell Seltzer, MD   Discharge Diagnoses    Principal Problem:   Chest pain  **s/p catheterization this admission revealing non-obstructive CAD.  Active Problems:   Cardiomyopathy, ischemic- EF 30%   CAD- Dx DES Nov 2014 (STEMI)   Ventricular tachycardia (HCC)   Hypercholesterolemia   Essential hypertension- Meds adjusted 4/15  Allergies Allergies  Allergen Reactions  . Morphine And Related     Extreme agitation (pulls out IVs)  . Codeine Other (See Comments)    Patient "is not himself", gets aggitated  . Statins     Causes muscle fatigue    Diagnostic Studies/Procedures    Cardiac Catheterization 3.1.2018  IMPRESSION: Gary [name] has nonobstructive CAD. He has 40% near ostial left main and 40% smooth proximal LAD both of which do not appear to be physiologically significant. Epigastric chest pain is most likely noncardiac. The sheath was removed and a TR band was placed on the right wrist to achieve patent hemostasis. The patient left the lab in stable condition. He'll be discharged home later today and follow-up as an outpatient.  _____________   History of Present Illness     66 y/o ? with a h/o CAD, ICM, chronic systolic CHF, HTN, HL, VT, and tobacco abuse, who was in his usual state of health until 2/28, when he was driving and developed substernal chest pain.   He eventually presented to an urgent care where ECG was concerning for ST elevation, though was not acutely different than prior ECGs.  He was transferred to [facility] where initial troponin was normal.  He was admitted for further evaluation.  Hospital Course     Consultants: None   Patient ruled out for MI.  He Underwent diagnostic catheterization on March 1, revealing mild, nonobstructive left main and LAD disease. Medical therapy was recommended. Post catheterization, he has been doing without difficulty and will be discharged home today in good condition. _____________  Discharge Vitals Blood pressure 98/61, pulse 75, temperature 99.1 F (37.3 C), temperature source Oral, resp. rate 18, height 5\' 6"  (1.676 m), weight 131 lb (59.4 kg), SpO2 100 %.  Filed Weights   01/04/17 1820 01/04/17 2022  Weight: 125 lb (56.7 kg) 131 lb (59.4 kg)    Labs & Radiologic Studies    CBC  Recent Labs  01/04/17 1830 01/05/17 0723  WBC 11.0* 10.6*  NEUTROABS 7.0  --   HGB 15.8 15.6  HCT 47.1 46.7  MCV 94.8 94.0  PLT 210 184   Basic Metabolic Panel  Recent Labs  01/04/17 1830 01/05/17 0723  NA 136 139  K 4.3 3.8  CL 100* 103  CO2 26 28  GLUCOSE 142* 114*  BUN 16 9  CREATININE 0.90 0.81  CALCIUM 9.4 9.1   Cardiac Enzymes  Recent Labs  01/04/17 2037 01/05/17 0026 01/05/17 0723  TROPONINI 0.03* 0.03* <0.03   Hemoglobin A1C  Recent Labs  01/04/17 2037  HGBA1C 5.8*   Fasting Lipid Panel  Recent Labs  01/05/17 0026  CHOL 229*  HDL 27*  LDLCALC 166*  TRIG 178*  CHOLHDL 8.5   Thyroid Function Tests  Recent Labs  01/04/17 2037  TSH 1.153   _____________  Dg Chest Port 1 View  Result Date: 01/04/2017 CLINICAL DATA:  Substernal chest pain EXAM: PORTABLE CHEST 1 VIEW COMPARISON:  02/21/2014 FINDINGS: Heart is top-normal in size. Aortic atherosclerosis without aneurysm. The patient is status post low anterior cervical disc fusion. Lungs are clear. AICD device  projects over the left hemithorax with right atrial pacer and right ventricular defibrillator leads in place. IMPRESSION: 1. No active disease. 2. AICD device in place. 3. ACDF of the lower cervical spine. Electronically Signed   By: Tollie Eth M.D.   On: 01/04/2017 18:50   Disposition   Pt is being discharged home today in good condition.  Follow-up Plans & Appointments    Follow-up Information    [name], PA-C Follow up on 01/18/2017.   Specialties:  Cardiology, Physician Assistant Why:  2:45 PM - Dr. Earmon Phoenix PA Contact information: [address] [place] [address] [phone]            Discharge Medications   Current Discharge Medication List    CONTINUE these medications which have NOT CHANGED   Details  acetaminophen (TYLENOL) 500 MG tablet Take 500 mg by mouth every 6 (six) hours as needed for moderate pain.    ALPRAZolam (XANAX) 0.5 MG tablet Take 0.5 mg by mouth 3 (three) times daily. Refills: 0    aspirin EC 81 MG EC tablet Take 1 tablet (81 mg total) by mouth daily.    carvedilol (COREG) 25 MG tablet Take 1 tablet (25 mg total) by mouth 2 (two) times daily with a meal. Qty: 180 tablet, Refills: 3    feeding supplement (BOOST HIGH PROTEIN) LIQD Take 1 Container by mouth daily.    isosorbide mononitrate (IMDUR) 30 MG 24 hr tablet Take 1 tablet (30 mg total) by mouth daily. Qty: 30 tablet, Refills: 0    losartan (COZAAR) 50 MG tablet Take 1 tablet (50 mg total) by mouth at bedtime. Qty: 90 tablet, Refills: 3    mirtazapine (REMERON) 15 MG tablet Take 15 mg by mouth at bedtime.    nitroGLYCERIN (NITROSTAT) 0.4 MG SL tablet Place 1 tablet (0.4 mg total) under the tongue every 5 (five) minutes as needed for chest pain (up to 3 doses). Qty: 25 tablet, Refills: 3    senna (SENOKOT) 8.6 MG TABS tablet Take 1 tablet by mouth every other day.          Outstanding Labs/Studies   None  Duration of Discharge Encounter   Greater  than 30 minutes including physician time.  SignedNicolasa Ducking NP 01/05/2017, 8:02 PM

## 2017-01-05 NOTE — Care Management Obs Status (Signed)
MEDICARE OBSERVATION STATUS NOTIFICATION   Patient Details  Name: Gary [name] MRN: [id number] Date of Birth: 06/09/1951   Medicare Observation Status Notification Given:  Yes    Lawerance Sabal, RN 01/05/2017, 11:33 AM

## 2017-01-05 NOTE — Interval H&P Note (Signed)
Cath Lab Visit (complete for each Cath Lab visit)  Clinical Evaluation Leading to the Procedure:   ACS: Yes.    Non-ACS:    Anginal Classification: CCS III  Anti-ischemic medical therapy: Minimal Therapy (1 class of medications)  Non-Invasive Test Results: No non-invasive testing performed  Prior CABG: No previous CABG      History and Physical Interval Note:  01/05/2017 2:28 PM  Gary [name]  has presented today for surgery, with the diagnosis of unstable angina  The various methods of treatment have been discussed with the patient and family. After consideration of risks, benefits and other options for treatment, the patient has consented to  Procedure(s): Left Heart Cath and Coronary Angiography (N/A) as a surgical intervention .  The patient's history has been reviewed, patient examined, no change in status, stable for surgery.  I have reviewed the patient's chart and labs.  Questions were answered to the patient's satisfaction.     Nanetta Batty

## 2017-01-06 ENCOUNTER — Encounter (HOSPITAL_COMMUNITY): Payer: Self-pay | Admitting: Cardiovascular Disease

## 2017-01-06 ENCOUNTER — Ambulatory Visit (INDEPENDENT_AMBULATORY_CARE_PROVIDER_SITE_OTHER): Payer: Medicare HMO

## 2017-01-06 DIAGNOSIS — Z95810 Presence of automatic (implantable) cardiac defibrillator: Secondary | ICD-10-CM | POA: Diagnosis not present

## 2017-01-06 DIAGNOSIS — I5022 Chronic systolic (congestive) heart failure: Secondary | ICD-10-CM

## 2017-01-06 NOTE — Progress Notes (Signed)
EPIC Encounter for ICM Monitoring  Patient Name: Gary [name] is a 66 y.o. male Date: 01/06/2017 Primary Care Physican: Samuel Jester, DO Primary Cardiologist:[place] Electrophysiologist: Allred Dry Weight:124 lbs         Heart Failure questions reviewed, pt asymptomatic.  Had ER visit for chest pain, heart attack r/o and cardiac cath was negative for any significant blockages.     Thoracic impedance normal   Does not take diuretic  Recommendations: No changes. Reminded to limit dietary salt intake to 2000 mg/day and fluid intake to < 2 liters/day. Encouraged to call for fluid symptoms.  Follow-up plan: ICM clinic phone appointment on 02/06/2017.  Copy of ICM check sent to device physician.   3 month ICM trend: 01/06/2017   1 Year ICM trend:      Karie Soda, RN 01/06/2017 8:35 AM

## 2017-01-18 ENCOUNTER — Ambulatory Visit (INDEPENDENT_AMBULATORY_CARE_PROVIDER_SITE_OTHER): Payer: Medicare HMO | Admitting: Physician Assistant

## 2017-01-18 ENCOUNTER — Encounter: Payer: Self-pay | Admitting: Physician Assistant

## 2017-01-18 ENCOUNTER — Encounter (INDEPENDENT_AMBULATORY_CARE_PROVIDER_SITE_OTHER): Payer: Self-pay

## 2017-01-18 VITALS — BP 162/80 | HR 79 | Ht 66.0 in | Wt 139.8 lb

## 2017-01-18 DIAGNOSIS — I2510 Atherosclerotic heart disease of native coronary artery without angina pectoris: Secondary | ICD-10-CM

## 2017-01-18 DIAGNOSIS — E7800 Pure hypercholesterolemia, unspecified: Secondary | ICD-10-CM

## 2017-01-18 DIAGNOSIS — Z95810 Presence of automatic (implantable) cardiac defibrillator: Secondary | ICD-10-CM | POA: Diagnosis not present

## 2017-01-18 DIAGNOSIS — I5022 Chronic systolic (congestive) heart failure: Secondary | ICD-10-CM

## 2017-01-18 DIAGNOSIS — I10 Essential (primary) hypertension: Secondary | ICD-10-CM | POA: Diagnosis not present

## 2017-01-18 NOTE — Patient Instructions (Addendum)
Medication Instructions:  Your physician recommends that you continue on your current medications as directed. Please refer to the Current Medication list given to you today.  Labwork: NONE  Testing/Procedures: NONE  Follow-Up: Your physician wants you to follow-up in: 6 MONTHS WITH DR. Excell Seltzer. You will receive a reminder letter in the mail two months in advance. If you don't receive a letter, please call our office to schedule the follow-up appointment.  Any Other Special Instructions Will Be Listed Below (If Applicable). MONITOR BLOOD PRESSURE 2-3 TIMES A WEEK AND CALL IF BP IS CONSISTENTLY RUNNING 140/90 OR HIGHER AFTER 2-3 WEEKS ; PLEASE CALL THE OFFICE  If you need a refill on your cardiac medications before your next appointment, please call your pharmacy.
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
## 2017-01-31 DIAGNOSIS — I2510 Atherosclerotic heart disease of native coronary artery without angina pectoris: Secondary | ICD-10-CM | POA: Diagnosis not present

## 2017-01-31 DIAGNOSIS — Z6822 Body mass index (BMI) 22.0-22.9, adult: Secondary | ICD-10-CM | POA: Diagnosis not present

## 2017-01-31 DIAGNOSIS — R69 Illness, unspecified: Secondary | ICD-10-CM | POA: Diagnosis not present

## 2017-01-31 DIAGNOSIS — F17210 Nicotine dependence, cigarettes, uncomplicated: Secondary | ICD-10-CM | POA: Diagnosis not present

## 2017-01-31 DIAGNOSIS — F419 Anxiety disorder, unspecified: Secondary | ICD-10-CM | POA: Diagnosis not present

## 2017-01-31 DIAGNOSIS — I10 Essential (primary) hypertension: Secondary | ICD-10-CM | POA: Diagnosis not present

## 2017-02-06 ENCOUNTER — Telehealth: Payer: Self-pay

## 2017-02-06 ENCOUNTER — Ambulatory Visit (INDEPENDENT_AMBULATORY_CARE_PROVIDER_SITE_OTHER): Payer: Medicare HMO

## 2017-02-06 DIAGNOSIS — I5022 Chronic systolic (congestive) heart failure: Secondary | ICD-10-CM

## 2017-02-06 DIAGNOSIS — Z95810 Presence of automatic (implantable) cardiac defibrillator: Secondary | ICD-10-CM

## 2017-02-06 NOTE — Progress Notes (Signed)
EPIC Encounter for ICM Monitoring  Patient Name: Gary [name] is a 66 y.o. male Date: 02/06/2017 Primary Care Physican: Samuel Jester, DO Primary Cardiologist:Cooper Electrophysiologist: Allred Dry Weight: Last known [id number] lbs      Attempted call to patient and unable to reach.  Left detailed message regarding transmission.  Transmission reviewed.    Thoracic impedance normal.  Does not take diuretic  Recommendations: Left voice mail with ICM number and encouraged to call for fluid symptoms.  Follow-up plan: ICM clinic phone appointment on 03/09/2017.    Copy of ICM check sent to device physician.   3 month ICM trend: 02/06/2017   1 Year ICM trend:      Karie Soda, RN 02/06/2017 11:59 AM

## 2017-02-06 NOTE — Telephone Encounter (Signed)
Remote ICM transmission received.  Attempted patient call and left detailed message regarding transmission and next ICM scheduled for 03/09/2017.  Advised to return call for any fluid symptoms or questions.    

## 2017-03-09 ENCOUNTER — Telehealth: Payer: Self-pay

## 2017-03-09 ENCOUNTER — Ambulatory Visit (INDEPENDENT_AMBULATORY_CARE_PROVIDER_SITE_OTHER): Payer: Medicare HMO | Admitting: *Deleted

## 2017-03-09 DIAGNOSIS — I5022 Chronic systolic (congestive) heart failure: Secondary | ICD-10-CM

## 2017-03-09 DIAGNOSIS — Z95810 Presence of automatic (implantable) cardiac defibrillator: Secondary | ICD-10-CM | POA: Diagnosis not present

## 2017-03-09 DIAGNOSIS — I255 Ischemic cardiomyopathy: Secondary | ICD-10-CM | POA: Diagnosis not present

## 2017-03-09 NOTE — Telephone Encounter (Signed)
Remote ICM transmission received.  Attempted patient call and left detailed message regarding transmission and next ICM scheduled for 04/07/2017.  Advised to return call for any fluid symptoms or questions.    

## 2017-03-09 NOTE — Progress Notes (Signed)
EPIC Encounter for ICM Monitoring  Patient Name: Gary [name] is a 65 y.o. male Date: 03/09/2017 Primary Care Physican: Samuel Jester, DO Primary Cardiologist:Cooper Electrophysiologist: Allred Dry Weight: Last known [id number] lbs      Attempted call to patient and unable to reach.  Left detailed message regarding transmission.  Transmission reviewed. .   Thoracic impedance normal.  Does not take diuretic  Recommendations: Left voice mail with ICM number and encouraged to call for fluid symptoms.  Follow-up plan: ICM clinic phone appointment on 04/07/2017.    Copy of ICM check sent to device physician.   3 month ICM trend: 03/07/2017   1 Year ICM trend:      Karie Soda, RN 03/09/2017 2:23 PM

## 2017-03-09 NOTE — Progress Notes (Signed)
Remote ICD transmission.   

## 2017-03-10 LAB — CUP PACEART REMOTE DEVICE CHECK
Battery Remaining Longevity: 68 mo
Battery Remaining Percentage: 68 %
Battery Voltage: 2.96 V
Brady Statistic AP VS Percent: 1 %
Brady Statistic AS VP Percent: 1 %
Brady Statistic AS VS Percent: 99 %
HighPow Impedance: 72 Ohm
HighPow Impedance: 72 Ohm
Implantable Lead Implant Date: 20150313
Implantable Lead Location: 753859
Implantable Lead Location: 753860
Lead Channel Impedance Value: 390 Ohm
Lead Channel Pacing Threshold Amplitude: 0.75 V
Lead Channel Pacing Threshold Pulse Width: 0.5 ms
Lead Channel Sensing Intrinsic Amplitude: 11.8 mV
Lead Channel Sensing Intrinsic Amplitude: 3.8 mV
Lead Channel Setting Pacing Amplitude: 2 V
Lead Channel Setting Pacing Pulse Width: 0.5 ms
Lead Channel Setting Sensing Sensitivity: 0.5 mV
MDC IDC LEAD IMPLANT DT: 20150313
MDC IDC MSMT LEADCHNL RV IMPEDANCE VALUE: 480 Ohm
MDC IDC MSMT LEADCHNL RV PACING THRESHOLD AMPLITUDE: 1 V
MDC IDC MSMT LEADCHNL RV PACING THRESHOLD PULSEWIDTH: 0.5 ms
MDC IDC PG IMPLANT DT: 20150313
MDC IDC SESS DTM: 20180503060018
MDC IDC SET LEADCHNL RV PACING AMPLITUDE: 2.5 V
MDC IDC STAT BRADY AP VP PERCENT: 1 %
MDC IDC STAT BRADY RA PERCENT PACED: 1 %
MDC IDC STAT BRADY RV PERCENT PACED: 1 %
Pulse Gen Serial Number: 7142670

## 2017-03-17 ENCOUNTER — Encounter: Payer: Self-pay | Admitting: Cardiology

## 2017-04-07 ENCOUNTER — Telehealth: Payer: Self-pay

## 2017-04-07 ENCOUNTER — Ambulatory Visit (INDEPENDENT_AMBULATORY_CARE_PROVIDER_SITE_OTHER): Payer: Medicare HMO

## 2017-04-07 DIAGNOSIS — Z95810 Presence of automatic (implantable) cardiac defibrillator: Secondary | ICD-10-CM

## 2017-04-07 DIAGNOSIS — I5022 Chronic systolic (congestive) heart failure: Secondary | ICD-10-CM

## 2017-04-07 NOTE — Progress Notes (Signed)
Returned patient's call.  He stated he is doing well.  Reviewed transmission and he did not know what may have caused fluid accumulation in May but never had any fluid symptoms.  Next transmission scheduled for 05/08/2017 and encouraged him to call for any fluid symptoms.

## 2017-04-07 NOTE — Telephone Encounter (Signed)
Remote ICM transmission received.  Attempted patient call and no answering machine  

## 2017-04-07 NOTE — Progress Notes (Signed)
EPIC Encounter for ICM Monitoring  Patient Name: [name] is a 66 y.o. male Date: 04/07/2017 Primary Care Physican: Samuel Jester, DO Primary Cardiologist:Cooper Electrophysiologist: Allred Dry Weight:124 lbs      Attempted call to patient and unable to reach.    Transmission reviewed.    Thoracic impedance normal but was abnormal suggesting fluid accumulation from 03/13/2017 to 03/25/2017.  No diuretic  Recommendations: NONE - Unable to reach patient   Follow-up plan: ICM clinic phone appointment on 05/08/2017.    Copy of ICM check sent to device physician.   3 month ICM trend: 04/07/2017   1 Year ICM trend:      Karie Soda, RN 04/07/2017 8:42 AM

## 2017-05-03 DIAGNOSIS — M5030 Other cervical disc degeneration, unspecified cervical region: Secondary | ICD-10-CM | POA: Diagnosis not present

## 2017-05-03 DIAGNOSIS — R69 Illness, unspecified: Secondary | ICD-10-CM | POA: Diagnosis not present

## 2017-05-03 DIAGNOSIS — E782 Mixed hyperlipidemia: Secondary | ICD-10-CM | POA: Diagnosis not present

## 2017-05-03 DIAGNOSIS — I2510 Atherosclerotic heart disease of native coronary artery without angina pectoris: Secondary | ICD-10-CM | POA: Diagnosis not present

## 2017-05-03 DIAGNOSIS — Z6821 Body mass index (BMI) 21.0-21.9, adult: Secondary | ICD-10-CM | POA: Diagnosis not present

## 2017-05-03 DIAGNOSIS — I10 Essential (primary) hypertension: Secondary | ICD-10-CM | POA: Diagnosis not present

## 2017-05-03 DIAGNOSIS — Z550 Illiteracy and low-level literacy: Secondary | ICD-10-CM | POA: Diagnosis not present

## 2017-05-08 ENCOUNTER — Ambulatory Visit (INDEPENDENT_AMBULATORY_CARE_PROVIDER_SITE_OTHER): Payer: Medicare HMO

## 2017-05-08 ENCOUNTER — Telehealth: Payer: Self-pay

## 2017-05-08 DIAGNOSIS — I5022 Chronic systolic (congestive) heart failure: Secondary | ICD-10-CM

## 2017-05-08 DIAGNOSIS — Z95810 Presence of automatic (implantable) cardiac defibrillator: Secondary | ICD-10-CM

## 2017-05-08 NOTE — Telephone Encounter (Signed)
Remote ICM transmission received.  Attempted patient call and left detailed message regarding transmission and next ICM scheduled for 06/08/2017.  Advised to return call for any fluid symptoms or questions.    

## 2017-05-08 NOTE — Progress Notes (Signed)
EPIC Encounter for ICM Monitoring  Patient Name: Gary [name] is a 66 y.o. male Date: 05/08/2017 Primary Care Physican: Samuel Jester, DO Primary Cardiologist:Cooper Electrophysiologist: Allred Dry Weight:Last weight 124 lbs                                                      Attempted call to patient and unable to reach.  Left detailed message regarding transmission.  Transmission reviewed.    Thoracic impedance normal.  No diuretic  Recommendations: Left voice mail with ICM number and encouraged to call for fluid symptoms.  Advised to call office to make a routine follow up appointment with Dr Gary [name].  Follow-up plan: ICM clinic phone appointment on 06/08/2017.    Copy of ICM check sent to device physician.   3 month ICM trend: 05/08/2017   1 Year ICM trend:      Karie Soda, RN 05/08/2017 8:22 AM

## 2017-05-09 DIAGNOSIS — R69 Illness, unspecified: Secondary | ICD-10-CM | POA: Diagnosis not present

## 2017-05-09 DIAGNOSIS — E782 Mixed hyperlipidemia: Secondary | ICD-10-CM | POA: Diagnosis not present

## 2017-05-09 DIAGNOSIS — M5030 Other cervical disc degeneration, unspecified cervical region: Secondary | ICD-10-CM | POA: Diagnosis not present

## 2017-05-09 DIAGNOSIS — R5382 Chronic fatigue, unspecified: Secondary | ICD-10-CM | POA: Diagnosis not present

## 2017-05-09 DIAGNOSIS — I2510 Atherosclerotic heart disease of native coronary artery without angina pectoris: Secondary | ICD-10-CM | POA: Diagnosis not present

## 2017-05-09 DIAGNOSIS — Z550 Illiteracy and low-level literacy: Secondary | ICD-10-CM | POA: Diagnosis not present

## 2017-05-09 DIAGNOSIS — Z125 Encounter for screening for malignant neoplasm of prostate: Secondary | ICD-10-CM | POA: Diagnosis not present

## 2017-05-09 DIAGNOSIS — F17210 Nicotine dependence, cigarettes, uncomplicated: Secondary | ICD-10-CM | POA: Diagnosis not present

## 2017-05-09 DIAGNOSIS — I10 Essential (primary) hypertension: Secondary | ICD-10-CM | POA: Diagnosis not present

## 2017-06-07 DIAGNOSIS — R361 Hematospermia: Secondary | ICD-10-CM | POA: Diagnosis not present

## 2017-06-08 ENCOUNTER — Ambulatory Visit (INDEPENDENT_AMBULATORY_CARE_PROVIDER_SITE_OTHER): Payer: Medicare HMO | Admitting: *Deleted

## 2017-06-08 DIAGNOSIS — I5022 Chronic systolic (congestive) heart failure: Secondary | ICD-10-CM

## 2017-06-08 DIAGNOSIS — I255 Ischemic cardiomyopathy: Secondary | ICD-10-CM | POA: Diagnosis not present

## 2017-06-08 DIAGNOSIS — Z95810 Presence of automatic (implantable) cardiac defibrillator: Secondary | ICD-10-CM | POA: Diagnosis not present

## 2017-06-08 NOTE — Progress Notes (Signed)
Remote ICD transmission.   

## 2017-06-08 NOTE — Progress Notes (Signed)
EPIC Encounter for ICM Monitoring  Patient Name: Gary [name] is a 66 y.o. male Date: 06/08/2017 Primary Care Physican: Samuel Jester, DO Primary Cardiologist:Cooper Electrophysiologist: Allred Dry Weight:126 lbs      Heart Failure questions reviewed, pt asymptomatic.   Thoracic impedance normal.  No diuretic  Recommendations: No changes.  Advised to limit salt intake to 2000 mg/day and fluid intake to < 2 liters/day.  Encouraged to call for fluid symptoms.  Follow-up plan: ICM clinic phone appointment on 07/11/2017.  Office appointment scheduled 07/31/2017 with Dr. Johney Frame and 09/01/2017 with Dr Excell Seltzer.   Copy of ICM check sent to device physician.   3 month ICM trend: 06/08/2017   1 Year ICM trend:      Karie Soda, RN 06/08/2017 2:24 PM

## 2017-06-09 ENCOUNTER — Encounter: Payer: Self-pay | Admitting: Cardiology

## 2017-07-11 ENCOUNTER — Ambulatory Visit (INDEPENDENT_AMBULATORY_CARE_PROVIDER_SITE_OTHER): Payer: Medicare HMO

## 2017-07-11 ENCOUNTER — Telehealth: Payer: Self-pay

## 2017-07-11 DIAGNOSIS — I5022 Chronic systolic (congestive) heart failure: Secondary | ICD-10-CM | POA: Diagnosis not present

## 2017-07-11 DIAGNOSIS — Z95810 Presence of automatic (implantable) cardiac defibrillator: Secondary | ICD-10-CM | POA: Diagnosis not present

## 2017-07-11 NOTE — Progress Notes (Signed)
EPIC Encounter for ICM Monitoring  Patient Name: Gary [name] is a 66 y.o. male Date: 07/11/2017 Primary Care Physican: Samuel Jester, DO Primary Cardiologist:Cooper Electrophysiologist: Allred Dry Weight:Last weight 124 lbs      Attempted call to patient and unable to reach.  Left detailed message regarding transmission.  Transmission reviewed.    Thoracic impedance normal.  No diuretic  Recommendations: Left voice mail with ICM number and encouraged to call for fluid symptoms.  Follow-up plan: ICM clinic phone appointment on 09/07/2017.  Office appointment scheduled 07/31/2017 with Dr. Johney Frame and 09/01/2017 with Dr Excell Seltzer.   Copy of ICM check sent to Dr. Johney Frame.   3 month ICM trend: 07/11/2017   1 Year ICM trend:      Karie Soda, RN 07/11/2017 10:21 AM

## 2017-07-11 NOTE — Telephone Encounter (Signed)
Remote ICM transmission received.  Attempted call to patient and left detailed message regarding transmission and next ICM scheduled for 09/07/2017 and will have office defib check with Dr Johney Frame on 07/31/2017.  Advised to return call for any fluid symptoms or questions.

## 2017-07-17 LAB — CUP PACEART REMOTE DEVICE CHECK
Battery Remaining Longevity: 66 mo
Brady Statistic AP VP Percent: 1 %
Brady Statistic AP VS Percent: 1 %
Brady Statistic RA Percent Paced: 1 %
Brady Statistic RV Percent Paced: 1 %
HighPow Impedance: 72 Ohm
HighPow Impedance: 72 Ohm
Implantable Lead Implant Date: 20150313
Implantable Lead Implant Date: 20150313
Implantable Lead Location: 753859
Lead Channel Impedance Value: 390 Ohm
Lead Channel Pacing Threshold Amplitude: 0.75 V
Lead Channel Pacing Threshold Amplitude: 1 V
Lead Channel Pacing Threshold Pulse Width: 0.5 ms
Lead Channel Sensing Intrinsic Amplitude: 3.7 mV
Lead Channel Setting Pacing Amplitude: 2.5 V
Lead Channel Setting Pacing Pulse Width: 0.5 ms
MDC IDC LEAD LOCATION: 753860
MDC IDC MSMT BATTERY REMAINING PERCENTAGE: 66 %
MDC IDC MSMT BATTERY VOLTAGE: 2.96 V
MDC IDC MSMT LEADCHNL RA PACING THRESHOLD PULSEWIDTH: 0.5 ms
MDC IDC MSMT LEADCHNL RV IMPEDANCE VALUE: 410 Ohm
MDC IDC MSMT LEADCHNL RV SENSING INTR AMPL: 11.8 mV
MDC IDC PG IMPLANT DT: 20150313
MDC IDC PG SERIAL: 7142670
MDC IDC SESS DTM: 20180802060015
MDC IDC SET LEADCHNL RA PACING AMPLITUDE: 2 V
MDC IDC SET LEADCHNL RV SENSING SENSITIVITY: 0.5 mV
MDC IDC STAT BRADY AS VP PERCENT: 1 %
MDC IDC STAT BRADY AS VS PERCENT: 99 %

## 2017-07-31 ENCOUNTER — Encounter: Payer: Self-pay | Admitting: Internal Medicine

## 2017-07-31 ENCOUNTER — Ambulatory Visit (INDEPENDENT_AMBULATORY_CARE_PROVIDER_SITE_OTHER): Payer: Medicare HMO | Admitting: Internal Medicine

## 2017-07-31 ENCOUNTER — Telehealth: Payer: Self-pay

## 2017-07-31 VITALS — BP 146/74 | HR 64 | Ht 66.0 in | Wt 138.0 lb

## 2017-07-31 DIAGNOSIS — I480 Paroxysmal atrial fibrillation: Secondary | ICD-10-CM | POA: Diagnosis not present

## 2017-07-31 DIAGNOSIS — I472 Ventricular tachycardia, unspecified: Secondary | ICD-10-CM

## 2017-07-31 DIAGNOSIS — I5022 Chronic systolic (congestive) heart failure: Secondary | ICD-10-CM

## 2017-07-31 DIAGNOSIS — I255 Ischemic cardiomyopathy: Secondary | ICD-10-CM | POA: Diagnosis not present

## 2017-07-31 LAB — CUP PACEART INCLINIC DEVICE CHECK
Battery Remaining Longevity: 67 mo
HIGH POWER IMPEDANCE MEASURED VALUE: 76.5 Ohm
Implantable Lead Location: 753859
Implantable Pulse Generator Implant Date: 20150313
Lead Channel Impedance Value: 437.5 Ohm
Lead Channel Pacing Threshold Amplitude: 0.75 V
Lead Channel Pacing Threshold Pulse Width: 0.5 ms
Lead Channel Sensing Intrinsic Amplitude: 11.8 mV
Lead Channel Setting Pacing Amplitude: 2.5 V
Lead Channel Setting Pacing Pulse Width: 0.5 ms
Lead Channel Setting Sensing Sensitivity: 0.5 mV
MDC IDC LEAD IMPLANT DT: 20150313
MDC IDC LEAD IMPLANT DT: 20150313
MDC IDC LEAD LOCATION: 753860
MDC IDC MSMT LEADCHNL RA IMPEDANCE VALUE: 412.5 Ohm
MDC IDC MSMT LEADCHNL RA PACING THRESHOLD AMPLITUDE: 0.75 V
MDC IDC MSMT LEADCHNL RA PACING THRESHOLD AMPLITUDE: 0.75 V
MDC IDC MSMT LEADCHNL RA PACING THRESHOLD PULSEWIDTH: 0.5 ms
MDC IDC MSMT LEADCHNL RA PACING THRESHOLD PULSEWIDTH: 0.5 ms
MDC IDC MSMT LEADCHNL RA SENSING INTR AMPL: 3.5 mV
MDC IDC MSMT LEADCHNL RV PACING THRESHOLD AMPLITUDE: 0.75 V
MDC IDC MSMT LEADCHNL RV PACING THRESHOLD PULSEWIDTH: 0.5 ms
MDC IDC SESS DTM: 20180924112537
MDC IDC SET LEADCHNL RA PACING AMPLITUDE: 2 V
MDC IDC STAT BRADY RA PERCENT PACED: 0.24 %
MDC IDC STAT BRADY RV PERCENT PACED: 0.06 %
Pulse Gen Serial Number: 7142670
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
## 2017-07-31 NOTE — Telephone Encounter (Signed)
Call to patient and advised ICM remote transmission will be changed from 09/07/2017 to 09/11/2017.  He agreed with new date.

## 2017-07-31 NOTE — Patient Instructions (Signed)
Medication Instructions:  Your physician recommends that you continue on your current medications as directed. Please refer to the Current Medication list given to you today.   Labwork: None ordered   Testing/Procedures: None ordered   Follow-Up:  Remote monitoring is used to monitor your ICD from home. This monitoring reduces the number of office visits required to check your device to one time per year. It allows [place] to keep an eye on the functioning of your device to ensure it is working properly. You are scheduled for a device check from home on 09/07/2017. You may send your transmission at any time that day. If you have a wireless device, the transmission will be sent automatically. After your physician reviews your transmission, you will receive a postcard with your next transmission date.   Your physician wants you to follow-up in: 12 months with Gypsy Balsam, NP. You will receive a reminder letter in the mail two months in advance. If you don't receive a letter, please call our office to schedule the follow-up appointment.     Any Other Special Instructions Will Be Listed Below (If Applicable).     If you need a refill on your cardiac medications before your next appointment, please call your pharmacy.

## 2017-08-03 DIAGNOSIS — I2510 Atherosclerotic heart disease of native coronary artery without angina pectoris: Secondary | ICD-10-CM | POA: Diagnosis not present

## 2017-08-03 DIAGNOSIS — M5030 Other cervical disc degeneration, unspecified cervical region: Secondary | ICD-10-CM | POA: Diagnosis not present

## 2017-08-03 DIAGNOSIS — E782 Mixed hyperlipidemia: Secondary | ICD-10-CM | POA: Diagnosis not present

## 2017-08-03 DIAGNOSIS — Z6821 Body mass index (BMI) 21.0-21.9, adult: Secondary | ICD-10-CM | POA: Diagnosis not present

## 2017-08-03 DIAGNOSIS — I10 Essential (primary) hypertension: Secondary | ICD-10-CM | POA: Diagnosis not present

## 2017-08-03 DIAGNOSIS — Z550 Illiteracy and low-level literacy: Secondary | ICD-10-CM | POA: Diagnosis not present

## 2017-08-03 DIAGNOSIS — R69 Illness, unspecified: Secondary | ICD-10-CM | POA: Diagnosis not present

## 2017-09-01 ENCOUNTER — Encounter: Payer: Self-pay | Admitting: Cardiovascular Disease

## 2017-09-01 ENCOUNTER — Ambulatory Visit (INDEPENDENT_AMBULATORY_CARE_PROVIDER_SITE_OTHER): Payer: Medicare HMO | Admitting: Cardiovascular Disease

## 2017-09-01 VITALS — BP 186/104 | HR 59 | Ht 66.0 in | Wt 140.4 lb

## 2017-09-01 DIAGNOSIS — I5022 Chronic systolic (congestive) heart failure: Secondary | ICD-10-CM

## 2017-09-01 DIAGNOSIS — I2510 Atherosclerotic heart disease of native coronary artery without angina pectoris: Secondary | ICD-10-CM

## 2017-09-01 DIAGNOSIS — E7800 Pure hypercholesterolemia, unspecified: Secondary | ICD-10-CM | POA: Diagnosis not present

## 2017-09-01 DIAGNOSIS — I10 Essential (primary) hypertension: Secondary | ICD-10-CM

## 2017-09-01 MED ORDER — LOSARTAN POTASSIUM 100 MG PO TABS: 100.0000 mg | ORAL_TABLET | Freq: Every day | ORAL | 3 refills | Status: DC

## 2017-09-01 MED ORDER — LOSARTAN POTASSIUM 50 MG PO TABS: 50.0000 mg | ORAL_TABLET | Freq: Every day | ORAL | 3 refills | Status: DC
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
## 2017-09-01 NOTE — Patient Instructions (Signed)
Medication Instructions:  1) INCREASE LOSARTAN to 100 mg daily  Labwork: None  Testing/Procedures: None  Follow-Up: Your provider wants you to follow-up in: 6 months with [name], PA. You will receive a reminder letter in the mail two months in advance. If you don't receive a letter, please call our office to schedule the follow-up appointment.    Any Other Special Instructions Will Be Listed Below (If Applicable).     If you need a refill on your cardiac medications before your next appointment, please call your pharmacy.

## 2017-09-07 ENCOUNTER — Telehealth: Payer: Self-pay | Admitting: Cardiovascular Disease

## 2017-09-07 NOTE — Telephone Encounter (Signed)
Patient complains that since he increased his Losartan to 100 mg nightly, he has had a burning sensation in his stomach. He has tried taking it with food and it doesn't help. He is taking 2 of the 50 mg he has been taking. He has no other complaints.  His BP (while on the phone with a wrist cuff) is 137/77. He requests further recommendations since he does not want to take the higher dose.   To Dr. Excell Seltzer.

## 2017-09-07 NOTE — Telephone Encounter (Signed)
New message    Patient daughter [name] calling, wants to know if patient can continue taking Losartan 50mg  instead of 100mg . Patient is having anxiety over medication change. Please call.  [name] phone # [phone]   Pt c/o medication issue:  1. Name of Medication: Losartan  2. How are you currently taking this medication (dosage and times per day)? n/a  3. Are you having a reaction (difficulty breathing--STAT)? n/a  4. What is your medication issue? Patient is having anxiety over medication change.

## 2017-09-09 NOTE — Telephone Encounter (Signed)
Would go back to the previous dose of 50 mg daily

## 2017-09-11 ENCOUNTER — Telehealth: Payer: Self-pay

## 2017-09-11 ENCOUNTER — Ambulatory Visit (INDEPENDENT_AMBULATORY_CARE_PROVIDER_SITE_OTHER): Payer: Medicare HMO | Admitting: *Deleted

## 2017-09-11 DIAGNOSIS — Z95810 Presence of automatic (implantable) cardiac defibrillator: Secondary | ICD-10-CM | POA: Diagnosis not present

## 2017-09-11 DIAGNOSIS — I5022 Chronic systolic (congestive) heart failure: Secondary | ICD-10-CM

## 2017-09-11 DIAGNOSIS — I255 Ischemic cardiomyopathy: Secondary | ICD-10-CM

## 2017-09-11 NOTE — Telephone Encounter (Signed)
Patient states his stomach is not bothered anymore and he will continue the bigger dose. He will continue to check BP daily and will call if BP is too high or low or if symptoms return. He was grateful for call.

## 2017-09-11 NOTE — Progress Notes (Signed)
EPIC Encounter for ICM Monitoring  Patient Name: Gary [name] is a 66 y.o. male Date: 09/11/2017 Primary Care Physican: Samuel Jester, DO Primary Cardiologist:Cooper Electrophysiologist: Allred Dry Weight:Last weight 124 lbs        Attempted call to patient and unable to reach.  Left detailed message regarding transmission.  Transmission reviewed.    Thoracic impedance normal.  No diuretic  Recommendations: Left voice mail with ICM number and encouraged to call if experiencing any fluid symptoms.  Follow-up plan: ICM clinic phone appointment on 10/12/2017.    Copy of ICM check sent to Dr. Johney Frame.   3 month ICM trend: 09/11/2017   1 Year ICM trend:      Karie Soda, RN 09/11/2017 9:41 AM

## 2017-09-11 NOTE — Telephone Encounter (Signed)
Remote ICM transmission received.  Attempted call to patient and left detailed message per DPR regarding transmission and next ICM scheduled for 10/12/2017.  Advised to return call for any fluid symptoms or questions.

## 2017-09-11 NOTE — Telephone Encounter (Signed)
Left message to call back  

## 2017-09-11 NOTE — Telephone Encounter (Signed)
Follow up ° ° ° °Pt is calling back for Gary [name]. °

## 2017-09-12 NOTE — Progress Notes (Signed)
Remote ICD transmission.   

## 2017-09-13 ENCOUNTER — Encounter: Payer: Self-pay | Admitting: Cardiology

## 2017-09-26 LAB — CUP PACEART REMOTE DEVICE CHECK
Battery Remaining Longevity: 64 mo
Battery Voltage: 2.95 V
Brady Statistic AS VP Percent: 1 %
Brady Statistic RA Percent Paced: 1 %
Date Time Interrogation Session: 20181105060016
HIGH POWER IMPEDANCE MEASURED VALUE: 74 Ohm
HighPow Impedance: 74 Ohm
Implantable Lead Implant Date: 20150313
Implantable Lead Location: 753859
Implantable Lead Location: 753860
Lead Channel Impedance Value: 480 Ohm
Lead Channel Pacing Threshold Amplitude: 0.75 V
Lead Channel Pacing Threshold Pulse Width: 0.5 ms
Lead Channel Sensing Intrinsic Amplitude: 9.1 mV
Lead Channel Setting Pacing Amplitude: 2 V
Lead Channel Setting Pacing Pulse Width: 0.5 ms
Lead Channel Setting Sensing Sensitivity: 0.5 mV
MDC IDC LEAD IMPLANT DT: 20150313
MDC IDC MSMT BATTERY REMAINING PERCENTAGE: 63 %
MDC IDC MSMT LEADCHNL RA IMPEDANCE VALUE: 410 Ohm
MDC IDC MSMT LEADCHNL RA PACING THRESHOLD AMPLITUDE: 0.75 V
MDC IDC MSMT LEADCHNL RA SENSING INTR AMPL: 4.3 mV
MDC IDC MSMT LEADCHNL RV PACING THRESHOLD PULSEWIDTH: 0.5 ms
MDC IDC PG IMPLANT DT: 20150313
MDC IDC SET LEADCHNL RV PACING AMPLITUDE: 2.5 V
MDC IDC STAT BRADY AP VP PERCENT: 1 %
MDC IDC STAT BRADY AP VS PERCENT: 1 %
MDC IDC STAT BRADY AS VS PERCENT: 99 %
MDC IDC STAT BRADY RV PERCENT PACED: 1 %
Pulse Gen Serial Number: 7142670

## 2017-10-09 DIAGNOSIS — Z550 Illiteracy and low-level literacy: Secondary | ICD-10-CM | POA: Diagnosis not present

## 2017-10-09 DIAGNOSIS — M5030 Other cervical disc degeneration, unspecified cervical region: Secondary | ICD-10-CM | POA: Diagnosis not present

## 2017-10-09 DIAGNOSIS — E782 Mixed hyperlipidemia: Secondary | ICD-10-CM | POA: Diagnosis not present

## 2017-10-09 DIAGNOSIS — Z6821 Body mass index (BMI) 21.0-21.9, adult: Secondary | ICD-10-CM | POA: Diagnosis not present

## 2017-10-09 DIAGNOSIS — I10 Essential (primary) hypertension: Secondary | ICD-10-CM | POA: Diagnosis not present

## 2017-10-09 DIAGNOSIS — R69 Illness, unspecified: Secondary | ICD-10-CM | POA: Diagnosis not present

## 2017-10-09 DIAGNOSIS — I2510 Atherosclerotic heart disease of native coronary artery without angina pectoris: Secondary | ICD-10-CM | POA: Diagnosis not present

## 2017-10-10 ENCOUNTER — Telehealth: Payer: Self-pay

## 2017-10-10 NOTE — Telephone Encounter (Signed)
Returned patient call as requested by voice mail.  He sent the remote transmission in today instead of 10/12/2017 as scheduled.  He is feeling fine and denied any fluid symptoms at this time. Weight is stable at 125-126 lbs.  Transmission reviewed and advised is normal at this time.  Encouraged to call for any fluid symptoms.  Next ICM remote transmission 11/14/2017

## 2017-10-12 ENCOUNTER — Ambulatory Visit (INDEPENDENT_AMBULATORY_CARE_PROVIDER_SITE_OTHER): Payer: Medicare HMO

## 2017-10-12 DIAGNOSIS — I5022 Chronic systolic (congestive) heart failure: Secondary | ICD-10-CM

## 2017-10-12 DIAGNOSIS — Z95810 Presence of automatic (implantable) cardiac defibrillator: Secondary | ICD-10-CM

## 2017-10-12 NOTE — Progress Notes (Signed)
EPIC Encounter for ICM Monitoring  Patient Name: Gary [name] is a 66 y.o. male Date: 10/12/2017 Primary Care Physican: Samuel Jester, DO Primary Cardiologist:Cooper Electrophysiologist: Allred Dry Weight:Last weight 124 lbs        Spoke with patient on 10-10-17 and he reported no fluid symptoms. He stated he is feeling fine.   Thoracic impedance normal.  No diuretic  Recommendations: No changes.    Follow-up plan: ICM clinic phone appointment on 11/16/2017.    Copy of ICM check sent to Dr. Johney Frame.   3 month ICM trend: 10/10/2017    1 Year ICM trend:       Karie Soda, RN 10/12/2017 2:02 PM

## 2017-10-28 ENCOUNTER — Other Ambulatory Visit: Payer: Self-pay | Admitting: Physician Assistant

## 2017-11-13 ENCOUNTER — Telehealth: Payer: Self-pay

## 2017-11-13 NOTE — Telephone Encounter (Signed)
ICM call to patient. Advised to send ICM remote transmission on 11/16/2017.  He said he was mixed up on the dates and sent today but will send new report on 11/16/2017 as scheduled.

## 2017-11-16 ENCOUNTER — Ambulatory Visit (INDEPENDENT_AMBULATORY_CARE_PROVIDER_SITE_OTHER): Payer: Medicare HMO

## 2017-11-16 DIAGNOSIS — M7551 Bursitis of right shoulder: Secondary | ICD-10-CM | POA: Diagnosis not present

## 2017-11-16 DIAGNOSIS — Z79899 Other long term (current) drug therapy: Secondary | ICD-10-CM | POA: Diagnosis not present

## 2017-11-16 DIAGNOSIS — I2510 Atherosclerotic heart disease of native coronary artery without angina pectoris: Secondary | ICD-10-CM | POA: Diagnosis not present

## 2017-11-16 DIAGNOSIS — E789 Disorder of lipoprotein metabolism, unspecified: Secondary | ICD-10-CM | POA: Diagnosis not present

## 2017-11-16 DIAGNOSIS — M7121 Synovial cyst of popliteal space [Baker], right knee: Secondary | ICD-10-CM | POA: Diagnosis not present

## 2017-11-16 DIAGNOSIS — R7309 Other abnormal glucose: Secondary | ICD-10-CM | POA: Diagnosis not present

## 2017-11-16 DIAGNOSIS — E782 Mixed hyperlipidemia: Secondary | ICD-10-CM | POA: Diagnosis not present

## 2017-11-16 DIAGNOSIS — Z95810 Presence of automatic (implantable) cardiac defibrillator: Secondary | ICD-10-CM | POA: Diagnosis not present

## 2017-11-16 DIAGNOSIS — I5022 Chronic systolic (congestive) heart failure: Secondary | ICD-10-CM

## 2017-11-16 NOTE — Progress Notes (Signed)
EPIC Encounter for ICM Monitoring  Patient Name: Gary [name] is a 67 y.o. male Date: 11/16/2017 Primary Care Physican: Samuel Jester, DO Primary Cardiologist:Cooper Electrophysiologist: Allred Dry Weight:125 lbs         Heart Failure questions reviewed, pt asymptomatic.   Thoracic impedance normal but was abnormal suggesting fluid accumulation from 11/08/17 to 11/13/17.  No diuretic  Recommendations: No changes.  Encouraged to call for fluid symptoms.  Follow-up plan: ICM clinic phone appointment on 12/18/2017.    Copy of ICM check sent to Dr. Johney Frame.   3 month ICM trend: 11/16/2017    1 Year ICM trend:       Karie Soda, RN 11/16/2017 2:12 PM

## 2017-12-05 DIAGNOSIS — R69 Illness, unspecified: Secondary | ICD-10-CM | POA: Diagnosis not present

## 2017-12-07 DIAGNOSIS — R69 Illness, unspecified: Secondary | ICD-10-CM | POA: Diagnosis not present

## 2017-12-18 ENCOUNTER — Ambulatory Visit (INDEPENDENT_AMBULATORY_CARE_PROVIDER_SITE_OTHER): Payer: Medicare HMO | Admitting: *Deleted

## 2017-12-18 DIAGNOSIS — I5022 Chronic systolic (congestive) heart failure: Secondary | ICD-10-CM

## 2017-12-18 DIAGNOSIS — I255 Ischemic cardiomyopathy: Secondary | ICD-10-CM

## 2017-12-18 DIAGNOSIS — Z95810 Presence of automatic (implantable) cardiac defibrillator: Secondary | ICD-10-CM

## 2017-12-18 NOTE — Progress Notes (Signed)
EPIC Encounter for ICM Monitoring  Patient Name: SERAFIM KIESEWETTER is a 67 y.o. male Date: 12/18/2017 Primary Care Physican: Samuel Jester, DO Primary Cardiologist:Cooper Electrophysiologist: Allred Dry Weight:126 lbs      Heart Failure questions reviewed, pt asymptomatic.   Thoracic impedance normal.  No diuretic  Recommendations: No changes.  Encouraged to call for fluid symptoms.  Follow-up plan: ICM clinic phone appointment on 01/18/2018.    Copy of ICM check sent to Dr. Johney Frame.   3 month ICM trend: 12/18/2017    1 Year ICM trend:       Karie Soda, RN 12/18/2017 10:41 AM

## 2017-12-19 ENCOUNTER — Encounter: Payer: Self-pay | Admitting: Cardiology

## 2017-12-19 NOTE — Progress Notes (Signed)
Opened in error

## 2017-12-20 ENCOUNTER — Encounter: Payer: Self-pay | Admitting: Cardiology

## 2017-12-26 LAB — CUP PACEART REMOTE DEVICE CHECK
Battery Remaining Longevity: 61 mo
Battery Remaining Percentage: 61 %
Battery Voltage: 2.95 V
Brady Statistic AP VP Percent: 1 %
Brady Statistic RA Percent Paced: 1 %
Brady Statistic RV Percent Paced: 1 %
Date Time Interrogation Session: 20190211070016
HIGH POWER IMPEDANCE MEASURED VALUE: 80 Ohm
HighPow Impedance: 80 Ohm
Implantable Lead Implant Date: 20150313
Implantable Lead Implant Date: 20150313
Implantable Lead Location: 753859
Implantable Pulse Generator Implant Date: 20150313
Lead Channel Pacing Threshold Amplitude: 0.75 V
Lead Channel Pacing Threshold Pulse Width: 0.5 ms
Lead Channel Sensing Intrinsic Amplitude: 5.7 mV
Lead Channel Setting Pacing Amplitude: 2 V
Lead Channel Setting Pacing Amplitude: 2.5 V
Lead Channel Setting Pacing Pulse Width: 0.5 ms
Lead Channel Setting Sensing Sensitivity: 0.5 mV
MDC IDC LEAD LOCATION: 753860
MDC IDC MSMT LEADCHNL RA IMPEDANCE VALUE: 410 Ohm
MDC IDC MSMT LEADCHNL RA PACING THRESHOLD AMPLITUDE: 0.75 V
MDC IDC MSMT LEADCHNL RA PACING THRESHOLD PULSEWIDTH: 0.5 ms
MDC IDC MSMT LEADCHNL RA SENSING INTR AMPL: 4.4 mV
MDC IDC MSMT LEADCHNL RV IMPEDANCE VALUE: 410 Ohm
MDC IDC STAT BRADY AP VS PERCENT: 1 %
MDC IDC STAT BRADY AS VP PERCENT: 1 %
MDC IDC STAT BRADY AS VS PERCENT: 99 %
Pulse Gen Serial Number: 7142670

## 2018-01-18 ENCOUNTER — Ambulatory Visit (INDEPENDENT_AMBULATORY_CARE_PROVIDER_SITE_OTHER): Payer: Medicare HMO

## 2018-01-18 ENCOUNTER — Telehealth: Payer: Self-pay

## 2018-01-18 DIAGNOSIS — I5022 Chronic systolic (congestive) heart failure: Secondary | ICD-10-CM | POA: Diagnosis not present

## 2018-01-18 DIAGNOSIS — Z95810 Presence of automatic (implantable) cardiac defibrillator: Secondary | ICD-10-CM

## 2018-01-18 NOTE — Telephone Encounter (Signed)
Remote ICM transmission received.  Attempted call to patient and left message to return call. 

## 2018-01-18 NOTE — Progress Notes (Signed)
EPIC Encounter for ICM Monitoring  Patient Name: Gary [name] is a 67 y.o. male Date: 01/18/2018 Primary Care Physican: Samuel Jester, DO Primary Cardiologist:Cooper Electrophysiologist: Allred Dry Weight:126lbs                 Heart Failure questions reviewed, pt asymptomatic.  He denied any symptoms during decreased impedance and stated he was eating at restaurants a lot.    Thoracic impedance normal but was abnormal suggesting fluid accumulation from 12/29/2017 through 01/13/2018.  No diuretic  Recommendations: No changes.  Advised to limit salt inake.  Encouraged to call for fluid symptoms.  Follow-up plan: ICM clinic phone appointment on 02/19/2018.    Copy of ICM check sent to Dr. Johney Frame.   3 month ICM trend: 01/18/2018    1 Year ICM trend:       Karie Soda, RN 01/18/2018 12:26 PM

## 2018-01-24 DIAGNOSIS — R69 Illness, unspecified: Secondary | ICD-10-CM | POA: Diagnosis not present

## 2018-02-14 DIAGNOSIS — E782 Mixed hyperlipidemia: Secondary | ICD-10-CM | POA: Diagnosis not present

## 2018-02-14 DIAGNOSIS — M5030 Other cervical disc degeneration, unspecified cervical region: Secondary | ICD-10-CM | POA: Diagnosis not present

## 2018-02-14 DIAGNOSIS — I10 Essential (primary) hypertension: Secondary | ICD-10-CM | POA: Diagnosis not present

## 2018-02-14 DIAGNOSIS — I2510 Atherosclerotic heart disease of native coronary artery without angina pectoris: Secondary | ICD-10-CM | POA: Diagnosis not present

## 2018-02-14 DIAGNOSIS — R300 Dysuria: Secondary | ICD-10-CM | POA: Diagnosis not present

## 2018-02-14 DIAGNOSIS — J301 Allergic rhinitis due to pollen: Secondary | ICD-10-CM | POA: Diagnosis not present

## 2018-02-14 DIAGNOSIS — R69 Illness, unspecified: Secondary | ICD-10-CM | POA: Diagnosis not present

## 2018-02-14 DIAGNOSIS — R739 Hyperglycemia, unspecified: Secondary | ICD-10-CM | POA: Diagnosis not present

## 2018-02-19 ENCOUNTER — Ambulatory Visit (INDEPENDENT_AMBULATORY_CARE_PROVIDER_SITE_OTHER): Payer: Medicare HMO

## 2018-02-19 DIAGNOSIS — I5022 Chronic systolic (congestive) heart failure: Secondary | ICD-10-CM | POA: Diagnosis not present

## 2018-02-19 DIAGNOSIS — Z95810 Presence of automatic (implantable) cardiac defibrillator: Secondary | ICD-10-CM

## 2018-02-19 NOTE — Progress Notes (Signed)
EPIC Encounter for ICM Monitoring  Patient Name: KALIM PICONE is a 67 y.o. male Date: 02/19/2018 Primary Care Physican: Samuel Jester, DO Primary Cardiologist:Cooper Electrophysiologist: Allred Dry Weight:124lbs       Heart Failure questions reviewed, pt asymptomatic.  Patient lives alone and eats breakfast out every morning and TV dinners at lunch and supper.  Discussed the foods he is eating are high in salt and to try to choose the ones with least amount of sodium.    Thoracic impedance abnormal suggesting fluid accumulation since 02/10/2018 and is close to baseline today.  No diuretic  Recommendations: No changes.  Reinforced sodium restriction.  Encouraged to call for fluid symptoms.  Follow-up plan: ICM clinic phone appointment on 03/19/2018.  Office appointment scheduled 03/19/2018 with [name], PA.  Copy of ICM check sent to Dr. Johney Frame and Dr. Excell Seltzer.   3 month ICM trend: 02/19/2018     Direct Trend Viewer   1 Year ICM trend:       Karie Soda, RN 02/19/2018 9:51 AM

## 2018-03-01 ENCOUNTER — Encounter: Payer: Self-pay | Admitting: Physician Assistant

## 2018-03-19 ENCOUNTER — Ambulatory Visit: Payer: Medicare HMO | Admitting: Physician Assistant

## 2018-03-19 ENCOUNTER — Encounter: Payer: Self-pay | Admitting: Physician Assistant

## 2018-03-19 ENCOUNTER — Encounter (INDEPENDENT_AMBULATORY_CARE_PROVIDER_SITE_OTHER): Payer: Self-pay

## 2018-03-19 ENCOUNTER — Ambulatory Visit (INDEPENDENT_AMBULATORY_CARE_PROVIDER_SITE_OTHER): Payer: Self-pay | Admitting: *Deleted

## 2018-03-19 VITALS — BP 142/86 | HR 71 | Ht 66.0 in | Wt 134.0 lb

## 2018-03-19 DIAGNOSIS — Z95810 Presence of automatic (implantable) cardiac defibrillator: Secondary | ICD-10-CM

## 2018-03-19 DIAGNOSIS — I480 Paroxysmal atrial fibrillation: Secondary | ICD-10-CM

## 2018-03-19 DIAGNOSIS — I5022 Chronic systolic (congestive) heart failure: Secondary | ICD-10-CM

## 2018-03-19 DIAGNOSIS — Z720 Tobacco use: Secondary | ICD-10-CM

## 2018-03-19 DIAGNOSIS — I10 Essential (primary) hypertension: Secondary | ICD-10-CM | POA: Diagnosis not present

## 2018-03-19 DIAGNOSIS — I255 Ischemic cardiomyopathy: Secondary | ICD-10-CM

## 2018-03-19 DIAGNOSIS — I2510 Atherosclerotic heart disease of native coronary artery without angina pectoris: Secondary | ICD-10-CM | POA: Diagnosis not present

## 2018-03-19 NOTE — Patient Instructions (Signed)
Medication Instructions:  1. Your physician recommends that you continue on your current medications as directed. Please refer to the Current Medication list given to you today.   Labwork: NONE ORDERED TODAY  Testing/Procedures: NONE ORDERED TODAY  Follow-Up: Your physician wants you to follow-up in: 08/2018 WITH DR. Theodoro Parma will receive a reminder letter in the mail two months in advance. If you don't receive a letter, please call our office to schedule the follow-up appointment.   Any Other Special Instructions Will Be Listed Below (If Applicable).     If you need a refill on your cardiac medications before your next appointment, please call your pharmacy.

## 2018-03-19 NOTE — Progress Notes (Signed)
Cardiology Office Note:    Date:  03/19/2018   ID:  Gary [name], DOB 11-02-1951, MRN [id number]  PCP:  Samuel Jester, DO  Cardiologist:  Tonny Bollman, MD  Electrophysiologist:  Dr. Hillis Range      Referring MD: Samuel Jester, DO   Chief Complaint  Patient presents with  . Follow-up    CAD, CHF    History of Present Illness:    Gary [name] is a 67 y.o. male with CAD status post non-STEMI in 2014 treated with POBA to the D1, ischemic cardiomyopathy, VT, status post AICD, HTN, HL, paroxysmal atrial fibrillation.    He has had episodes of atrial fibrillation noted in the past on device interrogation.  He saw Dr. Johney Frame in September 2018.  The patient declined anticoagulation at that time.  He was last seen by Dr. Excell Seltzer October 2018.  He is followed in the [facility] clinic.  Thoracic impedance recently was abnormal between 4/24 and 5/4 but has returned to normal.  [name] returns for follow up.  He is here with his daughter.  He is doing well.  He remains active.  He denies chest pain, shortness of breath, paroxysmal nocturnal dyspnea, edema, syncope.  He is still smoking.  Prior CV studies:   The following studies were reviewed today:  LHC 01/05/17 LM ostial 40 LAD ostial 40  Echo 09/19/16 EF 25-30, diffuse HK, grade 1 diastolic dysfunction, calcified aortic valve, trivial MR, trivial TR  Echo 02/19/14 EF 30-35%  LHC 12/14 LM: Okay LAD: Proximal 20%, D1 95%,  LCx okay RCA: Okay EF 35% PCI: POBA to the D1  Past Medical History:  Diagnosis Date  . Anxiety   . Arthritis    "right shoulder" (01/17/2014)  . Broken neck (HCC) 2000  . CAD (coronary artery disease)    a. NSTEMI 09/2013: - s/p balloon angioplasty of D1, c/b dissection but flow restored with prolonged balloon inflations;  b. 12/2016 Cath: LM 40ost, LAD 40p, LCX nl, RCA nl.  . Hyperlipidemia    a. intolerant to statins. b. Started on Zetia 09/2013.  Gary [name] Hypertension   . Ischemic cardiomyopathy    a.  09/2013: EF 30%, d/c with Lifevest. b.  s/p STJ dual chamber ICD implant 01/2014 by Dr Johney Frame  . Mitral regurgitation    a. Mild by echo 09/2013.  . Paroxysmal atrial fibrillation (HCC) 10/2016   chads2vasc score is 3.  . Protein calorie malnutrition (HCC)   . VT (ventricular tachycardia) (HCC)    a. On admission with NSTEMI 09/2013.   Surgical Hx: The patient  has a past surgical history that includes Implantable cardioverter defibrillator implant (01/17/2014); Cervical fusion (2000); Coronary angioplasty (09/2013); Lead revision (02/20/2014); left heart catheterization with coronary angiogram (N/A, 10/07/2013); implantable cardioverter defibrillator implant (N/A, 01/17/2014); Lead revision (N/A, 02/20/2014); and LEFT HEART CATH AND CORONARY ANGIOGRAPHY (N/A, 01/05/2017).   Current Medications: Current Meds  Medication Sig  . acetaminophen (TYLENOL) 500 MG tablet Take 500 mg by mouth every 6 (six) hours as needed for moderate pain.  Gary [name] ALPRAZolam (XANAX) 0.5 MG tablet Take 0.5 mg by mouth 3 (three) times daily.  Gary [name] aspirin EC 81 MG EC tablet Take 1 tablet (81 mg total) by mouth daily.  . carvedilol (COREG) 25 MG tablet TAKE  (1)  TABLET TWICE A DAY WITH MEALS (BREAKFAST AND SUPPER)  . feeding supplement (BOOST HIGH PROTEIN) LIQD Take 1 Container by mouth daily.  . isosorbide mononitrate (IMDUR) 30 MG 24 hr tablet Take  1 tablet (30 mg total) by mouth daily.  Gary [name] losartan (COZAAR) 100 MG tablet Take 1 tablet (100 mg total) by mouth at bedtime. (Patient taking differently: Take 50 mg by mouth at bedtime. )  . nitroGLYCERIN (NITROSTAT) 0.4 MG SL tablet Place 1 tablet (0.4 mg total) under the tongue every 5 (five) minutes as needed for chest pain (up to 3 doses).  . senna (SENOKOT) 8.6 MG TABS tablet Take 1 tablet by mouth every other day.     Allergies:   Morphine and related; Codeine; and Statins   Social History   Tobacco Use  . Smoking status: Former Smoker    Packs/day: 2.00    Years: 39.00     Pack years: 78.00    Types: Cigarettes    Last attempt to quit: 10/05/2013    Years since quitting: 4.4  . Smokeless tobacco: Former Neurosurgeon  . Tobacco comment: 01/17/2014 "chewed a little when I was young"  Substance Use Topics  . Alcohol use: No  . Drug use: No     Family Hx: The patient's family history includes Hypertension in his mother.  ROS:   Please see the history of present illness.    ROS All other systems reviewed and are negative.   EKGs/Labs/Other Test Reviewed:    EKG:  EKG is  ordered today.  The ekg ordered today demonstrates normal sinus rhythm, heart rate 73, LAD, QTC 407, similar to prior tracings  Recent Labs: No results found for requested labs within last 8760 hours.   Recent Lipid Panel Lab Results  Component Value Date/Time   CHOL 229 (H) 01/05/2017 12:26 AM   TRIG 178 (H) 01/05/2017 12:26 AM   HDL 27 (L) 01/05/2017 12:26 AM   CHOLHDL 8.5 01/05/2017 12:26 AM   LDLCALC 166 (H) 01/05/2017 12:26 AM   LDLDIRECT 117.0 06/05/2014 07:49 AM    Physical Exam:    VS:  BP (!) 142/86 (BP Location: Right Arm, Patient Position: Sitting, Cuff Size: Normal)   Pulse 71   Ht 5\' 6"  (1.676 m)   Wt 134 lb (60.8 kg)   SpO2 96%   BMI 21.63 kg/m     Wt Readings from Last 3 Encounters:  03/19/18 134 lb (60.8 kg)  09/01/17 140 lb 6.4 oz (63.7 kg)  07/31/17 138 lb (62.6 kg)     Physical Exam  Constitutional: He is oriented to person, place, and time. He appears well-developed and well-nourished. No distress.  HENT:  Head: Normocephalic and atraumatic.  Neck: Neck supple. No JVD present.  Cardiovascular: Normal rate, regular rhythm, S1 normal and S2 normal.  No murmur heard. Pulmonary/Chest: He has decreased breath sounds. He has no rales.  Abdominal: Soft. There is no hepatomegaly.  Musculoskeletal: He exhibits no edema.  Neurological: He is alert and oriented to person, place, and time.  Skin: Skin is warm and dry.    ASSESSMENT & PLAN:    Coronary  artery disease involving native coronary artery of native heart without angina pectoris  History of non-ST elevation MI in 2014 treated balloon angioplasty of the first diagonal.  Cardiac catheterization in 2018 with mild nonobstructive disease.  He is doing well without anginal symptoms.  Continue aspirin, beta-blocker, nitrates.  He is intolerant to statins.  He could not afford Zetia in the past and has not been interested in PCSK9 inhibitors.  Chronic systolic heart failure (HCC) EF 25-30 by echocardiogram in November 2017.  He is NYHA 1.  Continue beta-blocker, nitrates, ARB.  As he is NYHA Class I, I will not adjust his regimen further.  His volume is stable.  He is not on daily diuretics.  He did have recent evidence of volume excess on his device interrogation.  However, this returned to normal by limiting his salt intake.  Paroxysmal atrial fibrillation (HCC)  Recognized on previous device interrogation.  Dr. Johney Frame discussed anticoagulation with him at last visit, but he declined.    Essential hypertension Blood pressure typically goes up in clinic.  Blood pressures at home range from 116-120 systolic.  S/P ICD March 2015 Continue follow-up with EP as planned.  Tobacco abuse We discussed the importance of quitting smoking.   Dispo:  Return in about 6 months (around 09/19/2018) for Routine Follow Up, w/ Dr. Excell Seltzer.   Medication Adjustments/Labs and Tests Ordered: Current medicines are reviewed at length with the patient today.  Concerns regarding medicines are outlined above.  Tests Ordered: Orders Placed This Encounter  Procedures  . EKG 12-Lead   Medication Changes: No orders of the defined types were placed in this encounter.   Signed, [name], PA-C  03/19/2018 3:42 PM    [facility] [address] [place], [place], [place]  [address] Phone: [phone]; Fax: [phone]

## 2018-03-19 NOTE — Progress Notes (Signed)
EPIC Encounter for ICM Monitoring  Patient Name: Gary [name] is a 67 y.o. male Date: 03/19/2018 Primary Care Physican: Samuel Jester, DO Primary Cardiologist:Cooper/Weaver PA Electrophysiologist: Allred Dry Weight:124lbs      Heart Failure questions reviewed, pt asymptomatic. Patient lives alone and eats at restaurants a lot or eats frozen dinners.  Discussed restaurant foods are normally high in salt.    Thoracic impedance normal but was abnormal suggesting fluid accumulation from 02/28/2018 - 03/10/2018.  No diuretic  Recommendations: No changes.  Reinforced sodium restriction to less than 2000 mg daily.  Encouraged to call for fluid symptoms.  Follow-up plan: ICM clinic phone appointment on 04/19/2018.  Office appointment scheduled 03/19/2018 with [name], PA.  Copy of ICM check sent to Dr. Excell Seltzer, Dr Johney Frame and [name], PA since patient has an appointment with him today.   3 month ICM trend: 03/19/2018    1 Year ICM trend:       Karie Soda, RN 03/19/2018 10:14 AM

## 2018-03-19 NOTE — Progress Notes (Signed)
Agree [name], PA-C    03/19/2018 5:06 PM

## 2018-03-20 NOTE — Progress Notes (Signed)
Remote ICD transmission.   

## 2018-03-21 ENCOUNTER — Encounter: Payer: Self-pay | Admitting: Cardiology

## 2018-03-21 LAB — CUP PACEART REMOTE DEVICE CHECK
Battery Remaining Longevity: 59 mo
Battery Voltage: 2.93 V
Brady Statistic AP VS Percent: 1 %
Brady Statistic AS VS Percent: 99 %
Brady Statistic RA Percent Paced: 1 %
HIGH POWER IMPEDANCE MEASURED VALUE: 71 Ohm
HIGH POWER IMPEDANCE MEASURED VALUE: 71 Ohm
Implantable Lead Implant Date: 20150313
Implantable Lead Location: 753860
Lead Channel Pacing Threshold Amplitude: 0.75 V
Lead Channel Pacing Threshold Pulse Width: 0.5 ms
Lead Channel Sensing Intrinsic Amplitude: 7.7 mV
Lead Channel Setting Pacing Amplitude: 2 V
Lead Channel Setting Pacing Amplitude: 2.5 V
Lead Channel Setting Sensing Sensitivity: 0.5 mV
MDC IDC LEAD IMPLANT DT: 20150313
MDC IDC LEAD LOCATION: 753859
MDC IDC MSMT BATTERY REMAINING PERCENTAGE: 59 %
MDC IDC MSMT LEADCHNL RA IMPEDANCE VALUE: 410 Ohm
MDC IDC MSMT LEADCHNL RA PACING THRESHOLD AMPLITUDE: 0.75 V
MDC IDC MSMT LEADCHNL RA SENSING INTR AMPL: 5 mV
MDC IDC MSMT LEADCHNL RV IMPEDANCE VALUE: 450 Ohm
MDC IDC MSMT LEADCHNL RV PACING THRESHOLD PULSEWIDTH: 0.5 ms
MDC IDC PG IMPLANT DT: 20150313
MDC IDC SESS DTM: 20190513060015
MDC IDC SET LEADCHNL RV PACING PULSEWIDTH: 0.5 ms
MDC IDC STAT BRADY AP VP PERCENT: 1 %
MDC IDC STAT BRADY AS VP PERCENT: 1 %
MDC IDC STAT BRADY RV PERCENT PACED: 1 %
Pulse Gen Serial Number: 7142670

## 2018-03-23 DIAGNOSIS — R69 Illness, unspecified: Secondary | ICD-10-CM | POA: Diagnosis not present

## 2018-04-19 ENCOUNTER — Ambulatory Visit (INDEPENDENT_AMBULATORY_CARE_PROVIDER_SITE_OTHER): Payer: Medicare HMO

## 2018-04-19 DIAGNOSIS — Z95810 Presence of automatic (implantable) cardiac defibrillator: Secondary | ICD-10-CM

## 2018-04-19 DIAGNOSIS — I5022 Chronic systolic (congestive) heart failure: Secondary | ICD-10-CM | POA: Diagnosis not present

## 2018-04-19 NOTE — Progress Notes (Signed)
EPIC Encounter for ICM Monitoring  Patient Name: Gary [name] is a 67 y.o. male Date: 04/19/2018 Primary Care Physican: Samuel Jester, DO Primary Cardiologist:Cooper/Weaver PA Electrophysiologist: Allred Dry Weight:126lbs       Heart Failure questions reviewed, pt's weight has increased by 2 lbs in the last couple of weeks.  Reviewed fluid symptoms to report.    Thoracic impedance normal but was abnormal suggesting fluid accumulation from 03/29/2018 - 04/05/2018 and 04/09/2018 to 04/17/2018.  There have been an increase in abnormal impedance episodes since March.   No diuretic  Recommendations:  Patient eats out and has TV dinner at night. Encouraged him to limit salt and discuss with his family alternative solutions to TV dinners.   Encouraged to call for fluid symptoms.  Follow-up plan: ICM clinic phone appointment on 05/21/2018.    Copy of ICM check sent to Dr. Johney Frame.   3 month ICM trend: 04/19/2018    1 Year ICM trend:       Karie Soda, RN 04/19/2018 9:01 AM

## 2018-05-14 DIAGNOSIS — R739 Hyperglycemia, unspecified: Secondary | ICD-10-CM | POA: Diagnosis not present

## 2018-05-14 DIAGNOSIS — Z125 Encounter for screening for malignant neoplasm of prostate: Secondary | ICD-10-CM | POA: Diagnosis not present

## 2018-05-14 DIAGNOSIS — I10 Essential (primary) hypertension: Secondary | ICD-10-CM | POA: Diagnosis not present

## 2018-05-14 DIAGNOSIS — E782 Mixed hyperlipidemia: Secondary | ICD-10-CM | POA: Diagnosis not present

## 2018-05-16 DIAGNOSIS — M5030 Other cervical disc degeneration, unspecified cervical region: Secondary | ICD-10-CM | POA: Diagnosis not present

## 2018-05-16 DIAGNOSIS — E782 Mixed hyperlipidemia: Secondary | ICD-10-CM | POA: Diagnosis not present

## 2018-05-16 DIAGNOSIS — Z550 Illiteracy and low-level literacy: Secondary | ICD-10-CM | POA: Diagnosis not present

## 2018-05-16 DIAGNOSIS — I10 Essential (primary) hypertension: Secondary | ICD-10-CM | POA: Diagnosis not present

## 2018-05-16 DIAGNOSIS — M1711 Unilateral primary osteoarthritis, right knee: Secondary | ICD-10-CM | POA: Diagnosis not present

## 2018-05-16 DIAGNOSIS — R69 Illness, unspecified: Secondary | ICD-10-CM | POA: Diagnosis not present

## 2018-05-21 ENCOUNTER — Ambulatory Visit (INDEPENDENT_AMBULATORY_CARE_PROVIDER_SITE_OTHER): Payer: Medicare HMO

## 2018-05-21 DIAGNOSIS — I5022 Chronic systolic (congestive) heart failure: Secondary | ICD-10-CM

## 2018-05-21 DIAGNOSIS — Z95810 Presence of automatic (implantable) cardiac defibrillator: Secondary | ICD-10-CM

## 2018-05-21 NOTE — Progress Notes (Signed)
EPIC Encounter for ICM Monitoring  Patient Name: Gary [name] is a 67 y.o. male Date: 05/21/2018 Primary Care Physican: Samuel Jester, DO Primary Cardiologist:Cooper/Weaver PA Electrophysiologist: Allred Dry Weight:123lbs      Heart Failure questions reviewed, pt asymptomatic.   Thoracic impedance normal.  Prescribed dosage: No diuretic  Recommendations: No changes.  Encouraged to call for fluid symptoms.  Follow-up plan: ICM clinic phone appointment on 06/21/2018.    Copy of ICM check sent to Dr. Johney Frame.   3 month ICM trend: 05/21/2018    1 Year ICM trend:       Karie Soda, RN 05/21/2018 9:43 AM

## 2018-05-24 DIAGNOSIS — R69 Illness, unspecified: Secondary | ICD-10-CM | POA: Diagnosis not present

## 2018-06-21 ENCOUNTER — Ambulatory Visit (INDEPENDENT_AMBULATORY_CARE_PROVIDER_SITE_OTHER): Payer: Medicare HMO | Admitting: *Deleted

## 2018-06-21 ENCOUNTER — Ambulatory Visit (INDEPENDENT_AMBULATORY_CARE_PROVIDER_SITE_OTHER): Payer: Medicare HMO

## 2018-06-21 DIAGNOSIS — I472 Ventricular tachycardia, unspecified: Secondary | ICD-10-CM

## 2018-06-21 DIAGNOSIS — Z95810 Presence of automatic (implantable) cardiac defibrillator: Secondary | ICD-10-CM

## 2018-06-21 DIAGNOSIS — I255 Ischemic cardiomyopathy: Secondary | ICD-10-CM

## 2018-06-21 DIAGNOSIS — I5022 Chronic systolic (congestive) heart failure: Secondary | ICD-10-CM | POA: Diagnosis not present

## 2018-06-21 NOTE — Progress Notes (Signed)
EPIC Encounter for ICM Monitoring  Patient Name: Gary [name] is a 67 y.o. male Date: 06/21/2018 Primary Care Physican: Samuel Jester, DO Primary Cardiologist:Cooper/Weaver PA Electrophysiologist: Allred Dry Weight: 129lbs                                                  Heart Failure questions reviewed, pt reported he has cold with chest congestion and runny nose.  Patient does not follow low salt diet. He eats a lot of frozen dinners and drinks a lot of fluids.  He is unsure of intake of fluid or salt and advised to have his daughter help him review his diet and fluid intake.  He does not cook and lives alone.   Reviewed symptoms of HF to report.   Thoracic impedance abnormal starting 8/5 suggesting fluid accumulation but starting to trend toward baseline.  Prescribed dosage: No diuretic  Recommendations:  No changes.  Encouraged to call for fluid symptoms.  Follow-up plan: ICM clinic phone appointment on 06/29/2018 to recheck fluid levels.   Office appointment scheduled 08/08/2018 with Dr. Johney Frame.    Copy of ICM check sent to Dr. Johney Frame and Dr Excell Seltzer for review.   3 month ICM trend: 06/21/2018    1 Year ICM trend:       Gary Soda, RN 06/21/2018 9:29 AM

## 2018-06-21 NOTE — Progress Notes (Signed)
Remote ICD transmission.   

## 2018-06-21 NOTE — Progress Notes (Signed)
Daughter, Pam called to discuss diet.  Patient eats at restaurants 2-3 times a day.  He eats fast food biscuit for breakfast daily, then restaurant country cooking place for lunch and often times eats fast food burgers for supper.  She said she offers to cook for him but he prefers to eat out.  Advised recommendation is to limit salt to 2000 mg daily and fluid total to 64 oz daily.  She will track his food/fluid intake to see how much salt and fluid he is getting daily and help him to adjust the diet if needed.  She said his cholesterol was up and PCP has made change to his cholesterol medication.

## 2018-06-29 ENCOUNTER — Telehealth: Payer: Self-pay

## 2018-06-29 ENCOUNTER — Ambulatory Visit (INDEPENDENT_AMBULATORY_CARE_PROVIDER_SITE_OTHER): Payer: Medicare HMO

## 2018-06-29 DIAGNOSIS — I5022 Chronic systolic (congestive) heart failure: Secondary | ICD-10-CM

## 2018-06-29 DIAGNOSIS — Z95810 Presence of automatic (implantable) cardiac defibrillator: Secondary | ICD-10-CM

## 2018-06-29 NOTE — Telephone Encounter (Signed)
Received voice mail message from friend of patient's stating his phone is broke and unable to send ICM remote transmission today.  Will send one as soon as everything is working correctly.

## 2018-07-02 NOTE — Progress Notes (Signed)
EPIC Encounter for ICM Monitoring  Patient Name: Gary [name] is a 67 y.o. male Date: 07/02/2018 Primary Care Physican: Samuel Jester, DO Primary Cardiologist:Cooper/Weaver PA Electrophysiologist: Allred Dry Weight:118lbs       Heart Failure questions reviewed, pt asymptomatic. He has some sinus congestion   Thoracic impedance returned to normal.  No diuretic  Recommendations: No changes.   Encouraged to call for fluid symptoms.  Follow-up plan: ICM clinic phone appointment on 07/23/2018.   Office appointment scheduled 08/08/2018 with Dr. Johney Frame.    Copy of ICM check sent to Dr. Johney Frame.   3 month ICM trend: 06/29/2018    1 Year ICM trend:       Karie Soda, RN 07/02/2018 10:08 AM

## 2018-07-19 DIAGNOSIS — R69 Illness, unspecified: Secondary | ICD-10-CM | POA: Diagnosis not present

## 2018-07-23 ENCOUNTER — Ambulatory Visit (INDEPENDENT_AMBULATORY_CARE_PROVIDER_SITE_OTHER): Payer: Medicare HMO

## 2018-07-23 DIAGNOSIS — I5022 Chronic systolic (congestive) heart failure: Secondary | ICD-10-CM | POA: Diagnosis not present

## 2018-07-23 DIAGNOSIS — Z95810 Presence of automatic (implantable) cardiac defibrillator: Secondary | ICD-10-CM

## 2018-07-23 NOTE — Progress Notes (Signed)
EPIC Encounter for ICM Monitoring  Patient Name: Gary [name] is a 67 y.o. male Date: 07/23/2018 Primary Care Physican: Samuel Jester, DO Primary Cardiologist:Cooper/Weaver PA Electrophysiologist: Allred Dry Weight:118lbs       Heart Failure questions reviewed, pt asymptomatic.  Patient eats at restaurants frequently making it difficult to manage salt intake.    Thoracic impedance abnormal suggesting fluid accumulation starting 07/22/2018.  No diuretic  Recommendations: No changes.  Reinforced salt restriction to < 2000 mg daily.  Encouraged to call for fluid symptoms.  Follow-up plan: ICM clinic phone appointment on 09/10/2018.  Office appointment scheduled 08/08/2018 with Dr. Johney Frame.    Copy of ICM check sent to Dr. Johney Frame and Dr Excell Seltzer.   3 month ICM trend: 07/23/2018    1 Year ICM trend:       Karie Soda, RN 07/23/2018 8:21 AM

## 2018-07-30 LAB — CUP PACEART REMOTE DEVICE CHECK
Battery Remaining Longevity: 57 mo
Battery Remaining Percentage: 57 %
Battery Voltage: 2.93 V
Brady Statistic AP VP Percent: 1 %
Brady Statistic RA Percent Paced: 1 %
Brady Statistic RV Percent Paced: 1 %
HIGH POWER IMPEDANCE MEASURED VALUE: 80 Ohm
HighPow Impedance: 80 Ohm
Implantable Lead Implant Date: 20150313
Implantable Lead Location: 753859
Implantable Pulse Generator Implant Date: 20150313
Lead Channel Impedance Value: 410 Ohm
Lead Channel Pacing Threshold Amplitude: 0.75 V
Lead Channel Pacing Threshold Pulse Width: 0.5 ms
Lead Channel Sensing Intrinsic Amplitude: 3.2 mV
Lead Channel Sensing Intrinsic Amplitude: 7.7 mV
Lead Channel Setting Pacing Amplitude: 2 V
Lead Channel Setting Pacing Amplitude: 2.5 V
Lead Channel Setting Pacing Pulse Width: 0.5 ms
Lead Channel Setting Sensing Sensitivity: 0.5 mV
MDC IDC LEAD IMPLANT DT: 20150313
MDC IDC LEAD LOCATION: 753860
MDC IDC MSMT LEADCHNL RA PACING THRESHOLD AMPLITUDE: 0.75 V
MDC IDC MSMT LEADCHNL RA PACING THRESHOLD PULSEWIDTH: 0.5 ms
MDC IDC MSMT LEADCHNL RV IMPEDANCE VALUE: 440 Ohm
MDC IDC PG SERIAL: 7142670
MDC IDC SESS DTM: 20190815060017
MDC IDC STAT BRADY AP VS PERCENT: 1 %
MDC IDC STAT BRADY AS VP PERCENT: 1 %
MDC IDC STAT BRADY AS VS PERCENT: 99 %

## 2018-08-08 ENCOUNTER — Ambulatory Visit: Payer: Medicare HMO | Admitting: Internal Medicine

## 2018-08-08 ENCOUNTER — Encounter: Payer: Self-pay | Admitting: Internal Medicine

## 2018-08-08 VITALS — BP 148/86 | HR 58 | Ht 66.0 in | Wt 127.4 lb

## 2018-08-08 DIAGNOSIS — Z95810 Presence of automatic (implantable) cardiac defibrillator: Secondary | ICD-10-CM | POA: Diagnosis not present

## 2018-08-08 DIAGNOSIS — I5022 Chronic systolic (congestive) heart failure: Secondary | ICD-10-CM | POA: Diagnosis not present

## 2018-08-08 DIAGNOSIS — I472 Ventricular tachycardia, unspecified: Secondary | ICD-10-CM

## 2018-08-08 DIAGNOSIS — I255 Ischemic cardiomyopathy: Secondary | ICD-10-CM | POA: Diagnosis not present

## 2018-08-08 DIAGNOSIS — I480 Paroxysmal atrial fibrillation: Secondary | ICD-10-CM
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
## 2018-08-08 NOTE — Patient Instructions (Addendum)
Medication Instructions:  Your physician recommends that you continue on your current medications as directed. Please refer to the Current Medication list given to you today.  Labwork: None ordered.  Testing/Procedures: None ordered.  Follow-Up: Your physician wants you to follow-up in: one year with Francis Dowse.   You will receive a reminder letter in the mail two months in advance. If you don't receive a letter, please call our office to schedule the follow-up appointment.  Remote monitoring is used to monitor your ICD from home. This monitoring reduces the number of office visits required to check your device to one time per year. It allows [place] to keep an eye on the functioning of your device to ensure it is working properly. You are scheduled for a device check from home on 09/10/2018. You may send your transmission at any time that day. If you have a wireless device, the transmission will be sent automatically. After your physician reviews your transmission, you will receive a postcard with your next transmission date.  Any Other Special Instructions Will Be Listed Below (If Applicable).  If you need a refill on your cardiac medications before your next appointment, please call your pharmacy.

## 2018-08-09 ENCOUNTER — Telehealth: Payer: Self-pay

## 2018-08-09 NOTE — Telephone Encounter (Signed)
Returned patient call as requested by voice mail regarding new monitor.  Patient said he received new monitor at office visit with Francis Dowse, PA on 10/2.   He has switched out the old monitor and wants to know if he has correctly hooked up the new monitor.  Advised that the website still shows he has a landline monitor so there needs to be a new serial number entered.  He does not get a cell phone signal in his home and advised the monitor may not work if there is no signal because new monitors are wireless.  Advised he can call tech service number to get assistance but he prefer his daughter talk to tech services.   Advised would call daughter (DPR).

## 2018-08-09 NOTE — Telephone Encounter (Signed)
Attempted call to daughter, Garnell [name]).  Left message she will need to call [facility] service number [phone] to get her father's new monitor to work.  Explained the problems with the monitor.  Advised she should be beside the monitor when the tech service rep is reached.  Left call back number for any questions.

## 2018-08-10 ENCOUNTER — Telehealth: Payer: Self-pay | Admitting: *Deleted

## 2018-08-10 NOTE — Telephone Encounter (Signed)
Spoke with patient to f/u on Merlin monitor cell adapter as discussed at recent OV. Patient reports he was unable to get cell signal at his house. He is aware to continue using landline and monitor is up to date as of today. Patient is appreciative of call and denies questions or concerns at this time.

## 2018-08-10 NOTE — Telephone Encounter (Signed)
Attempted call to patient and left message that remote transmission was received.  Advised next home remote transmission scheduled for 09/10/2018.

## 2018-08-16 DIAGNOSIS — Z550 Illiteracy and low-level literacy: Secondary | ICD-10-CM | POA: Diagnosis not present

## 2018-08-16 DIAGNOSIS — J301 Allergic rhinitis due to pollen: Secondary | ICD-10-CM | POA: Diagnosis not present

## 2018-08-16 DIAGNOSIS — I10 Essential (primary) hypertension: Secondary | ICD-10-CM | POA: Diagnosis not present

## 2018-08-16 DIAGNOSIS — R69 Illness, unspecified: Secondary | ICD-10-CM | POA: Diagnosis not present

## 2018-08-16 DIAGNOSIS — I2510 Atherosclerotic heart disease of native coronary artery without angina pectoris: Secondary | ICD-10-CM | POA: Diagnosis not present

## 2018-08-16 DIAGNOSIS — E782 Mixed hyperlipidemia: Secondary | ICD-10-CM | POA: Diagnosis not present

## 2018-08-30 DIAGNOSIS — R69 Illness, unspecified: Secondary | ICD-10-CM | POA: Diagnosis not present

## 2018-09-10 ENCOUNTER — Telehealth: Payer: Self-pay

## 2018-09-10 ENCOUNTER — Ambulatory Visit (INDEPENDENT_AMBULATORY_CARE_PROVIDER_SITE_OTHER): Payer: Medicare HMO

## 2018-09-10 DIAGNOSIS — Z95810 Presence of automatic (implantable) cardiac defibrillator: Secondary | ICD-10-CM

## 2018-09-10 DIAGNOSIS — I5022 Chronic systolic (congestive) heart failure: Secondary | ICD-10-CM | POA: Diagnosis not present

## 2018-09-10 NOTE — Telephone Encounter (Signed)
Remote ICM transmission received.  Attempted call to daughter regarding ICM remote transmission and left message to return call.   

## 2018-09-10 NOTE — Progress Notes (Signed)
EPIC Encounter for ICM Monitoring  Patient Name: Gary [name] is a 67 y.o. male Date: 09/10/2018 Primary Care Physican: Samuel Jester, DO Primary Cardiologist:Cooper/Weaver PA Electrophysiologist: Allred Dry Weight:Previous weight 118lbs AT/AF Burden: 0%        Attempted call to daughter.  Left message to return call.     Thoracic impedance abnormal suggesting fluid accumulation starting 08/21/2018.   No diuretic  Recommendations:  Unable to reach.  Follow-up plan: ICM clinic phone appointment on 09/20/2018 to recheck fluid levels.  Recall 08/16/2018 with Dr Excell Seltzer.  Patient needs to schedule an appt.   Copy of ICM check sent to Dr. Johney Frame and Dr Excell Seltzer for review.    3 month ICM trend: 09/10/2018    1 Year ICM trend:       Karie Soda, RN 09/10/2018 10:47 AM

## 2018-09-10 NOTE — Progress Notes (Signed)
Call to patient. Transmission reviewed.  He stated he has been eating out more and explained restaurant foods are normally high in salt.  Advised to discuss with daughter other food options instead of eating out to help decrease salt intake.   Advised he is due to make appointment with Heart doctor and he will have his daughter call.

## 2018-09-13 LAB — CUP PACEART INCLINIC DEVICE CHECK
Battery Remaining Longevity: 58 mo
Brady Statistic RA Percent Paced: 0.82 %
Brady Statistic RV Percent Paced: 0 %
HighPow Impedance: 74.25 Ohm
Implantable Lead Implant Date: 20150313
Implantable Lead Location: 753859
Lead Channel Impedance Value: 412.5 Ohm
Lead Channel Pacing Threshold Amplitude: 0.75 V
Lead Channel Pacing Threshold Pulse Width: 0.5 ms
Lead Channel Pacing Threshold Pulse Width: 0.5 ms
Lead Channel Sensing Intrinsic Amplitude: 4 mV
Lead Channel Setting Pacing Amplitude: 2 V
Lead Channel Setting Pacing Amplitude: 2.5 V
Lead Channel Setting Pacing Pulse Width: 0.5 ms
Lead Channel Setting Sensing Sensitivity: 0.5 mV
MDC IDC LEAD IMPLANT DT: 20150313
MDC IDC LEAD LOCATION: 753860
MDC IDC MSMT LEADCHNL RA PACING THRESHOLD AMPLITUDE: 0.75 V
MDC IDC MSMT LEADCHNL RV IMPEDANCE VALUE: 437.5 Ohm
MDC IDC MSMT LEADCHNL RV SENSING INTR AMPL: 7.1 mV
MDC IDC PG IMPLANT DT: 20150313
MDC IDC PG SERIAL: 7142670
MDC IDC SESS DTM: 20191002150607

## 2018-09-20 ENCOUNTER — Ambulatory Visit (INDEPENDENT_AMBULATORY_CARE_PROVIDER_SITE_OTHER): Payer: Medicare HMO

## 2018-09-20 ENCOUNTER — Ambulatory Visit (INDEPENDENT_AMBULATORY_CARE_PROVIDER_SITE_OTHER): Payer: Medicare HMO | Admitting: *Deleted

## 2018-09-20 DIAGNOSIS — I472 Ventricular tachycardia, unspecified: Secondary | ICD-10-CM

## 2018-09-20 DIAGNOSIS — I5022 Chronic systolic (congestive) heart failure: Secondary | ICD-10-CM

## 2018-09-20 DIAGNOSIS — Z95810 Presence of automatic (implantable) cardiac defibrillator: Secondary | ICD-10-CM

## 2018-09-20 DIAGNOSIS — I255 Ischemic cardiomyopathy: Secondary | ICD-10-CM

## 2018-09-20 NOTE — Progress Notes (Signed)
EPIC Encounter for ICM Monitoring  Patient Name: Gary [name] is a 67 y.o. male Date: 09/20/2018 Primary Care Physican: Samuel Jester, DO Primary Cardiologist:Cooper/Weaver PA Electrophysiologist: Allred Last Weight: 120lbs Today's Weight: 125 lbs       Heart Failure questions reviewed, pt asymptomatic.   Thoracic impedance returned to normal since last ICM remote transmission on   Prescribed: No diuretic  Recommendations: No changes.   Encouraged to call for fluid symptoms.  Follow-up plan: ICM clinic phone appointment on 10/15/2018.   Office appointment scheduled 10/16/2018 with [name], PA.    Copy of ICM check sent to Dr. Johney Frame.   3 month ICM trend: 09/20/2018    1 Year ICM trend:       Karie Soda, RN 09/20/2018 11:52 AM

## 2018-09-20 NOTE — Progress Notes (Signed)
Remote ICD transmission.   

## 2018-10-02 DIAGNOSIS — I10 Essential (primary) hypertension: Secondary | ICD-10-CM | POA: Diagnosis not present

## 2018-10-02 DIAGNOSIS — R69 Illness, unspecified: Secondary | ICD-10-CM | POA: Diagnosis not present

## 2018-10-02 DIAGNOSIS — G894 Chronic pain syndrome: Secondary | ICD-10-CM | POA: Diagnosis not present

## 2018-10-02 DIAGNOSIS — I2510 Atherosclerotic heart disease of native coronary artery without angina pectoris: Secondary | ICD-10-CM | POA: Diagnosis not present

## 2018-10-10 ENCOUNTER — Ambulatory Visit: Payer: Medicare HMO | Admitting: Physician Assistant

## 2018-10-10 DIAGNOSIS — R69 Illness, unspecified: Secondary | ICD-10-CM | POA: Diagnosis not present

## 2018-10-11 ENCOUNTER — Telehealth: Payer: Self-pay

## 2018-10-11 NOTE — Telephone Encounter (Signed)
Attempted return call to patient as requested by voice mail message.  Left message stating was returning the call.

## 2018-10-11 NOTE — Telephone Encounter (Signed)
Call to patient.  He called earlier to discuss the BP he took at the drugstore. It was 198/100. He daughter that is a nurse came to his home to recheck it and it was 120/70. The daughter thinks the drugstore reading was inaccurate.  Patient feels fine and no complaints.

## 2018-10-15 ENCOUNTER — Ambulatory Visit (INDEPENDENT_AMBULATORY_CARE_PROVIDER_SITE_OTHER): Payer: Medicare HMO

## 2018-10-15 DIAGNOSIS — Z95810 Presence of automatic (implantable) cardiac defibrillator: Secondary | ICD-10-CM | POA: Diagnosis not present

## 2018-10-15 DIAGNOSIS — I5022 Chronic systolic (congestive) heart failure: Secondary | ICD-10-CM

## 2018-10-15 NOTE — Progress Notes (Signed)
EPIC Encounter for ICM Monitoring  Patient Name: Gary [name] is a 67 y.o. male Date: 10/15/2018 Primary Care Physican: Samuel Jester, DO Primary Cardiologist:Cooper/Weaver PA Electrophysiologist: Allred Last Weight: 125lbs Today's Weight: 125 lbs        Heart Failure questions reviewed, pt asymptomatic.  Patient said he changed his diet and cut back on salt and trying to walk more.  He is drinking water instead of soda.  He also is trying to quit smoking.     Thoracic impedance normal.   Prescribed: No diuretic  Recommendations: No changes.  Positive reinforcement given for the changes in diet and cutting back on smoking.  Follow-up plan: ICM clinic phone appointment on 11/22/2018.   Office appointment scheduled 10/16/2018 with [name], PA.      Copy of ICM check sent to Dr. Johney Frame.   3 month ICM trend: 10/15/2018    1 Year ICM trend:       Karie Soda, RN 10/15/2018 1:39 PM

## 2018-10-16 ENCOUNTER — Other Ambulatory Visit: Payer: Self-pay | Admitting: Physician Assistant

## 2018-10-16 ENCOUNTER — Ambulatory Visit: Payer: Medicare HMO | Admitting: Physician Assistant

## 2018-10-16 ENCOUNTER — Encounter: Payer: Self-pay | Admitting: Physician Assistant

## 2018-10-16 VITALS — BP 148/60 | HR 70 | Ht 66.0 in | Wt 133.0 lb

## 2018-10-16 DIAGNOSIS — I5022 Chronic systolic (congestive) heart failure: Secondary | ICD-10-CM | POA: Diagnosis not present

## 2018-10-16 DIAGNOSIS — E785 Hyperlipidemia, unspecified: Secondary | ICD-10-CM | POA: Diagnosis not present

## 2018-10-16 DIAGNOSIS — Z720 Tobacco use: Secondary | ICD-10-CM

## 2018-10-16 DIAGNOSIS — I10 Essential (primary) hypertension: Secondary | ICD-10-CM

## 2018-10-16 DIAGNOSIS — I480 Paroxysmal atrial fibrillation: Secondary | ICD-10-CM

## 2018-10-16 DIAGNOSIS — Z95810 Presence of automatic (implantable) cardiac defibrillator: Secondary | ICD-10-CM | POA: Diagnosis not present

## 2018-10-16 DIAGNOSIS — I2510 Atherosclerotic heart disease of native coronary artery without angina pectoris: Secondary | ICD-10-CM

## 2018-10-16 NOTE — Progress Notes (Signed)
Cardiology Office Note:    Date:  10/16/2018   ID:  Gary [name], DOB 1951/04/03, MRN [id number]  PCP:  Samuel Jester, DO  Cardiologist:  Tonny Bollman, MD   Electrophysiologist:  Hillis Range, MD   Referring MD: Samuel Jester, DO   Chief Complaint  Patient presents with  . Follow-up    CAD     History of Present Illness:    Gary [name] is a 67 y.o. male with CAD status post non-STEMI in 2014 treated with POBA to the D1, ischemic cardiomyopathy, VT, status post AICD, HTN, HL, paroxysmal atrial fibrillation. He has declined anticoagulation in the past.  He is followed in our [facility] clinic for his congestive heart failure.  I saw him last in 03/2018.     [name] returns for follow up. He is here with his daughter whom Dr. Excell Seltzer and I both see as well.  He denies chest pain, shortness of breath, syncope, paroxysmal nocturnal dyspnea, leg swelling.  His blood pressure was elevated recently and his PCP increased his Losartan.  His BPs at home have improved but are still in the 140s at times.  He has cut back to 2 cigarettes a day and plans to quit next year.    Prior CV studies:   The following studies were reviewed today:  LHC 01/05/17 LM ostial 40 LAD ostial 40  Echo 09/19/16 EF 25-30, diffuse HK, grade 1 diastolic dysfunction, calcified aortic valve, trivial MR, trivial TR  Echo 02/19/14 EF 30-35%  LHC 12/14 LM: Okay LAD: Proximal 20%, D1 95%,  LCx okay RCA: Okay EF 35% PCI: POBA to the D1   Past Medical History:  Diagnosis Date  . Anxiety   . Arthritis    "right shoulder" (01/17/2014)  . Broken neck (HCC) 2000  . CAD (coronary artery disease)    a. NSTEMI 09/2013: - s/p balloon angioplasty of D1, c/b dissection but flow restored with prolonged balloon inflations;  b. 12/2016 Cath: LM 40ost, LAD 40p, LCX nl, RCA nl.  . Hyperlipidemia    a. intolerant to statins. b. Started on Zetia 09/2013.  Marland Kitchen Hypertension   . Ischemic cardiomyopathy    a. 09/2013: EF 30%,  d/c with Lifevest. b.  s/p STJ dual chamber ICD implant 01/2014 by Dr Johney Frame  . Mitral regurgitation    a. Mild by echo 09/2013.  . Paroxysmal atrial fibrillation (HCC) 10/2016   chads2vasc score is 3.  . Protein calorie malnutrition (HCC)   . VT (ventricular tachycardia) (HCC)    a. On admission with NSTEMI 09/2013.   Surgical Hx: The patient  has a past surgical history that includes Implantable cardioverter defibrillator implant (01/17/2014); Cervical fusion (2000); Coronary angioplasty (09/2013); Lead revision (02/20/2014); left heart catheterization with coronary angiogram (N/A, 10/07/2013); implantable cardioverter defibrillator implant (N/A, 01/17/2014); Lead revision (N/A, 02/20/2014); and LEFT HEART CATH AND CORONARY ANGIOGRAPHY (N/A, 01/05/2017).   Current Medications: Current Meds  Medication Sig  . acetaminophen (TYLENOL) 500 MG tablet Take 500 mg by mouth every 6 (six) hours as needed for moderate pain.  Marland Kitchen ALPRAZolam (XANAX) 0.5 MG tablet Take 0.5 mg by mouth 3 (three) times daily.  Marland Kitchen aspirin EC 81 MG EC tablet Take 1 tablet (81 mg total) by mouth daily.  . feeding supplement (BOOST HIGH PROTEIN) LIQD Take 1 Container by mouth daily.  . fenofibrate 160 MG tablet Take 1 tablet by mouth daily.  . isosorbide mononitrate (IMDUR) 30 MG 24 hr tablet Take 1 tablet (30 mg  total) by mouth daily.  Marland Kitchen losartan (COZAAR) 100 MG tablet Take 100 mg by mouth daily.  . nitroGLYCERIN (NITROSTAT) 0.4 MG SL tablet Place 1 tablet (0.4 mg total) under the tongue every 5 (five) minutes as needed for chest pain (up to 3 doses).  . ONE TOUCH ULTRA TEST test strip   . ONETOUCH DELICA LANCETS 33G MISC   . senna (SENOKOT) 8.6 MG TABS tablet Take 1 tablet by mouth every other day.  . [DISCONTINUED] carvedilol (COREG) 25 MG tablet TAKE  (1)  TABLET TWICE A DAY WITH MEALS (BREAKFAST AND SUPPER)  . [DISCONTINUED] losartan (COZAAR) 50 MG tablet Take 50 mg by mouth daily.      Allergies:   Codeine; Morphine and  related; and Statins   Social History   Tobacco Use  . Smoking status: Former Smoker    Packs/day: 2.00    Years: 39.00    Pack years: 78.00    Types: Cigarettes    Last attempt to quit: 10/05/2013    Years since quitting: 5.0  . Smokeless tobacco: Former Neurosurgeon  . Tobacco comment: 01/17/2014 "chewed a little when I was young"  Substance Use Topics  . Alcohol use: No  . Drug use: No     Family Hx: The patient's family history includes Hypertension in his mother.  ROS:   Please see the history of present illness.    Review of Systems  Constitution: Positive for decreased appetite and malaise/fatigue.  Psychiatric/Behavioral: The patient is nervous/anxious.    All other systems reviewed and are negative.   EKGs/Labs/Other Test Reviewed:    EKG:  EKG is not ordered today.    Recent Labs: No results found for requested labs within last 8760 hours.   Recent Lipid Panel Lab Results  Component Value Date/Time   CHOL 229 (H) 01/05/2017 12:26 AM   TRIG 178 (H) 01/05/2017 12:26 AM   HDL 27 (L) 01/05/2017 12:26 AM   CHOLHDL 8.5 01/05/2017 12:26 AM   LDLCALC 166 (H) 01/05/2017 12:26 AM   LDLDIRECT 117.0 06/05/2014 07:49 AM    Physical Exam:    VS:  BP (!) 148/60   Pulse 70   Ht 5\' 6"  (1.676 m)   Wt 133 lb (60.3 kg)   SpO2 97%   BMI 21.47 kg/m     Wt Readings from Last 3 Encounters:  10/16/18 133 lb (60.3 kg)  08/08/18 127 lb 6.4 oz (57.8 kg)  03/19/18 134 lb (60.8 kg)     Physical Exam  Constitutional: He is oriented to person, place, and time. He appears well-developed and well-nourished. No distress.  HENT:  Head: Normocephalic and atraumatic.  Eyes: No scleral icterus.  Neck: No JVD present. No thyromegaly present.  Cardiovascular: Normal rate and regular rhythm.  No murmur heard. Pulmonary/Chest: Effort normal. He has no rales.  Abdominal: Soft. He exhibits no distension.  Musculoskeletal: He exhibits no edema.  Lymphadenopathy:    He has no cervical  adenopathy.  Neurological: He is alert and oriented to person, place, and time.  Skin: Skin is warm and dry.  Psychiatric: He has a normal mood and affect.    ASSESSMENT & PLAN:    Coronary artery disease involving native coronary artery of native heart without angina pectoris History of non-ST elevation MI in 2014 treated balloon angioplasty of the first diagonal.  Cardiac catheterization in 2018 with mild nonobstructive disease.  He denies anginal symptoms. Continue ASA, beta-blocker, nitrates.  Chronic systolic heart failure (HCC) EF 25-30.  NYHA 1-2.  Volume status appears stable.  ICM clinic follow up yesterday demonstrated normal thoracic impedance.  Continue beta-blocker, angiotensin receptor blocker.  Essential hypertension BP has been increased recently at home.  BP readings in the clinic are always high. I have asked him and his daughter to keep an eye on his BP.  If it continues to run above target, he should call us or his PCP for further medication adjustments.  Paroxysmal atrial fibrillation (HCC) He has declined anticoagulation in the past.  S/P ICD March 2015 Continue follow up with EP.  Tobacco abuse He is committed to quitting on 11/07/2018.  Hyperlipidemia We again discussed PCSK9 inhibitors.  He will consider this.  Obtain follow up CMET, Lipids.   Dispo:  Return in about 6 months (around 04/17/2019) for Routine Follow Up, w/ Dr. Excell Seltzer, or [name], PA-C.   Medication Adjustments/Labs and Tests Ordered: Current medicines are reviewed at length with the patient today.  Concerns regarding medicines are outlined above.  Tests Ordered: No orders of the defined types were placed in this encounter.  Medication Changes: No orders of the defined types were placed in this encounter.   Signed, [name], PA-C  10/16/2018 4:43 PM    [facility] [address] [place], [place], [place]  [address] Phone: [phone]; Fax: [phone]

## 2018-10-16 NOTE — Patient Instructions (Addendum)
Medication Instructions:  No changes. See your medication list.  If you need a refill on your cardiac medications before your next appointment, please call your pharmacy.   Lab work: Schedule fasting Lipids and CMET in the next 3-4 weeks with your Primary Care Doctor. Please have the labs faxed to [phone]  If you have labs (blood work) drawn today and your tests are completely normal, you will receive your results only by: Marland Kitchen MyChart Message (if you have MyChart) OR . A paper copy in the mail If you have any lab test that is abnormal or we need to change your treatment, we will call you to review the results.  Testing/Procedures: None   Follow-Up: At [facility], you and your health needs are our priority.  As part of our continuing mission to provide you with exceptional heart care, we have created designated Provider Care Teams.  These Care Teams include your primary Cardiologist (physician) and Advanced Practice Providers (APPs -  Physician Assistants and Nurse Practitioners) who all work together to provide you with the care you need, when you need it. You will need a follow up appointment in:  6 months.  Please call our office 2 months in advance to schedule this appointment.  You may see Tonny Bollman, MD or one of the following Advanced Practice Providers on your designated Care Team: [name], PA-C  Any Other Special Instructions Will Be Listed Below (If Applicable). Keep an eye on your blood pressure.  If it is consistently > 130/80, call us or your primary care doctor to adjust your blood pressure medication further.  The names of the newer cholesterol medications (PCSK9 inhibitors) are:  Repatha  Praluent Let me know if you want to see our Lipid Clinic

## 2018-11-06 DIAGNOSIS — M5030 Other cervical disc degeneration, unspecified cervical region: Secondary | ICD-10-CM | POA: Diagnosis not present

## 2018-11-06 DIAGNOSIS — R69 Illness, unspecified: Secondary | ICD-10-CM | POA: Diagnosis not present

## 2018-11-06 DIAGNOSIS — R739 Hyperglycemia, unspecified: Secondary | ICD-10-CM | POA: Diagnosis not present

## 2018-11-06 DIAGNOSIS — E782 Mixed hyperlipidemia: Secondary | ICD-10-CM | POA: Diagnosis not present

## 2018-11-06 DIAGNOSIS — I2510 Atherosclerotic heart disease of native coronary artery without angina pectoris: Secondary | ICD-10-CM | POA: Diagnosis not present

## 2018-11-06 DIAGNOSIS — I10 Essential (primary) hypertension: Secondary | ICD-10-CM | POA: Diagnosis not present

## 2018-11-12 DIAGNOSIS — E782 Mixed hyperlipidemia: Secondary | ICD-10-CM | POA: Diagnosis not present

## 2018-11-12 DIAGNOSIS — I10 Essential (primary) hypertension: Secondary | ICD-10-CM | POA: Diagnosis not present

## 2018-11-12 DIAGNOSIS — R739 Hyperglycemia, unspecified: Secondary | ICD-10-CM | POA: Diagnosis not present

## 2018-11-20 ENCOUNTER — Telehealth: Payer: Self-pay

## 2018-11-20 ENCOUNTER — Telehealth: Payer: Self-pay | Admitting: Physician Assistant

## 2018-11-20 DIAGNOSIS — E7801 Familial hypercholesterolemia: Secondary | ICD-10-CM

## 2018-11-20 DIAGNOSIS — E785 Hyperlipidemia, unspecified: Secondary | ICD-10-CM

## 2018-11-20 DIAGNOSIS — E7800 Pure hypercholesterolemia, unspecified: Secondary | ICD-10-CM

## 2018-11-20 LAB — CUP PACEART REMOTE DEVICE CHECK
Battery Remaining Longevity: 55 mo
Battery Remaining Percentage: 54 %
Battery Voltage: 2.93 V
Brady Statistic AP VP Percent: 1 %
Brady Statistic AP VS Percent: 1 %
Brady Statistic AS VP Percent: 1 %
Brady Statistic RA Percent Paced: 1 %
Brady Statistic RV Percent Paced: 1 %
Date Time Interrogation Session: 20191114070015
HighPow Impedance: 65 Ohm
HighPow Impedance: 65 Ohm
Implantable Lead Implant Date: 20150313
Implantable Lead Location: 753859
Implantable Lead Location: 753860
Implantable Pulse Generator Implant Date: 20150313
Lead Channel Impedance Value: 410 Ohm
Lead Channel Impedance Value: 410 Ohm
Lead Channel Pacing Threshold Amplitude: 0.75 V
Lead Channel Pacing Threshold Amplitude: 0.75 V
Lead Channel Pacing Threshold Pulse Width: 0.5 ms
Lead Channel Sensing Intrinsic Amplitude: 3.8 mV
Lead Channel Sensing Intrinsic Amplitude: 8 mV
Lead Channel Setting Pacing Amplitude: 2 V
Lead Channel Setting Pacing Amplitude: 2.5 V
Lead Channel Setting Pacing Pulse Width: 0.5 ms
Lead Channel Setting Sensing Sensitivity: 0.5 mV
MDC IDC LEAD IMPLANT DT: 20150313
MDC IDC MSMT LEADCHNL RA PACING THRESHOLD PULSEWIDTH: 0.5 ms
MDC IDC STAT BRADY AS VS PERCENT: 99 %
Pulse Gen Serial Number: 7142670

## 2018-11-20 NOTE — Telephone Encounter (Signed)
Spoke with pt's PCP Dr. Haskel Khan office to request fasting lipid panel recently done. Pt's PCP is going to fax over lab work.

## 2018-11-20 NOTE — Telephone Encounter (Signed)
New Message   Pt is returning call about lab work Please call

## 2018-11-21 NOTE — Telephone Encounter (Signed)
Spoke with pt about results and recommendations who verbalized understanding. Per [name], PA-C to be referred to the lipid cline. Pt is agreeable to the referral. Pt thanked me for the call. Referral orders have been placed in Epic.

## 2018-11-22 ENCOUNTER — Ambulatory Visit (INDEPENDENT_AMBULATORY_CARE_PROVIDER_SITE_OTHER): Payer: Medicare HMO

## 2018-11-22 DIAGNOSIS — I5022 Chronic systolic (congestive) heart failure: Secondary | ICD-10-CM

## 2018-11-22 DIAGNOSIS — Z95810 Presence of automatic (implantable) cardiac defibrillator: Secondary | ICD-10-CM | POA: Diagnosis not present

## 2018-11-22 NOTE — Progress Notes (Signed)
EPIC Encounter for ICM Monitoring  Patient Name: Gary [name] is a 68 y.o. male Date: 11/22/2018 Primary Care Physican: Samuel Jester, DO Primary Cardiologist:Cooper/Weaver PA Electrophysiologist: Allred Last Weight:125lbs Today's Weight: 122 lbs   Heart Failure questions reviewed.   Pt asymptomatic.   Report: Thoracic impedance normal.   Prescribed: No diuretic  Recommendations: No changes.  He is changing his diet to help decrease cholesterol levels. He reported he continues to limit salt intake.  Encouraged to call for fluid symptoms.  Follow-up plan: ICM clinic phone appointment on 12/25/2018.     Copy of ICM check sent to Dr. Johney Frame.   3 month ICM trend: 11/22/2018    1 Year ICM trend:       Karie Soda, RN 11/22/2018 10:28 AM

## 2018-11-28 DIAGNOSIS — R69 Illness, unspecified: Secondary | ICD-10-CM | POA: Diagnosis not present

## 2018-12-25 ENCOUNTER — Ambulatory Visit (INDEPENDENT_AMBULATORY_CARE_PROVIDER_SITE_OTHER): Payer: Medicare HMO

## 2018-12-25 DIAGNOSIS — Z95810 Presence of automatic (implantable) cardiac defibrillator: Secondary | ICD-10-CM | POA: Diagnosis not present

## 2018-12-25 DIAGNOSIS — I5022 Chronic systolic (congestive) heart failure: Secondary | ICD-10-CM | POA: Diagnosis not present

## 2018-12-25 NOTE — Progress Notes (Signed)
EPIC Encounter for ICM Monitoring  Patient Name: Gary [name] is a 68 y.o. male Date: 12/25/2018 Primary Care Physican: Samuel Jester, DO Primary Cardiologist:Cooper/Weaver PA Electrophysiologist: Allred Last Weight:122lbs Today's Weight:unknown   Attempted call to patient and unable to reach.  Left detailed message per DPR regarding transmission. Transmission reviewed.    Report: Thoracic impedance normal.   Prescribed: No diuretic  Recommendations:  No changes.  He is changing his diet to help decrease cholesterol levels. He reported he continues to limit salt intake.  Encouraged to call for fluid symptoms.  Follow-up plan: ICM clinic phone appointment on 01/28/2019.     Copy of ICM check sent to Dr. Johney Frame.   3 month ICM trend: 12/25/2018    1 Year ICM trend:       Karie Soda, RN 12/25/2018 10:05 AM

## 2018-12-26 ENCOUNTER — Ambulatory Visit (INDEPENDENT_AMBULATORY_CARE_PROVIDER_SITE_OTHER): Payer: Medicare HMO

## 2018-12-26 DIAGNOSIS — I472 Ventricular tachycardia, unspecified: Secondary | ICD-10-CM

## 2018-12-26 DIAGNOSIS — I255 Ischemic cardiomyopathy: Secondary | ICD-10-CM

## 2018-12-28 LAB — CUP PACEART REMOTE DEVICE CHECK
Battery Remaining Longevity: 52 mo
Battery Remaining Percentage: 52 %
Battery Voltage: 2.92 V
Brady Statistic AP VP Percent: 1 %
Brady Statistic AP VS Percent: 1 %
Brady Statistic AS VP Percent: 1 %
Brady Statistic AS VS Percent: 99 %
Brady Statistic RV Percent Paced: 1 %
HighPow Impedance: 64 Ohm
HighPow Impedance: 64 Ohm
Implantable Lead Implant Date: 20150313
Implantable Lead Implant Date: 20150313
Implantable Lead Location: 753859
Implantable Lead Location: 753860
Lead Channel Impedance Value: 400 Ohm
Lead Channel Impedance Value: 400 Ohm
Lead Channel Pacing Threshold Amplitude: 0.75 V
Lead Channel Pacing Threshold Pulse Width: 0.5 ms
Lead Channel Sensing Intrinsic Amplitude: 11.8 mV
Lead Channel Sensing Intrinsic Amplitude: 4.2 mV
Lead Channel Setting Pacing Amplitude: 2 V
Lead Channel Setting Pacing Amplitude: 2.5 V
Lead Channel Setting Pacing Pulse Width: 0.5 ms
Lead Channel Setting Sensing Sensitivity: 0.5 mV
MDC IDC MSMT LEADCHNL RV PACING THRESHOLD AMPLITUDE: 0.75 V
MDC IDC MSMT LEADCHNL RV PACING THRESHOLD PULSEWIDTH: 0.5 ms
MDC IDC PG IMPLANT DT: 20150313
MDC IDC SESS DTM: 20200218070015
MDC IDC STAT BRADY RA PERCENT PACED: 1 %
Pulse Gen Serial Number: 7142670

## 2019-01-03 NOTE — Progress Notes (Signed)
Remote ICD transmission.   

## 2019-01-04 ENCOUNTER — Encounter: Payer: Self-pay | Admitting: Cardiology

## 2019-01-14 DIAGNOSIS — R69 Illness, unspecified: Secondary | ICD-10-CM | POA: Diagnosis not present

## 2019-01-28 ENCOUNTER — Other Ambulatory Visit: Payer: Self-pay

## 2019-01-28 ENCOUNTER — Ambulatory Visit (INDEPENDENT_AMBULATORY_CARE_PROVIDER_SITE_OTHER): Payer: Medicare HMO

## 2019-01-28 DIAGNOSIS — I5022 Chronic systolic (congestive) heart failure: Secondary | ICD-10-CM

## 2019-01-28 DIAGNOSIS — Z95810 Presence of automatic (implantable) cardiac defibrillator: Secondary | ICD-10-CM | POA: Diagnosis not present

## 2019-01-28 NOTE — Progress Notes (Signed)
EPIC Encounter for ICM Monitoring  Patient Name: Gary [name] is a 68 y.o. male Date: 01/28/2019 Primary Care Physican: Samuel Jester, DO Primary Cardiologist:Cooper/Weaver PA Electrophysiologist: Allred Last Weight:122lbs Today's Weight:122 lbs  Heart failure questions reviewed and pt asymptomatic.  He said he is low on food but has enough for now.  Advised to contact his daughter regarding food supplies and he stated he would do so.   Report: Thoracic impedance normal.   Prescribed:No diuretic  Recommendations: No changes. Encouraged to call for fluid symptoms.  Follow-up plan: ICM clinic phone appointment on4/27/2020.   Copy of ICM check sent to Dr.Allred.   3 month ICM trend: 01/28/2019    1 Year ICM trend:       Karie Soda, RN 01/28/2019 12:45 PM

## 2019-02-28 DIAGNOSIS — R69 Illness, unspecified: Secondary | ICD-10-CM | POA: Diagnosis not present

## 2019-03-04 ENCOUNTER — Other Ambulatory Visit: Payer: Self-pay

## 2019-03-04 ENCOUNTER — Ambulatory Visit (INDEPENDENT_AMBULATORY_CARE_PROVIDER_SITE_OTHER): Payer: Medicare HMO

## 2019-03-04 DIAGNOSIS — I5022 Chronic systolic (congestive) heart failure: Secondary | ICD-10-CM

## 2019-03-04 DIAGNOSIS — Z95810 Presence of automatic (implantable) cardiac defibrillator: Secondary | ICD-10-CM

## 2019-03-04 NOTE — Progress Notes (Signed)
EPIC Encounter for ICM Monitoring  Patient Name: Gary [name] is a 68 y.o. male Date: 03/04/2019 Primary Care Physican: Samuel Jester, DO Primary Cardiologist:Cooper/Weaver PA Electrophysiologist: Allred Baseline Weight:122lbs 03/04/2019 Weight:unknown  Heart failure questions reviewed and pt asymptomatic.    Corvue Thoracic impedance normal.   Prescribed:No diuretic  Recommendations:No changes. Encouraged to call for fluid symptoms.  Follow-up plan: ICM clinic phone appointment on6/11/2018.   Copy of ICM check sent to Dr.Allred.  3 month ICM trend: 03/04/2019    1 Year ICM trend:       Karie Soda, RN 03/04/2019 11:23 AM

## 2019-03-04 NOTE — Progress Notes (Signed)
Returned patient call as requested by voice mail message.  He said he is doing well and denies fluid symptoms.  Transmission reviewed.  Advised to call for any fluid symptoms. Next remote transmission 04/08/2019.  Weight 122 lbs and stable.

## 2019-03-27 ENCOUNTER — Ambulatory Visit (INDEPENDENT_AMBULATORY_CARE_PROVIDER_SITE_OTHER): Payer: Medicare HMO | Admitting: *Deleted

## 2019-03-27 ENCOUNTER — Other Ambulatory Visit: Payer: Self-pay

## 2019-03-27 DIAGNOSIS — I472 Ventricular tachycardia, unspecified: Secondary | ICD-10-CM

## 2019-03-27 LAB — CUP PACEART REMOTE DEVICE CHECK
Date Time Interrogation Session: 20200520183159
Implantable Lead Implant Date: 20150313
Implantable Lead Implant Date: 20150313
Implantable Lead Location: 753859
Implantable Lead Location: 753860
Implantable Pulse Generator Implant Date: 20150313
Pulse Gen Serial Number: 7142670

## 2019-04-05 ENCOUNTER — Encounter: Payer: Self-pay | Admitting: Cardiology

## 2019-04-05 NOTE — Progress Notes (Signed)
Remote ICD transmission.   

## 2019-04-08 ENCOUNTER — Telehealth: Payer: Self-pay

## 2019-04-08 ENCOUNTER — Ambulatory Visit (INDEPENDENT_AMBULATORY_CARE_PROVIDER_SITE_OTHER): Payer: Medicare HMO

## 2019-04-08 DIAGNOSIS — Z95810 Presence of automatic (implantable) cardiac defibrillator: Secondary | ICD-10-CM

## 2019-04-08 DIAGNOSIS — I5022 Chronic systolic (congestive) heart failure: Secondary | ICD-10-CM

## 2019-04-08 NOTE — Progress Notes (Signed)
Attempted 2nd call to patient for transmission results and no answer.

## 2019-04-08 NOTE — Telephone Encounter (Signed)
Remote ICM transmission received.  Attempted call to patient regarding ICM remote transmission and no answer.  

## 2019-04-08 NOTE — Progress Notes (Signed)
EPIC Encounter for ICM Monitoring  Patient Name: Gary [name] is a 68 y.o. male Date: 04/08/2019 Primary Care Physican: Samuel Jester, DO Primary Cardiologist:Cooper/Weaver PA Electrophysiologist: Allred Baseline Weight:122lbs 04/08/2019 Weight: unknown  Attempted call to patient and unable to reach.   Transmission reviewed.   Corvue Thoracic impedance normal.   Prescribed:No diuretic  Recommendations: Unable to reach.  Follow-up plan: ICM clinic phone appointment on7/04/2019.   Copy of ICM check sent to Dr.Allred.  3 month ICM trend: 04/08/2019    1 Year ICM trend:       Karie Soda, RN 04/08/2019 1:27 PM

## 2019-04-12 NOTE — Progress Notes (Signed)
Call to patient.  He reports he feels fine but has lost weight.  Weight decreased from 122 to 117 lbs.  He has been out of town for a couple of days on [place] to [place] with his [place].  Next remote transmission scheduled for 05/13/2019.  Encouraged to call if experiencing any fluid symptoms.

## 2019-04-15 DIAGNOSIS — R69 Illness, unspecified: Secondary | ICD-10-CM | POA: Diagnosis not present

## 2019-05-13 ENCOUNTER — Ambulatory Visit (INDEPENDENT_AMBULATORY_CARE_PROVIDER_SITE_OTHER): Payer: Medicare HMO

## 2019-05-13 DIAGNOSIS — Z95810 Presence of automatic (implantable) cardiac defibrillator: Secondary | ICD-10-CM | POA: Diagnosis not present

## 2019-05-13 DIAGNOSIS — I5022 Chronic systolic (congestive) heart failure: Secondary | ICD-10-CM

## 2019-05-13 NOTE — Progress Notes (Signed)
EPIC Encounter for ICM Monitoring  Patient Name: Gary [name] is a 68 y.o. male Date: 05/13/2019 Primary Care Physican: Octavio Graves, DO Primary Cardiologist:Cooper/Weaver PA Electrophysiologist: Allred BaselineWeight:122lbs 7/6/2020Weight: 123 lbs  Spoke with patient and he is doing well.  He denies any fluid symptoms.   CorvueThoracic impedance normal.   Prescribed:No diuretic  Recommendations: No changes and encouraged to call if experiencing fluid symptoms.   Follow-up plan: ICM clinic phone appointment on8/18/2020.   Copy of ICM check sent to Dr.Allred.  3 month ICM trend: 05/13/2019    1 Year ICM trend:       Rosalene Billings, RN 05/13/2019 1:22 PM

## 2019-06-03 DIAGNOSIS — R69 Illness, unspecified: Secondary | ICD-10-CM | POA: Diagnosis not present

## 2019-06-10 DIAGNOSIS — R69 Illness, unspecified: Secondary | ICD-10-CM | POA: Diagnosis not present

## 2019-06-25 ENCOUNTER — Ambulatory Visit (INDEPENDENT_AMBULATORY_CARE_PROVIDER_SITE_OTHER): Payer: Medicare HMO

## 2019-06-25 DIAGNOSIS — Z95810 Presence of automatic (implantable) cardiac defibrillator: Secondary | ICD-10-CM

## 2019-06-25 DIAGNOSIS — I5022 Chronic systolic (congestive) heart failure: Secondary | ICD-10-CM

## 2019-06-25 NOTE — Progress Notes (Signed)
EPIC Encounter for ICM Monitoring  Patient Name: Gary [name] is a 68 y.o. male Date: 06/25/2019 Primary Care Physican: Octavio Graves, DO (Inactive) Primary Cardiologist:Cooper/Weaver PA Electrophysiologist: Allred BaselineWeight:122lbs 06/25/2019 Weight: 116 lbs  Spoke with patient and he is doing well.  He denies any fluid symptoms.    CorvueThoracic impedance normal but was suggestive of possible fluid accumulation 7/24 - 8/7.  Prescribed:No diuretic  Recommendations: No changes and encouraged to call if experiencing fluid symptoms.  Encouraged to limit salt intake.   Follow-up plan: ICM clinic phone appointment on 08/26/2019.   Copy of ICM check sent to Dr.Allred.  3 month ICM trend: 06/25/2019    1 Year ICM trend:       Rosalene Billings, RN 06/25/2019 9:27 AM

## 2019-06-26 ENCOUNTER — Ambulatory Visit (INDEPENDENT_AMBULATORY_CARE_PROVIDER_SITE_OTHER): Payer: Medicare HMO | Admitting: *Deleted

## 2019-06-26 DIAGNOSIS — I255 Ischemic cardiomyopathy: Secondary | ICD-10-CM | POA: Diagnosis not present

## 2019-06-27 LAB — CUP PACEART REMOTE DEVICE CHECK
Battery Remaining Longevity: 49 mo
Battery Remaining Percentage: 48 %
Battery Voltage: 2.9 V
Brady Statistic AP VP Percent: 1 %
Brady Statistic AP VS Percent: 1 %
Brady Statistic AS VP Percent: 1 %
Brady Statistic AS VS Percent: 99 %
Brady Statistic RA Percent Paced: 1 %
Brady Statistic RV Percent Paced: 1 %
Date Time Interrogation Session: 20200820121418
HighPow Impedance: 66 Ohm
HighPow Impedance: 66 Ohm
Implantable Lead Implant Date: 20150313
Implantable Lead Implant Date: 20150313
Implantable Lead Location: 753859
Implantable Lead Location: 753860
Implantable Pulse Generator Implant Date: 20150313
Lead Channel Impedance Value: 400 Ohm
Lead Channel Impedance Value: 410 Ohm
Lead Channel Sensing Intrinsic Amplitude: 11.8 mV
Lead Channel Sensing Intrinsic Amplitude: 4.2 mV
Lead Channel Setting Pacing Amplitude: 2 V
Lead Channel Setting Pacing Amplitude: 2.5 V
Lead Channel Setting Pacing Pulse Width: 0.5 ms
Lead Channel Setting Sensing Sensitivity: 0.5 mV
Pulse Gen Serial Number: 7142670

## 2019-06-28 ENCOUNTER — Telehealth: Payer: Self-pay

## 2019-06-28 NOTE — Telephone Encounter (Signed)
Return patient call as requested by voice mail message regarding remote transmission.  He was confused why he was contacted by phone to send a 2nd remote transmission only a day apart.  Advised the transmission was received on the 18th and he should not have received a call asking for a 2nd transmission to be sent.  Reassured him that the personnel assisting the device clinic did not need to call him.  Confirmed next remote transmission date of 08/26/2019.  He appreciated the call back to clear up the confusion.

## 2019-07-04 ENCOUNTER — Encounter: Payer: Self-pay | Admitting: Cardiology

## 2019-07-04 NOTE — Progress Notes (Signed)
Remote ICD transmission.   

## 2019-07-16 DIAGNOSIS — R69 Illness, unspecified: Secondary | ICD-10-CM | POA: Diagnosis not present

## 2019-07-23 DIAGNOSIS — E782 Mixed hyperlipidemia: Secondary | ICD-10-CM | POA: Diagnosis not present

## 2019-07-23 DIAGNOSIS — Z125 Encounter for screening for malignant neoplasm of prostate: Secondary | ICD-10-CM | POA: Diagnosis not present

## 2019-07-23 DIAGNOSIS — M1711 Unilateral primary osteoarthritis, right knee: Secondary | ICD-10-CM | POA: Diagnosis not present

## 2019-07-23 DIAGNOSIS — R69 Illness, unspecified: Secondary | ICD-10-CM | POA: Diagnosis not present

## 2019-07-23 DIAGNOSIS — I10 Essential (primary) hypertension: Secondary | ICD-10-CM | POA: Diagnosis not present

## 2019-07-23 DIAGNOSIS — E559 Vitamin D deficiency, unspecified: Secondary | ICD-10-CM | POA: Diagnosis not present

## 2019-08-26 ENCOUNTER — Ambulatory Visit (INDEPENDENT_AMBULATORY_CARE_PROVIDER_SITE_OTHER): Payer: Medicare HMO

## 2019-08-26 DIAGNOSIS — I5022 Chronic systolic (congestive) heart failure: Secondary | ICD-10-CM | POA: Diagnosis not present

## 2019-08-26 DIAGNOSIS — Z95810 Presence of automatic (implantable) cardiac defibrillator: Secondary | ICD-10-CM | POA: Diagnosis not present

## 2019-08-26 NOTE — Progress Notes (Signed)
EPIC Encounter for ICM Monitoring  Patient Name: Gary [name] is a 68 y.o. male Date: 08/26/2019 Primary Care Physican: Scotty Court, DO Primary Cardiologist:Cooper/Weaver PA Electrophysiologist: Allred 08/26/2019 Weight: 116 lbs  Spoke with patient and he is doing well. He denies any fluid symptoms.    CorvueThoracic impedance normal but was suggestive of possible fluid accumulation 9/25 - 10/5.  Prescribed:No diuretic  Recommendations: No changes and encouraged to call if experiencing fluid symptoms. Encouraged to limit salt intake.   Follow-up plan: ICM clinic phone appointment on 09/26/2019.  91 day device clinic remote transmission 09/25/2019.    Copy of ICM check sent to Dr. Rayann Heman.   3 month ICM trend: 08/26/2019    1 Year ICM trend:       Rosalene Billings, RN 08/26/2019 1:55 PM

## 2019-09-04 DIAGNOSIS — R69 Illness, unspecified: Secondary | ICD-10-CM | POA: Diagnosis not present
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
## 2019-09-25 ENCOUNTER — Ambulatory Visit (INDEPENDENT_AMBULATORY_CARE_PROVIDER_SITE_OTHER): Payer: Medicare HMO | Admitting: *Deleted

## 2019-09-25 DIAGNOSIS — I5022 Chronic systolic (congestive) heart failure: Secondary | ICD-10-CM

## 2019-09-25 DIAGNOSIS — I480 Paroxysmal atrial fibrillation: Secondary | ICD-10-CM

## 2019-09-25 LAB — CUP PACEART REMOTE DEVICE CHECK
Battery Remaining Longevity: 46 mo
Battery Remaining Percentage: 45 %
Battery Voltage: 2.9 V
Brady Statistic AP VP Percent: 1 %
Brady Statistic AP VS Percent: 1.1 %
Brady Statistic AS VP Percent: 1 %
Brady Statistic AS VS Percent: 99 %
Brady Statistic RA Percent Paced: 1.1 %
Brady Statistic RV Percent Paced: 1 %
Date Time Interrogation Session: 20201118070017
HighPow Impedance: 65 Ohm
HighPow Impedance: 65 Ohm
Implantable Lead Implant Date: 20150313
Implantable Lead Implant Date: 20150313
Implantable Lead Location: 753859
Implantable Lead Location: 753860
Implantable Pulse Generator Implant Date: 20150313
Lead Channel Impedance Value: 400 Ohm
Lead Channel Impedance Value: 410 Ohm
Lead Channel Pacing Threshold Amplitude: 0.75 V
Lead Channel Pacing Threshold Amplitude: 0.75 V
Lead Channel Pacing Threshold Pulse Width: 0.5 ms
Lead Channel Pacing Threshold Pulse Width: 0.5 ms
Lead Channel Sensing Intrinsic Amplitude: 11.8 mV
Lead Channel Sensing Intrinsic Amplitude: 3.4 mV
Lead Channel Setting Pacing Amplitude: 2 V
Lead Channel Setting Pacing Amplitude: 2.5 V
Lead Channel Setting Pacing Pulse Width: 0.5 ms
Lead Channel Setting Sensing Sensitivity: 0.5 mV
Pulse Gen Serial Number: 7142670

## 2019-09-25 NOTE — Progress Notes (Signed)
Office defib check scheduled with Tommye Standard, PA on 09/26/2019 and next ICM rescheduled from 09/26/2019 to 10/28/2019.

## 2019-09-26 ENCOUNTER — Ambulatory Visit: Payer: Medicare HMO | Admitting: Physician Assistant

## 2019-09-26 ENCOUNTER — Other Ambulatory Visit: Payer: Self-pay

## 2019-09-26 VITALS — BP 186/80 | HR 53 | Ht 66.0 in | Wt 121.0 lb

## 2019-09-26 DIAGNOSIS — I4510 Unspecified right bundle-branch block: Secondary | ICD-10-CM

## 2019-09-26 DIAGNOSIS — Z95810 Presence of automatic (implantable) cardiac defibrillator: Secondary | ICD-10-CM | POA: Diagnosis not present

## 2019-09-26 DIAGNOSIS — I5022 Chronic systolic (congestive) heart failure: Secondary | ICD-10-CM | POA: Diagnosis not present

## 2019-09-26 DIAGNOSIS — I255 Ischemic cardiomyopathy: Secondary | ICD-10-CM

## 2019-09-26 DIAGNOSIS — I2510 Atherosclerotic heart disease of native coronary artery without angina pectoris: Secondary | ICD-10-CM

## 2019-09-26 NOTE — Patient Instructions (Signed)
Medication Instructions:   Your physician recommends that you continue on your current medications as directed. Please refer to the Current Medication list given to you today.  *If you need a refill on your cardiac medications before your next appointment, please call your pharmacy*  Lab Work: [place]   If you have labs (blood work) drawn today and your tests are completely normal, you will receive your results only by: Marland Kitchen MyChart Message (if you have MyChart) OR . A paper copy in the mail If you have any lab test that is abnormal or we need to change your treatment, we will call you to review the results.  Testing/Procedures: NONE ORDERED  TODAY   Follow-Up: At [facility], you and your health needs are our priority.  As part of our continuing mission to provide you with exceptional heart care, we have created designated Provider Care Teams.  These Care Teams include your primary Cardiologist (physician) and Advanced Practice Providers (APPs -  Physician Assistants and Nurse Practitioners) who all work together to provide you with the care you need, when you need it.  Your next appointment:    Kathlen Mody or [place]  Next available asap overdue   And   12 month(s)  The format for your next appointment:   In Person  Provider:   You may see Thompson Grayer, MD  or one of the following Advanced Practice Providers on your designated Care Team:    Chanetta Marshall, NP  Tommye Standard, PA-C  Legrand Como "[place], [place]   Other Instructions

## 2019-10-02 ENCOUNTER — Other Ambulatory Visit: Payer: Self-pay | Admitting: Physician Assistant

## 2019-10-14 ENCOUNTER — Ambulatory Visit: Payer: Medicare HMO | Admitting: Physician Assistant

## 2019-10-14 ENCOUNTER — Other Ambulatory Visit: Payer: Self-pay

## 2019-10-14 ENCOUNTER — Encounter: Payer: Self-pay | Admitting: Physician Assistant

## 2019-10-14 VITALS — BP 172/90 | HR 65 | Ht 66.0 in | Wt 125.2 lb

## 2019-10-14 DIAGNOSIS — Z95810 Presence of automatic (implantable) cardiac defibrillator: Secondary | ICD-10-CM

## 2019-10-14 DIAGNOSIS — E785 Hyperlipidemia, unspecified: Secondary | ICD-10-CM | POA: Diagnosis not present

## 2019-10-14 DIAGNOSIS — I5022 Chronic systolic (congestive) heart failure: Secondary | ICD-10-CM | POA: Diagnosis not present

## 2019-10-14 DIAGNOSIS — I10 Essential (primary) hypertension: Secondary | ICD-10-CM | POA: Diagnosis not present

## 2019-10-14 DIAGNOSIS — I4510 Unspecified right bundle-branch block: Secondary | ICD-10-CM

## 2019-10-14 DIAGNOSIS — I2510 Atherosclerotic heart disease of native coronary artery without angina pectoris: Secondary | ICD-10-CM | POA: Diagnosis not present

## 2019-10-14 NOTE — Patient Instructions (Signed)
Medication Instructions:   Your physician recommends that you continue on your current medications as directed. Please refer to the Current Medication list given to you today.  *If you need a refill on your cardiac medications before your next appointment, please call your pharmacy*  Lab Work:  None ordered today  Testing/Procedures:  Your physician has requested that you have an echocardiogram. Echocardiography is a painless test that uses sound waves to create images of your heart. It provides your doctor with information about the size and shape of your heart and how well your heart's chambers and valves are working. This procedure takes approximately one hour. There are no restrictions for this procedure.   Follow-Up: At [facility], you and your health needs are our priority.  As part of our continuing mission to provide you with exceptional heart care, we have created designated Provider Care Teams.  These Care Teams include your primary Cardiologist (physician) and Advanced Practice Providers (APPs -  Physician Assistants and Nurse Practitioners) who all work together to provide you with the care you need, when you need it.  Your next appointment:   12 month(s)  The format for your next appointment:   Either In Person or Virtual  Provider:   You may see Sherren Mocha, MD or one of the following Advanced Practice Providers on your designated Care Team:    Richardson Dopp, PA-C  [place], [place]  Daune Perch, NP   Other Instructions  Check your blood pressure once a day for a week and call with the readings. Have your primary care physician fax [place] your lab results when they are drawn next time. Our fax number is [phone].

## 2019-10-14 NOTE — Progress Notes (Signed)
Cardiology Office Note:    Date:  10/14/2019   ID:  Gary [name], DOB 08-20-1951, MRN [id number]  PCP:  Gary Sender, FNP  Cardiologist:  Gary Mocha, MD  Electrophysiologist:  Gary Grayer, MD   Referring MD: Gary [name]*   Chief Complaint  Patient presents with  . Follow-up    CAD, CHF, new RBBB    History of Present Illness:    Gary [name] is a 68 y.o. male with:   Coronary artery disease   S/p NSTEMI in 2014 >> PCI: POBA to D1   Chronic systolic CHF  Ischemic CM  VT  S/p AICD  Paroxysmal AFib  CHA2DS2-VASc=4 (CHF, CAD, HTN, age x 1) >> pt has declined anticoagulation    Hypertension   Hyperlipidemia   Gary [name] was last seen by Gary Standard, PA-C 09/26/2019 for EP follow up.  He was noted to have a new RBBB and it was recommended he follow up with Dr. Burt [name] or me.    He returns for follow up.  He is here with his daughter.  He has not had chest pain, significant shortness of breath, syncope, orthopnea, leg swelling.  His BP typically increases in clinic.  It has bee optimal at home in the past.    Prior CV studies:   The following studies were reviewed today:  LHC 01/05/17 LM ostial 40 LAD ostial 40  Echo 09/19/16 EF 25-30, diffuse HK, grade 1 diastolic dysfunction, calcified aortic valve, trivial MR, trivial TR  Echo 02/19/14 EF 30-35%  LHC 12/14 LM: Okay LAD: Proximal 20%, D1 95%,  LCx okay RCA: Okay EF 35% PCI: POBA to the D1  Past Medical History:  Diagnosis Date  . Anxiety   . Arthritis    "right shoulder" (01/17/2014)  . Broken neck ([place]) 2000  . CAD (coronary artery disease)    a. NSTEMI 09/2013: - s/p balloon angioplasty of D1, c/b dissection but flow restored with prolonged balloon inflations;  b. 12/2016 Cath: LM 40ost, LAD 40p, LCX nl, RCA nl.  . Hyperlipidemia    a. intolerant to statins. b. Started on Zetia 09/2013.  Marland Kitchen Hypertension   . Ischemic cardiomyopathy    a. 09/2013: EF 30%, d/c with  Lifevest. b.  s/p STJ dual chamber ICD implant 01/2014 by Dr Rayann [name]  . Mitral regurgitation    a. Mild by echo 09/2013.  . Paroxysmal atrial fibrillation ([place]) 10/2016   chads2vasc score is 3.  . Protein calorie malnutrition ([place])   . VT (ventricular tachycardia) ([place])    a. On admission with NSTEMI 09/2013.   Surgical Hx: The patient  has a past surgical history that includes Implantable cardioverter defibrillator implant (01/17/2014); Cervical fusion (2000); Coronary angioplasty (09/2013); Lead revision (02/20/2014); left heart catheterization with coronary angiogram (N/A, 10/07/2013); implantable cardioverter defibrillator implant (N/A, 01/17/2014); Lead revision (N/A, 02/20/2014); and LEFT HEART CATH AND CORONARY ANGIOGRAPHY (N/A, 01/05/2017).   Current Medications: Current Meds  Medication Sig  . acetaminophen (TYLENOL) 500 MG tablet Take 500 mg by mouth every 6 (six) hours as needed for moderate pain.  Marland Kitchen ALPRAZolam (XANAX) 0.5 MG tablet Take 0.5 mg by mouth at bedtime.   Marland Kitchen aspirin EC 81 MG EC tablet Take 1 tablet (81 mg total) by mouth daily.  . carvedilol (COREG) 25 MG tablet TAKE (1) TABLET TWICE A DAY WITH MEALS (BREAKFAST AND SUPPER)  . escitalopram (LEXAPRO) 5 MG tablet Take 5 mg by mouth daily.  . fenofibrate 160 MG  tablet Take 1 tablet by mouth daily.  . isosorbide mononitrate (IMDUR) 30 MG 24 hr tablet Take 1 tablet (30 mg total) by mouth daily.  Marland Kitchen losartan (COZAAR) 100 MG tablet Take 100 mg by mouth daily.  . nitroGLYCERIN (NITROSTAT) 0.4 MG SL tablet Place 1 tablet (0.4 mg total) under the tongue every 5 (five) minutes as needed for chest pain (up to 3 doses).  . ONE TOUCH ULTRA TEST test strip   . ONETOUCH DELICA LANCETS 33G MISC   . senna (SENOKOT) 8.6 MG TABS tablet Take 1 tablet by mouth every other day.     Allergies:   Codeine, Morphine and related, and Statins   Social History   Tobacco Use  . Smoking status: Former Smoker    Packs/day: 2.00    Years: 39.00    Pack  years: 78.00    Types: Cigarettes    Quit date: 10/05/2013    Years since quitting: 6.0  . Smokeless tobacco: Former Neurosurgeon  . Tobacco comment: 01/17/2014 "chewed a little when I was young"  Substance Use Topics  . Alcohol use: No  . Drug use: No     Family Hx: The patient's family history includes Hypertension in his mother.  ROS:   Please see the history of present illness.    ROS All other systems reviewed and are negative.   EKGs/Labs/Other Test Reviewed:    EKG:  EKG is not ordered today.  The ekg done on 09/26/2019 demonstrated sinus bradycardia, heart rate 53, left axis deviation, right bundle branch block  Recent Labs: No results found for requested labs within last 8760 hours.   Recent Lipid Panel Lab Results  Component Value Date/Time   CHOL 229 (H) 01/05/2017 12:26 AM   TRIG 178 (H) 01/05/2017 12:26 AM   HDL 27 (L) 01/05/2017 12:26 AM   CHOLHDL 8.5 01/05/2017 12:26 AM   LDLCALC 166 (H) 01/05/2017 12:26 AM   LDLDIRECT 117.0 06/05/2014 07:49 AM    Physical Exam:    VS:  BP (!) 172/90 Comment: left arm  Pulse 65   Ht 5\' 6"  (1.676 m)   Wt 125 lb 3.2 oz (56.8 kg)   SpO2 99%   BMI 20.21 kg/m     Wt Readings from Last 3 Encounters:  10/14/19 125 lb 3.2 oz (56.8 kg)  09/26/19 121 lb (54.9 kg)  10/16/18 133 lb (60.3 kg)     Physical Exam  Constitutional: He is oriented to person, place, and time. He appears well-developed and well-nourished. No distress.  HENT:  Head: Normocephalic and atraumatic.  Eyes: No scleral icterus.  Neck: No JVD present. No thyromegaly present.  Cardiovascular: Normal rate and regular rhythm.  No murmur heard. Pulmonary/Chest: He has no wheezes. He has no rales.  Abdominal: Soft.  Musculoskeletal:        General: No edema.  Lymphadenopathy:    He has no cervical adenopathy.  Neurological: He is alert and oriented to person, place, and time.  Skin: Skin is warm and dry.  Psychiatric: He has a normal mood and affect.     ASSESSMENT & PLAN:    1. RBBB (right bundle branch block) I reviewed several of his previous EKGs and his right bundle branch block appears to be new.  He is completely asymptomatic.  He does have a long history of smoking.  Question if he has developed significant pulmonary pretension or RV dysfunction from lung disease.  Arrange follow-up echocardiogram.  2. Chronic systolic heart failure (HCC)  EF 25-30.  NYHA normal 2.  Volume status is currently stable.  Continue carvedilol, isosorbide, losartan.  3. Coronary artery disease involving native coronary artery of native heart without angina pectoris History of NSTEMI in 2014 treated balloon angioplasty of the D1.  He had nonobstructive disease in 2018 by cardiac catheterization.  He is not having anginal symptoms.  Continue aspirin, beta-blocker, nitrates.  He is statin intolerant.  4. Essential hypertension He typically has elevated blood pressures in clinic.  I will provide him with a blood pressure cuff today and I have asked him to check his blood pressure daily for the next week.  He should see me those readings for review.  If his pressure remains above target, consider adding amlodipine to his medical regimen.  5. Hyperlipidemia, unspecified hyperlipidemia type He has been statin intolerant.  He is not interested in taking PCSK9 inhibitors.  We briefly discussed bempedoic acid.  He will get lipids drawn by primary care in the next couple of months and have those sent to [place] for review.  I have given him some literature regarding bempedoic acid.  If he is interested in trying this medication, he will contact us so I can refer him to the lipid clinic.  6. ICD (implantable cardioverter-defibrillator) in place Continue follow-up with EP as planned.   Dispo:  Return in about 1 year (around 10/13/2020) for Routine Follow Up, w/ Dr. Excell Seltzer, or [name], PA-C, in person.   Medication Adjustments/Labs and Tests Ordered: Current medicines are  reviewed at length with the patient today.  Concerns regarding medicines are outlined above.  Tests Ordered: Orders Placed This Encounter  Procedures  . ECHOCARDIOGRAM COMPLETE   Medication Changes: No orders of the defined types were placed in this encounter.   Signed, [name], PA-C  10/14/2019 5:44 PM    [facility] [address] [place], [place], [place]  [address] Phone: [phone]; Fax: [phone]

## 2019-10-24 NOTE — Progress Notes (Signed)
Remote ICD transmission.   

## 2019-10-25 ENCOUNTER — Ambulatory Visit (HOSPITAL_COMMUNITY): Payer: Medicare HMO | Attending: Cardiology

## 2019-10-25 ENCOUNTER — Other Ambulatory Visit: Payer: Self-pay

## 2019-10-25 DIAGNOSIS — I5022 Chronic systolic (congestive) heart failure: Secondary | ICD-10-CM | POA: Diagnosis not present

## 2019-10-25 DIAGNOSIS — I4510 Unspecified right bundle-branch block: Secondary | ICD-10-CM | POA: Diagnosis not present

## 2019-10-27 ENCOUNTER — Encounter: Payer: Self-pay | Admitting: Physician Assistant

## 2019-10-28 ENCOUNTER — Telehealth: Payer: Self-pay

## 2019-10-28 ENCOUNTER — Ambulatory Visit (INDEPENDENT_AMBULATORY_CARE_PROVIDER_SITE_OTHER): Payer: Medicare HMO

## 2019-10-28 DIAGNOSIS — I5022 Chronic systolic (congestive) heart failure: Secondary | ICD-10-CM

## 2019-10-28 DIAGNOSIS — Z95810 Presence of automatic (implantable) cardiac defibrillator: Secondary | ICD-10-CM | POA: Diagnosis not present

## 2019-10-28 NOTE — Telephone Encounter (Signed)
Remote ICM transmission received.  Attempted call to patient regarding ICM remote transmission and left detailed message per DPR to return call.   

## 2019-10-28 NOTE — Progress Notes (Signed)
EPIC Encounter for ICM Monitoring  Patient Name: Gary [name] is a 68 y.o. male Date: 10/28/2019 Primary Care Physican: Wannetta Sender, FNP Primary Cardiologist:Cooper/Weaver PA Electrophysiologist: Allred 10/25/2019 Weight: 118 lbs  Spoke with patient and he is doing well. He denies any fluid symptoms.   CorvueThoracic impedance normal.  Prescribed:No diuretic  Recommendations: No changes and encouraged to call if experiencing fluid symptoms.Encouraged to limit salt intake.  Follow-up plan: ICM clinic phone appointment on 12/02/2019.   91 day device clinic remote transmission 12/25/2019.    Copy of ICM check sent to Dr. Rayann Heman.   3 month ICM trend: 10/28/2019    1 Year ICM trend:       Rosalene Billings, RN 10/28/2019 1:16 PM

## 2019-11-04 DIAGNOSIS — R69 Illness, unspecified: Secondary | ICD-10-CM | POA: Diagnosis not present

## 2019-12-02 ENCOUNTER — Ambulatory Visit (INDEPENDENT_AMBULATORY_CARE_PROVIDER_SITE_OTHER): Payer: Medicare Other

## 2019-12-02 DIAGNOSIS — I5022 Chronic systolic (congestive) heart failure: Secondary | ICD-10-CM

## 2019-12-02 DIAGNOSIS — Z95810 Presence of automatic (implantable) cardiac defibrillator: Secondary | ICD-10-CM

## 2019-12-02 NOTE — Progress Notes (Signed)
EPIC Encounter for ICM Monitoring  Patient Name: Gary [name] is a 69 y.o. male Date: 12/02/2019 Primary Care Physican: Jason Coop, FNP Primary Cardiologist:Cooper/Weaver PA Electrophysiologist: Allred 12/02/2019 Weight: 118 lbs  Spoke with patient and he is doing well. He denies any fluid symptoms and he tries to limit salt intake.   CorvueThoracic impedance suggesting possible fluid accumulation since 11/30/19.  Prescribed:No diuretic  Recommendations: Encouraged to limit salt intake and to call if experiencing any fluid symptoms.  Follow-up plan: ICM clinic phone appointment on 12/11/2019 to recheck fluid levels.   91 day device clinic remote transmission 12/25/2019.    Copy of ICM check sent to Dr. Johney Frame.   3 month ICM trend: 12/02/2019    1 Year ICM trend:       Karie Soda, RN 12/02/2019 2:20 PM

## 2019-12-11 ENCOUNTER — Ambulatory Visit (INDEPENDENT_AMBULATORY_CARE_PROVIDER_SITE_OTHER): Payer: Medicare Other

## 2019-12-11 ENCOUNTER — Telehealth: Payer: Self-pay

## 2019-12-11 DIAGNOSIS — I5022 Chronic systolic (congestive) heart failure: Secondary | ICD-10-CM

## 2019-12-11 DIAGNOSIS — Z95810 Presence of automatic (implantable) cardiac defibrillator: Secondary | ICD-10-CM

## 2019-12-11 NOTE — Progress Notes (Signed)
EPIC Encounter for ICM Monitoring  Patient Name: Gary [name] is a 70 y.o. male Date: 12/11/2019 Primary Care Physican: Jason Coop, FNP Primary Cardiologist:Cooper/Weaver PA Electrophysiologist: Allred 12/02/2019 Weight: 118lbs  Attempted call to patient and unable to reach.  Left detailed message per DPR regarding transmission. Transmission reviewed.   CorvueThoracic impedance returned to normal since 12/02/2019 remote transmission  Prescribed:No diuretic  Recommendations: Left voice mail with ICM number and encouraged to call if experiencing any fluid symptoms.  Follow-up plan: ICM clinic phone appointment on3/11/2019. 91 day device clinic remote transmission 12/25/2019.   Copy of ICM check sent to Dr.Allred.   3 month ICM trend: 12/11/2019    1 Year ICM trend:       Karie Soda, RN 12/11/2019 1:47 PM

## 2019-12-11 NOTE — Telephone Encounter (Signed)
Remote ICM transmission received.  Attempted call to patient regarding ICM remote transmission and left detailed message per DPR.  Advised to return call for any fluid symptoms or questions. Next ICM remote transmission scheduled 01/06/2020.     

## 2019-12-25 ENCOUNTER — Ambulatory Visit (INDEPENDENT_AMBULATORY_CARE_PROVIDER_SITE_OTHER): Payer: Medicare Other | Admitting: *Deleted

## 2019-12-25 DIAGNOSIS — I5022 Chronic systolic (congestive) heart failure: Secondary | ICD-10-CM

## 2019-12-25 LAB — CUP PACEART REMOTE DEVICE CHECK
Battery Remaining Longevity: 44 mo
Battery Remaining Percentage: 44 %
Battery Voltage: 2.9 V
Brady Statistic AP VP Percent: 1 %
Brady Statistic AP VS Percent: 1 %
Brady Statistic AS VP Percent: 1 %
Brady Statistic AS VS Percent: 99 %
Brady Statistic RA Percent Paced: 1 %
Brady Statistic RV Percent Paced: 1 %
Date Time Interrogation Session: 20210217020020
HighPow Impedance: 69 Ohm
HighPow Impedance: 69 Ohm
Implantable Lead Implant Date: 20150313
Implantable Lead Implant Date: 20150313
Implantable Lead Location: 753859
Implantable Lead Location: 753860
Implantable Pulse Generator Implant Date: 20150313
Lead Channel Impedance Value: 430 Ohm
Lead Channel Impedance Value: 480 Ohm
Lead Channel Pacing Threshold Amplitude: 0.75 V
Lead Channel Pacing Threshold Amplitude: 0.75 V
Lead Channel Pacing Threshold Pulse Width: 0.5 ms
Lead Channel Pacing Threshold Pulse Width: 0.5 ms
Lead Channel Sensing Intrinsic Amplitude: 11.8 mV
Lead Channel Sensing Intrinsic Amplitude: 4.2 mV
Lead Channel Setting Pacing Amplitude: 2 V
Lead Channel Setting Pacing Amplitude: 2.5 V
Lead Channel Setting Pacing Pulse Width: 0.5 ms
Lead Channel Setting Sensing Sensitivity: 0.5 mV
Pulse Gen Serial Number: 7142670

## 2019-12-25 NOTE — Progress Notes (Signed)
ICD Remote  

## 2020-01-06 ENCOUNTER — Telehealth: Payer: Self-pay

## 2020-01-06 ENCOUNTER — Ambulatory Visit (INDEPENDENT_AMBULATORY_CARE_PROVIDER_SITE_OTHER): Payer: Medicare Other

## 2020-01-06 DIAGNOSIS — Z95810 Presence of automatic (implantable) cardiac defibrillator: Secondary | ICD-10-CM

## 2020-01-06 DIAGNOSIS — I5022 Chronic systolic (congestive) heart failure: Secondary | ICD-10-CM | POA: Diagnosis not present

## 2020-01-06 NOTE — Telephone Encounter (Signed)
Received voice mail message from patient's daughter stating patient unable to send remote transmission today due to his phone is not working. He will send a report when he can.

## 2020-01-07 NOTE — Telephone Encounter (Signed)
Returned call to daughter, Emersyn Kotarski, DPR as requested by voice mail.  She reports it may take several days for patient's landline to be repaired but he will send a remote transmission as soon as it is working.  Advised it is no problem to send report at a later date.  She reports he is feeling fine.  Advised [employer] makes a cell adaptor for the monitor.  She reports cell signal is very weak where he lives and there are no other options for cell, Internet or phone service at his home.  She will call when patient sends a report for review.

## 2020-01-10 NOTE — Progress Notes (Signed)
EPIC Encounter for ICM Monitoring  Patient Name: Gary [name] is a 68 y.o. male Date: 01/10/2020 Primary Care Physican: Jason Coop, FNP Primary Cardiologist:Cooper/Weaver PA Electrophysiologist: Allred 3/5/2021Weight: 118lbs  Spoke with patient and he is doing well.   CorvueThoracic impedancenormal but was suggesting possible fluid accumulation from 2/26 - 3/2.  Prescribed:No diuretic  Recommendations: No changes and encouraged to call if experiencing any fluid symptoms.  Follow-up plan: ICM clinic phone appointment on 02/10/2020. 91 day device clinic remote transmission 03/25/2020.   Copy of ICM check sent to Dr.Allred.  Direct Trend Viewer through 01/10/2020 and returned to normal.   3 month ICM trend: 01/06/2020    1 Year ICM trend:       Karie Soda, RN 01/10/2020 2:30 PM

## 2020-02-10 ENCOUNTER — Ambulatory Visit (INDEPENDENT_AMBULATORY_CARE_PROVIDER_SITE_OTHER): Payer: Medicare Other

## 2020-02-10 DIAGNOSIS — Z95810 Presence of automatic (implantable) cardiac defibrillator: Secondary | ICD-10-CM

## 2020-02-10 DIAGNOSIS — I5022 Chronic systolic (congestive) heart failure: Secondary | ICD-10-CM | POA: Diagnosis not present

## 2020-02-10 NOTE — Progress Notes (Signed)
EPIC Encounter for ICM Monitoring  Patient Name: Gary [name] is a 69 y.o. male Date: 02/10/2020 Primary Care Physican: Jason Coop, FNP Primary Cardiologist:Cooper/Weaver PA Electrophysiologist: Allred 4/5/2021Weight: 118lbs  Spoke with patient and he is doing well.   CorvueThoracic impedancenormal.  Prescribed:No diuretic  Recommendations: No changes and encouraged to call if experiencing any fluid symptoms.  Follow-up plan: ICM clinic phone appointment on5/08/2020. 91 day device clinic remote transmission 03/25/2020.   Copy of ICM check sent to Dr.Allred.  3 month ICM trend: 02/10/2020    1 Year ICM trend:       Karie Soda, RN 02/10/2020 4:13 PM

## 2020-03-16 ENCOUNTER — Telehealth: Payer: Self-pay

## 2020-03-16 ENCOUNTER — Ambulatory Visit (INDEPENDENT_AMBULATORY_CARE_PROVIDER_SITE_OTHER): Payer: Medicare Other

## 2020-03-16 DIAGNOSIS — Z95810 Presence of automatic (implantable) cardiac defibrillator: Secondary | ICD-10-CM

## 2020-03-16 DIAGNOSIS — I5022 Chronic systolic (congestive) heart failure: Secondary | ICD-10-CM

## 2020-03-16 NOTE — Telephone Encounter (Signed)
Remote ICM transmission received.  Attempted call to patient regarding ICM remote transmission and no answer or answering machine. 

## 2020-03-16 NOTE — Progress Notes (Signed)
EPIC Encounter for ICM Monitoring  Patient Name: PARKS CZAJKOWSKI is a 69 y.o. male Date: 03/16/2020 Primary Care Physican: Jason Coop, FNP Primary Cardiologist:Cooper/Weaver PA Electrophysiologist: Allred 4/5/2021Weight: 118lbs  AT/AF Burden: <1%  Attempted call to patient and unable to reach. Transmission reviewed.   CorvueThoracic impedancesuggesting possible fluid accumulation starting 03/10/20.  Prescribed:No diuretic  Recommendations:Unable to reach.    Follow-up plan: ICM clinic phone appointment on5/19/2021 to recheck fluid levels. 91 day device clinic remote transmission5/19/2021.   Copy of ICM check sent to Dr.Allred and Dr Excell Seltzer.  3 month ICM trend: 03/16/2020    1 Year ICM trend:       Karie Soda, RN 03/16/2020 11:26 AM

## 2020-03-16 NOTE — Progress Notes (Signed)
Patient returned call and reports he is feeling fine. He denies any fluid symptoms.  Transmission reviewed.  Encouraged to limit salt intake and will recheck fluid levels on 03/25/2020.

## 2020-03-24 ENCOUNTER — Other Ambulatory Visit: Payer: Self-pay

## 2020-03-24 ENCOUNTER — Telehealth: Payer: Self-pay

## 2020-03-24 ENCOUNTER — Ambulatory Visit (INDEPENDENT_AMBULATORY_CARE_PROVIDER_SITE_OTHER): Payer: Medicare Other

## 2020-03-24 DIAGNOSIS — I5022 Chronic systolic (congestive) heart failure: Secondary | ICD-10-CM

## 2020-03-24 DIAGNOSIS — Z95810 Presence of automatic (implantable) cardiac defibrillator: Secondary | ICD-10-CM

## 2020-03-24 NOTE — Telephone Encounter (Signed)
Remote ICM transmission received.  Attempted call to daughter, Denver Bentson, per DPR.  Left message to return call with phone number.

## 2020-03-24 NOTE — Progress Notes (Signed)
EPIC Encounter for ICM Monitoring  Patient Name: Gary [name] is a 69 y.o. male Date: 03/24/2020 Primary Care Physican: Jason Coop, FNP Primary Cardiologist:Cooper/Weaver PA Electrophysiologist: Allred 4/5/2021Weight: 118lbs  AT/AF Burden: <1%  Attempted call to daughter [name] and no answer.   Attempted call to patient also and no answer.  CorvueThoracic impedancecontinues to suggest possible fluid accumulation starting 03/10/20.  Prescribed:No diuretic  Recommendations: Unable to reach.    Follow-up plan: ICM clinic phone appointment on5/26/2021 to recheck fluid levels. 91 day device clinic remote transmission5/19/2021.    Copy of ICM check sent to Dr.Allred and Dr Excell Seltzer.  3 month ICM trend: 03/24/2020    1 Year ICM trend:       Karie Soda, RN 03/24/2020 9:51 AM

## 2020-03-25 ENCOUNTER — Ambulatory Visit (INDEPENDENT_AMBULATORY_CARE_PROVIDER_SITE_OTHER): Payer: Medicare Other | Admitting: *Deleted

## 2020-03-25 DIAGNOSIS — I472 Ventricular tachycardia, unspecified: Secondary | ICD-10-CM

## 2020-03-25 DIAGNOSIS — I5022 Chronic systolic (congestive) heart failure: Secondary | ICD-10-CM

## 2020-03-25 LAB — CUP PACEART REMOTE DEVICE CHECK
Battery Remaining Longevity: 42 mo
Battery Remaining Percentage: 41 %
Battery Voltage: 2.89 V
Brady Statistic AP VP Percent: 1 %
Brady Statistic AP VS Percent: 1 %
Brady Statistic AS VP Percent: 1 %
Brady Statistic AS VS Percent: 99 %
Brady Statistic RA Percent Paced: 1 %
Brady Statistic RV Percent Paced: 1 %
Date Time Interrogation Session: 20210518020015
HighPow Impedance: 69 Ohm
HighPow Impedance: 69 Ohm
Implantable Lead Implant Date: 20150313
Implantable Lead Implant Date: 20150313
Implantable Lead Location: 753859
Implantable Lead Location: 753860
Implantable Pulse Generator Implant Date: 20150313
Lead Channel Impedance Value: 450 Ohm
Lead Channel Impedance Value: 450 Ohm
Lead Channel Pacing Threshold Amplitude: 0.75 V
Lead Channel Pacing Threshold Amplitude: 0.75 V
Lead Channel Pacing Threshold Pulse Width: 0.5 ms
Lead Channel Pacing Threshold Pulse Width: 0.5 ms
Lead Channel Sensing Intrinsic Amplitude: 11.8 mV
Lead Channel Sensing Intrinsic Amplitude: 4.2 mV
Lead Channel Setting Pacing Amplitude: 2 V
Lead Channel Setting Pacing Amplitude: 2.5 V
Lead Channel Setting Pacing Pulse Width: 0.5 ms
Lead Channel Setting Sensing Sensitivity: 0.5 mV
Pulse Gen Serial Number: 7142670

## 2020-03-27 NOTE — Progress Notes (Signed)
Remote ICD transmission.   

## 2020-03-27 NOTE — Progress Notes (Signed)
Attempted call to patient on home number and no answer or answering machine.

## 2020-03-27 NOTE — Telephone Encounter (Signed)
Attempted call to patient and no answer or answering machine.  Spoke with daughter this AM and she said patient's home landline has not been working.  Reviewed remote transmission with her per DPR and she will provide the information to patient if I am unable to reach today.

## 2020-03-27 NOTE — Progress Notes (Signed)
Daughter Pam returned call.  Reviewed transmission. She states she thinks his health is declining.  She notices he is sleeping more, his mind is not as sharp and continues to have a lot of anxiety.  She said he always eats the same foods and has little variation in what he eats.  He likes to eat canned foods such as beanie weenies.  He does not follow a low salt diet.  She has not notice any fluid symptoms.  She said patients [place]  She is unsure if the line is working today.  She will call back if she notices patient has any fluid symptoms.  Daughter will give patient information if I am unable to reach him today. Advised will do another remote transmission on 5/26 to recheck fluid levels

## 2020-04-01 ENCOUNTER — Ambulatory Visit (INDEPENDENT_AMBULATORY_CARE_PROVIDER_SITE_OTHER): Payer: Medicare Other

## 2020-04-01 DIAGNOSIS — I5022 Chronic systolic (congestive) heart failure: Secondary | ICD-10-CM

## 2020-04-01 DIAGNOSIS — Z95810 Presence of automatic (implantable) cardiac defibrillator: Secondary | ICD-10-CM

## 2020-04-01 NOTE — Progress Notes (Signed)
EPIC Encounter for ICM Monitoring  Patient Name: Gary [name] is a 69 y.o. male Date: 04/01/2020 Primary Care Physican: Jason Coop, FNP Primary Cardiologist:Cooper/Weaver PA Electrophysiologist: Allred 4/5/2021Weight: 118lbs  AT/AF Burden: <1%  Spoke with patient and reports feeling well at this time.  Denies fluid symptoms.    CorvueThoracic impedancereturned to normal.  Prescribed:No diuretic  Recommendations: No changes and encouraged to call if experiencing any fluid symptoms..    Follow-up plan: ICM clinic phone appointment on6/21/2021.91 day device clinic remote transmission8/18/2021.    Copy of ICM check sent to Dr.Allred.  3 month ICM trend: 04/01/2020    1 Year ICM trend:       Karie Soda, RN 04/01/2020 9:22 AM

## 2020-04-27 ENCOUNTER — Ambulatory Visit (INDEPENDENT_AMBULATORY_CARE_PROVIDER_SITE_OTHER): Payer: Medicare Other

## 2020-04-27 DIAGNOSIS — I5022 Chronic systolic (congestive) heart failure: Secondary | ICD-10-CM | POA: Diagnosis not present

## 2020-04-27 DIAGNOSIS — Z95810 Presence of automatic (implantable) cardiac defibrillator: Secondary | ICD-10-CM

## 2020-04-27 NOTE — Progress Notes (Signed)
EPIC Encounter for ICM Monitoring  Patient Name: Gary [name] is a 69 y.o. male Date: 04/27/2020 Primary Care Physican: Jason Coop, FNP Primary Cardiologist:Cooper/Weaver PA Electrophysiologist: Allred 4/5/2021Weight: 118lbs      Spoke with patient.  Heart Failure questions reviewed.  Pt is asymptomatic.  Corvue thoracic impedance suggesting possible fluid accumulation starting 04/25/20.   No diuretic prescribed.  Recommendations: Recommendation to limit salt intake to 2000 mg daily and fluid intake to 64 oz daily.  Encouraged to call if experiencing any fluid symptoms.   Next ICM clinic phone appointment due: 05/06/2020 to recheck fluid levels.     Next 91 day remote transmission due: 06/24/2020.    Copy of ICM check sent to Dr. Johney Frame.   3 month ICM trend: 04/27/2020    1 Year ICM trend:     Karie Soda, RN 04/27/2020 4:41 PM

## 2020-04-28 NOTE — Progress Notes (Signed)
Spoke with patient.  He requested a call back regarding device battery life.  Advised the report has approximate date of 3.2-3.6 years left on battery. He was appreciative of the information.

## 2020-05-06 NOTE — Progress Notes (Signed)
No ICM remote transmission received for 05/06/2020 and next ICM transmission scheduled for 05/18/2020.

## 2020-05-18 ENCOUNTER — Ambulatory Visit (INDEPENDENT_AMBULATORY_CARE_PROVIDER_SITE_OTHER): Payer: Medicare Other

## 2020-05-18 DIAGNOSIS — Z95810 Presence of automatic (implantable) cardiac defibrillator: Secondary | ICD-10-CM

## 2020-05-18 DIAGNOSIS — I5022 Chronic systolic (congestive) heart failure: Secondary | ICD-10-CM

## 2020-05-20 NOTE — Progress Notes (Signed)
EPIC Encounter for ICM Monitoring  Patient Name: Gary [name] is a 69 y.o. male Date: 05/20/2020 Primary Care Physican: Jason Coop, FNP Primary Cardiologist:Cooper/Weaver PA Electrophysiologist: Allred 4/5/2021Weight: 118lbs                                                          Transmission reviewed.  Corvue thoracic impedance normal.   No diuretic prescribed.  Recommendations: Recommendation to limit salt intake to 2000 mg daily and fluid intake to 64 oz daily.  Encouraged to call if experiencing any fluid symptoms.   Next ICM clinic phone appointment due: 06/01/2020.     Next 91 day remote transmission due: 06/24/2020.  EP/Cardiology Office Visits: Recall 10/13/2020 with Dr. Excell Seltzer.  Last office visit with Dr Johney Frame was 08/08/2018.    Copy of ICM check sent to Dr. Johney Frame.   3 month ICM trend: 05/18/2020    1 Year ICM trend:       Karie Soda, RN 05/20/2020 2:01 PM

## 2020-06-01 ENCOUNTER — Ambulatory Visit (INDEPENDENT_AMBULATORY_CARE_PROVIDER_SITE_OTHER): Payer: Medicare Other

## 2020-06-01 DIAGNOSIS — I5022 Chronic systolic (congestive) heart failure: Secondary | ICD-10-CM | POA: Diagnosis not present

## 2020-06-01 DIAGNOSIS — Z95810 Presence of automatic (implantable) cardiac defibrillator: Secondary | ICD-10-CM | POA: Diagnosis not present

## 2020-06-01 NOTE — Progress Notes (Signed)
EPIC Encounter for ICM Monitoring  Patient Name: Gary [name] is a 69 y.o. male Date: 06/01/2020 Primary Care Physican: Jason Coop, FNP Primary Cardiologist:Cooper/Weaver PA Electrophysiologist: Allred 7/26/2021Weight: 118lbs  Spoke with patient and reports feeling well at this time.  Denies fluid symptoms.    Corvue thoracic impedance normal.  No diuretic prescribed.  Recommendations:Recommendation to limit salt intake to 2000 mg daily and fluid intake to 64 oz daily. Encouraged to call if experiencing any fluid symptoms.   Next ICM clinic phone appointment due:06/25/2020.Next 91 day remote transmission due: 06/24/2020.  EP/Cardiology Office Visits: Recall 10/13/2020 with Dr. Excell Seltzer.  Last office visit with Dr Johney Frame was 08/08/2018.    Copy of ICM check sent to Dr. Johney Frame.   3 month ICM trend: 06/01/2020    1 Year ICM trend:       Karie Soda, RN 06/01/2020 4:52 PM

## 2020-06-24 ENCOUNTER — Ambulatory Visit: Payer: Medicare HMO

## 2020-06-30 NOTE — Progress Notes (Unsigned)
No ICM remote transmission received for 06/25/2020 and next ICM transmission scheduled for 07/08/2020.

## 2020-07-10 NOTE — Progress Notes (Signed)
No ICM remote transmission received for 07/08/2020 and next ICM transmission scheduled for 07/21/2020.   

## 2020-07-15 ENCOUNTER — Ambulatory Visit (INDEPENDENT_AMBULATORY_CARE_PROVIDER_SITE_OTHER): Payer: Medicare Other

## 2020-07-15 DIAGNOSIS — I5022 Chronic systolic (congestive) heart failure: Secondary | ICD-10-CM

## 2020-07-15 DIAGNOSIS — Z95810 Presence of automatic (implantable) cardiac defibrillator: Secondary | ICD-10-CM

## 2020-07-15 LAB — CUP PACEART REMOTE DEVICE CHECK
Battery Remaining Longevity: 40 mo
Battery Remaining Percentage: 39 %
Battery Voltage: 2.87 V
Brady Statistic AP VP Percent: 1 %
Brady Statistic AP VS Percent: 1.8 %
Brady Statistic AS VP Percent: 1 %
Brady Statistic AS VS Percent: 98 %
Brady Statistic RA Percent Paced: 1.7 %
Brady Statistic RV Percent Paced: 1 %
Date Time Interrogation Session: 20210818020015
HighPow Impedance: 78 Ohm
HighPow Impedance: 78 Ohm
Implantable Lead Implant Date: 20150313
Implantable Lead Implant Date: 20150313
Implantable Lead Location: 753859
Implantable Lead Location: 753860
Implantable Pulse Generator Implant Date: 20150313
Lead Channel Impedance Value: 430 Ohm
Lead Channel Impedance Value: 450 Ohm
Lead Channel Pacing Threshold Amplitude: 0.75 V
Lead Channel Pacing Threshold Amplitude: 0.75 V
Lead Channel Pacing Threshold Pulse Width: 0.5 ms
Lead Channel Pacing Threshold Pulse Width: 0.5 ms
Lead Channel Sensing Intrinsic Amplitude: 11.8 mV
Lead Channel Sensing Intrinsic Amplitude: 3.3 mV
Lead Channel Setting Pacing Amplitude: 2 V
Lead Channel Setting Pacing Amplitude: 2.5 V
Lead Channel Setting Pacing Pulse Width: 0.5 ms
Lead Channel Setting Sensing Sensitivity: 0.5 mV
Pulse Gen Serial Number: 7142670

## 2020-07-15 NOTE — Progress Notes (Signed)
EPIC Encounter for ICM Monitoring  Patient Name: Gary [name] is a 69 y.o. male Date: 07/15/2020 Primary Care Physican: Jason Coop, FNP Primary Cardiologist:Cooper/Weaver PA Electrophysiologist: Allred 7/26/2021Weight: 118lbs  Spoke with patient and reports feeling well at this time.  Denies fluid symptoms.  He requested I contact his daughter Gary [name] to have her make office appointments for Dr Johney Frame and Dr Excell Seltzer.  Left message for daughter with office number to make appointments.  Corvue thoracic impedancenormal.  No diuretic prescribed.  Recommendations: Encouraged to call if experiencing any fluid symptoms.   Next ICM clinic phone appointment due:08/17/2020.Next 91 day remote transmission due: 09/23/2020.  EP/Cardiology Office Visits:Recall 10/13/2020 with Dr.Cooper.Last office visit with Dr Johney Frame was 08/08/2018.  Copy of ICM check sent to Dr.Allred.   3 month ICM trend: 07/15/2020    1 Year ICM trend:       Karie Soda, RN 07/15/2020 4:25 PM

## 2020-08-17 ENCOUNTER — Ambulatory Visit (INDEPENDENT_AMBULATORY_CARE_PROVIDER_SITE_OTHER): Payer: Medicare Other

## 2020-08-17 DIAGNOSIS — I5022 Chronic systolic (congestive) heart failure: Secondary | ICD-10-CM

## 2020-08-17 DIAGNOSIS — Z95810 Presence of automatic (implantable) cardiac defibrillator: Secondary | ICD-10-CM

## 2020-08-17 NOTE — Progress Notes (Signed)
EPIC Encounter for ICM Monitoring  Patient Name: Gary [name] is a 69 y.o. male Date: 08/17/2020 Primary Care Physican: Jason Coop, FNP Primary Cardiologist:Cooper/Weaver PA Electrophysiologist: Allred 10/11/2021Weight: 119lbs  Spoke with patient and reports feeling well at this time. Denies fluid symptoms.   Corvue thoracic impedancenormal.  No diuretic prescribed.  Recommendations: Encouraged to call if experiencing any fluid symptoms.   Next ICM clinic phone appointment due:09/24/2020.Next 91 day remote transmission due: 09/23/2020.  EP/Cardiology Office Visits:Recall 10/13/2020 with Dr.Cooper.Last office visit with Dr Johney Frame was 08/08/2018.  Copy of ICM check sent to Dr.Allred.   3 month ICM trend: 08/17/2020    1 Year ICM trend:       Karie Soda, RN 08/17/2020 2:08 PM

## 2020-09-23 ENCOUNTER — Ambulatory Visit (INDEPENDENT_AMBULATORY_CARE_PROVIDER_SITE_OTHER): Payer: Medicare Other

## 2020-09-23 DIAGNOSIS — I472 Ventricular tachycardia, unspecified: Secondary | ICD-10-CM

## 2020-09-23 LAB — CUP PACEART REMOTE DEVICE CHECK
Battery Remaining Longevity: 37 mo
Battery Remaining Percentage: 37 %
Battery Voltage: 2.87 V
Brady Statistic AP VP Percent: 1 %
Brady Statistic AP VS Percent: 1.5 %
Brady Statistic AS VP Percent: 1 %
Brady Statistic AS VS Percent: 98 %
Brady Statistic RA Percent Paced: 1.4 %
Brady Statistic RV Percent Paced: 1 %
Date Time Interrogation Session: 20211117020016
HighPow Impedance: 66 Ohm
HighPow Impedance: 66 Ohm
Implantable Lead Implant Date: 20150313
Implantable Lead Implant Date: 20150313
Implantable Lead Location: 753859
Implantable Lead Location: 753860
Implantable Pulse Generator Implant Date: 20150313
Lead Channel Impedance Value: 410 Ohm
Lead Channel Impedance Value: 430 Ohm
Lead Channel Pacing Threshold Amplitude: 0.75 V
Lead Channel Pacing Threshold Amplitude: 0.75 V
Lead Channel Pacing Threshold Pulse Width: 0.5 ms
Lead Channel Pacing Threshold Pulse Width: 0.5 ms
Lead Channel Sensing Intrinsic Amplitude: 11.8 mV
Lead Channel Sensing Intrinsic Amplitude: 5 mV
Lead Channel Setting Pacing Amplitude: 2 V
Lead Channel Setting Pacing Amplitude: 2.5 V
Lead Channel Setting Pacing Pulse Width: 0.5 ms
Lead Channel Setting Sensing Sensitivity: 0.5 mV
Pulse Gen Serial Number: 7142670

## 2020-09-25 ENCOUNTER — Ambulatory Visit (INDEPENDENT_AMBULATORY_CARE_PROVIDER_SITE_OTHER): Payer: Medicare Other

## 2020-09-25 DIAGNOSIS — I5022 Chronic systolic (congestive) heart failure: Secondary | ICD-10-CM

## 2020-09-25 DIAGNOSIS — Z95810 Presence of automatic (implantable) cardiac defibrillator: Secondary | ICD-10-CM

## 2020-09-25 NOTE — Progress Notes (Signed)
EPIC Encounter for ICM Monitoring  Patient Name: Gary [name] is a 69 y.o. male Date: 09/25/2020 Primary Care Physican: Jason Coop, FNP Primary Cardiologist:Cooper/Weaver PA Electrophysiologist: Allred 10/11/2021Weight: 119lbs  Attempted call to patient and unable to reach.  Transmission reviewed.   Corvue thoracic impedancenormal.  No diuretic prescribed.  Recommendations:Unable to reach.    Next ICM clinic phone appointment due:10/26/2020.Next 91 day remote transmission due:12/24/2020.  EP/Cardiology Office Visits:Recall 10/28/2020 with [name], PA.Last office visit with Francis Dowse, PA on 09/26/2019.Message sent to EP Scheduler to call daughter to make appointment.  Copy of ICM check sent to Dr.Allred.  3 month ICM trend: 09/23/2020    1 Year ICM trend:       Karie Soda, RN 09/25/2020 8:37 AM

## 2020-09-25 NOTE — Progress Notes (Signed)
Remote ICD transmission.   

## 2020-10-18 ENCOUNTER — Emergency Department (HOSPITAL_COMMUNITY): Payer: Medicare Other

## 2020-10-18 ENCOUNTER — Encounter (HOSPITAL_COMMUNITY): Payer: Self-pay | Admitting: Emergency Medicine

## 2020-10-18 ENCOUNTER — Inpatient Hospital Stay (HOSPITAL_COMMUNITY)
Admission: EM | Admit: 2020-10-18 | Discharge: 2020-10-28 | DRG: 064 | Disposition: A | Discharge status: Skilled Nursing Facility | Payer: Medicare Other | Attending: Internal Medicine | Admitting: Internal Medicine

## 2020-10-18 ENCOUNTER — Other Ambulatory Visit: Payer: Self-pay

## 2020-10-18 ENCOUNTER — Inpatient Hospital Stay (HOSPITAL_COMMUNITY): Payer: Medicare Other

## 2020-10-18 DIAGNOSIS — I452 Bifascicular block: Secondary | ICD-10-CM | POA: Diagnosis present

## 2020-10-18 DIAGNOSIS — I252 Old myocardial infarction: Secondary | ICD-10-CM

## 2020-10-18 DIAGNOSIS — I10 Essential (primary) hypertension: Secondary | ICD-10-CM | POA: Diagnosis not present

## 2020-10-18 DIAGNOSIS — Z95810 Presence of automatic (implantable) cardiac defibrillator: Secondary | ICD-10-CM | POA: Diagnosis not present

## 2020-10-18 DIAGNOSIS — E785 Hyperlipidemia, unspecified: Secondary | ICD-10-CM | POA: Diagnosis present

## 2020-10-18 DIAGNOSIS — I081 Rheumatic disorders of both mitral and tricuspid valves: Secondary | ICD-10-CM | POA: Diagnosis present

## 2020-10-18 DIAGNOSIS — S40212A Abrasion of left shoulder, initial encounter: Secondary | ICD-10-CM | POA: Diagnosis present

## 2020-10-18 DIAGNOSIS — Z681 Body mass index (BMI) 19 or less, adult: Secondary | ICD-10-CM

## 2020-10-18 DIAGNOSIS — E7800 Pure hypercholesterolemia, unspecified: Secondary | ICD-10-CM | POA: Diagnosis present

## 2020-10-18 DIAGNOSIS — M6282 Rhabdomyolysis: Secondary | ICD-10-CM | POA: Diagnosis present

## 2020-10-18 DIAGNOSIS — K225 Diverticulum of esophagus, acquired: Secondary | ICD-10-CM | POA: Diagnosis present

## 2020-10-18 DIAGNOSIS — E870 Hyperosmolality and hypernatremia: Secondary | ICD-10-CM | POA: Diagnosis not present

## 2020-10-18 DIAGNOSIS — F32A Depression, unspecified: Secondary | ICD-10-CM | POA: Diagnosis present

## 2020-10-18 DIAGNOSIS — I63411 Cerebral infarction due to embolism of right middle cerebral artery: Secondary | ICD-10-CM | POA: Diagnosis not present

## 2020-10-18 DIAGNOSIS — I2510 Atherosclerotic heart disease of native coronary artery without angina pectoris: Secondary | ICD-10-CM

## 2020-10-18 DIAGNOSIS — R1310 Dysphagia, unspecified: Secondary | ICD-10-CM | POA: Diagnosis present

## 2020-10-18 DIAGNOSIS — Z950 Presence of cardiac pacemaker: Secondary | ICD-10-CM

## 2020-10-18 DIAGNOSIS — Z20822 Contact with and (suspected) exposure to covid-19: Secondary | ICD-10-CM | POA: Diagnosis present

## 2020-10-18 DIAGNOSIS — I480 Paroxysmal atrial fibrillation: Secondary | ICD-10-CM | POA: Diagnosis present

## 2020-10-18 DIAGNOSIS — Z8249 Family history of ischemic heart disease and other diseases of the circulatory system: Secondary | ICD-10-CM

## 2020-10-18 DIAGNOSIS — J439 Emphysema, unspecified: Secondary | ICD-10-CM | POA: Diagnosis present

## 2020-10-18 DIAGNOSIS — J9811 Atelectasis: Secondary | ICD-10-CM | POA: Diagnosis not present

## 2020-10-18 DIAGNOSIS — Z79899 Other long term (current) drug therapy: Secondary | ICD-10-CM

## 2020-10-18 DIAGNOSIS — I639 Cerebral infarction, unspecified: Secondary | ICD-10-CM | POA: Diagnosis not present

## 2020-10-18 DIAGNOSIS — I63511 Cerebral infarction due to unspecified occlusion or stenosis of right middle cerebral artery: Secondary | ICD-10-CM

## 2020-10-18 DIAGNOSIS — I5042 Chronic combined systolic (congestive) and diastolic (congestive) heart failure: Secondary | ICD-10-CM | POA: Diagnosis present

## 2020-10-18 DIAGNOSIS — G8194 Hemiplegia, unspecified affecting left nondominant side: Secondary | ICD-10-CM | POA: Diagnosis present

## 2020-10-18 DIAGNOSIS — R680 Hypothermia, not associated with low environmental temperature: Secondary | ICD-10-CM | POA: Diagnosis present

## 2020-10-18 DIAGNOSIS — Z888 Allergy status to other drugs, medicaments and biological substances status: Secondary | ICD-10-CM

## 2020-10-18 DIAGNOSIS — R471 Dysarthria and anarthria: Secondary | ICD-10-CM | POA: Diagnosis present

## 2020-10-18 DIAGNOSIS — G9340 Encephalopathy, unspecified: Secondary | ICD-10-CM

## 2020-10-18 DIAGNOSIS — R29714 NIHSS score 14: Secondary | ICD-10-CM | POA: Diagnosis present

## 2020-10-18 DIAGNOSIS — L89896 Pressure-induced deep tissue damage of other site: Secondary | ICD-10-CM | POA: Diagnosis present

## 2020-10-18 DIAGNOSIS — Z66 Do not resuscitate: Secondary | ICD-10-CM | POA: Diagnosis present

## 2020-10-18 DIAGNOSIS — I255 Ischemic cardiomyopathy: Secondary | ICD-10-CM | POA: Diagnosis present

## 2020-10-18 DIAGNOSIS — R29810 Facial weakness: Secondary | ICD-10-CM | POA: Diagnosis present

## 2020-10-18 DIAGNOSIS — G9349 Other encephalopathy: Secondary | ICD-10-CM | POA: Diagnosis present

## 2020-10-18 DIAGNOSIS — I6389 Other cerebral infarction: Secondary | ICD-10-CM | POA: Diagnosis not present

## 2020-10-18 DIAGNOSIS — I110 Hypertensive heart disease with heart failure: Secondary | ICD-10-CM | POA: Diagnosis present

## 2020-10-18 DIAGNOSIS — F419 Anxiety disorder, unspecified: Secondary | ICD-10-CM | POA: Diagnosis present

## 2020-10-18 DIAGNOSIS — E43 Unspecified severe protein-calorie malnutrition: Secondary | ICD-10-CM | POA: Diagnosis present

## 2020-10-18 DIAGNOSIS — J9601 Acute respiratory failure with hypoxia: Secondary | ICD-10-CM | POA: Diagnosis not present

## 2020-10-18 DIAGNOSIS — L89226 Pressure-induced deep tissue damage of left hip: Secondary | ICD-10-CM | POA: Diagnosis present

## 2020-10-18 DIAGNOSIS — Z9861 Coronary angioplasty status: Secondary | ICD-10-CM

## 2020-10-18 DIAGNOSIS — I700 Atherosclerosis of aorta: Secondary | ICD-10-CM | POA: Diagnosis present

## 2020-10-18 DIAGNOSIS — E876 Hypokalemia: Secondary | ICD-10-CM | POA: Diagnosis not present

## 2020-10-18 DIAGNOSIS — Z981 Arthrodesis status: Secondary | ICD-10-CM

## 2020-10-18 DIAGNOSIS — M1990 Unspecified osteoarthritis, unspecified site: Secondary | ICD-10-CM | POA: Diagnosis present

## 2020-10-18 DIAGNOSIS — R0902 Hypoxemia: Secondary | ICD-10-CM

## 2020-10-18 DIAGNOSIS — Z7982 Long term (current) use of aspirin: Secondary | ICD-10-CM

## 2020-10-18 DIAGNOSIS — R4182 Altered mental status, unspecified: Secondary | ICD-10-CM | POA: Diagnosis not present

## 2020-10-18 DIAGNOSIS — D72829 Elevated white blood cell count, unspecified: Secondary | ICD-10-CM | POA: Diagnosis not present

## 2020-10-18 DIAGNOSIS — Z885 Allergy status to narcotic agent status: Secondary | ICD-10-CM

## 2020-10-18 DIAGNOSIS — G936 Cerebral edema: Secondary | ICD-10-CM | POA: Diagnosis not present

## 2020-10-18 DIAGNOSIS — Z720 Tobacco use: Secondary | ICD-10-CM | POA: Diagnosis present

## 2020-10-18 LAB — DIFFERENTIAL
Abs Immature Granulocytes: 0.12 10*3/uL — ABNORMAL HIGH (ref 0.00–0.07)
Basophils Absolute: 0.1 10*3/uL (ref 0.0–0.1)
Basophils Relative: 0 %
Eosinophils Absolute: 0 10*3/uL (ref 0.0–0.5)
Eosinophils Relative: 0 %
Immature Granulocytes: 1 %
Lymphocytes Relative: 5 %
Lymphs Abs: 1 10*3/uL (ref 0.7–4.0)
Monocytes Absolute: 2.1 10*3/uL — ABNORMAL HIGH (ref 0.1–1.0)
Monocytes Relative: 10 %
Neutro Abs: 17.2 10*3/uL — ABNORMAL HIGH (ref 1.7–7.7)
Neutrophils Relative %: 84 %

## 2020-10-18 LAB — PROTIME-INR
INR: 1.1 (ref 0.8–1.2)
Prothrombin Time: 13.4 seconds (ref 11.4–15.2)

## 2020-10-18 LAB — BLOOD GAS, ARTERIAL
Acid-base deficit: 1.2 mmol/L (ref 0.0–2.0)
Bicarbonate: 23.7 mmol/L (ref 20.0–28.0)
FIO2: 36
O2 Saturation: 97.1 %
Patient temperature: 37.1
pCO2 arterial: 36.1 mmHg (ref 32.0–48.0)
pH, Arterial: 7.416 (ref 7.350–7.450)
pO2, Arterial: 101 mmHg (ref 83.0–108.0)

## 2020-10-18 LAB — COMPREHENSIVE METABOLIC PANEL
ALT: 33 U/L (ref 0–44)
AST: 50 U/L — ABNORMAL HIGH (ref 15–41)
Albumin: 3.6 g/dL (ref 3.5–5.0)
Alkaline Phosphatase: 64 U/L (ref 38–126)
Anion gap: 12 (ref 5–15)
BUN: 63 mg/dL — ABNORMAL HIGH (ref 8–23)
CO2: 21 mmol/L — ABNORMAL LOW (ref 22–32)
Calcium: 8.9 mg/dL (ref 8.9–10.3)
Chloride: 104 mmol/L (ref 98–111)
Creatinine, Ser: 0.76 mg/dL (ref 0.61–1.24)
GFR, Estimated: >60 mL/min (ref >60)
Glucose, Bld: 139 mg/dL — ABNORMAL HIGH (ref 70–99)
Potassium: 3.7 mmol/L (ref 3.5–5.1)
Sodium: 137 mmol/L (ref 135–145)
Total Bilirubin: 1.4 mg/dL — ABNORMAL HIGH (ref 0.3–1.2)
Total Protein: 6.8 g/dL (ref 6.5–8.1)

## 2020-10-18 LAB — CBC
HCT: 49.3 % (ref 39.0–52.0)
Hemoglobin: 16.8 g/dL (ref 13.0–17.0)
MCH: 31.9 pg (ref 26.0–34.0)
MCHC: 34.1 g/dL (ref 30.0–36.0)
MCV: 93.5 fL (ref 80.0–100.0)
Platelets: 180 10*3/uL (ref 150–400)
RBC: 5.27 MIL/uL (ref 4.22–5.81)
RDW: 13.5 % (ref 11.5–15.5)
WBC: 20.4 10*3/uL — ABNORMAL HIGH (ref 4.0–10.5)
nRBC: 0 % (ref 0.0–0.2)

## 2020-10-18 LAB — BASIC METABOLIC PANEL
Anion gap: 10 (ref 5–15)
BUN: 58 mg/dL — ABNORMAL HIGH (ref 8–23)
CO2: 23 mmol/L (ref 22–32)
Calcium: 8.5 mg/dL — ABNORMAL LOW (ref 8.9–10.3)
Chloride: 107 mmol/L (ref 98–111)
Creatinine, Ser: 0.76 mg/dL (ref 0.61–1.24)
GFR, Estimated: >60 mL/min (ref >60)
Glucose, Bld: 133 mg/dL — ABNORMAL HIGH (ref 70–99)
Potassium: 3.2 mmol/L — ABNORMAL LOW (ref 3.5–5.1)
Sodium: 140 mmol/L (ref 135–145)

## 2020-10-18 LAB — RESP PANEL BY RT-PCR (FLU A&B, COVID) ARPGX2
Influenza A by PCR: NEGATIVE
Influenza B by PCR: NEGATIVE
SARS Coronavirus 2 by RT PCR: NEGATIVE

## 2020-10-18 LAB — APTT: aPTT: 21 seconds — ABNORMAL LOW (ref 24–36)

## 2020-10-18 LAB — LACTIC ACID, PLASMA
Lactic Acid, Venous: 2.7 mmol/L (ref 0.5–1.9)
Lactic Acid, Venous: 1.6 mmol/L (ref 0.5–1.9)

## 2020-10-18 LAB — ETHANOL: Alcohol, Ethyl (B): <10 mg/dL (ref <10)

## 2020-10-18 LAB — CK: Total CK: 1905 U/L — ABNORMAL HIGH (ref 49–397)

## 2020-10-18 MED ORDER — LORAZEPAM 2 MG/ML IJ SOLN
0.5000 mg | Freq: Once | INTRAMUSCULAR | Status: AC
  Administered 2020-10-18: 0.5 mg via INTRAVENOUS
  Filled 2020-10-18: qty 1

## 2020-10-18 MED ORDER — HYDRALAZINE HCL 20 MG/ML IJ SOLN: 10.0000 mg | INTRAMUSCULAR | Status: DC | PRN

## 2020-10-18 MED ORDER — ASPIRIN 300 MG RE SUPP
300.0000 mg | Freq: Once | RECTAL | Status: AC
  Administered 2020-10-18: 300 mg via RECTAL
  Filled 2020-10-18: qty 1

## 2020-10-18 MED ORDER — SODIUM CHLORIDE 0.9 % IV BOLUS
1000.0000 mL | Freq: Once | INTRAVENOUS | Status: AC
  Administered 2020-10-18: 1000 mL via INTRAVENOUS

## 2020-10-18 MED ORDER — SODIUM CHLORIDE 3 % IV BOLUS
75.0000 mL | Freq: Once | INTRAVENOUS | Status: AC
  Administered 2020-10-18: 75 mL via INTRAVENOUS
  Filled 2020-10-18: qty 75

## 2020-10-18 MED ORDER — SODIUM CHLORIDE 3 % IV SOLN
INTRAVENOUS | Status: AC
  Filled 2020-10-18: qty 500

## 2020-10-18 MED ORDER — SODIUM CHLORIDE 0.9 % IV SOLN
INTRAVENOUS | Status: DC
  Administered 2020-10-18: 1000 mL via INTRAVENOUS

## 2020-10-18 MED ORDER — SODIUM CHLORIDE 3 % IV SOLN
INTRAVENOUS | Status: DC
  Filled 2020-10-18 (×3): qty 500

## 2020-10-18 NOTE — ED Notes (Signed)
To CT

## 2020-10-18 NOTE — ED Notes (Signed)
sats have decreased  Unable to ascertain if it is cool hands or connection w monitor  P ox changed with sats to 80 on 4L O2  Request for resp to eval

## 2020-10-18 NOTE — ED Triage Notes (Signed)
Pt does know last well as they do  Not check on regularly  [place]ives [place]   Today family went to check   Had to kick in door found on the floor   EMS attempted 2 IVs without success

## 2020-10-18 NOTE — ED Notes (Signed)
Daughters   1. Claudean Severance [phone]  2. Crystal [phone]  3. Sander Nephew [phone]

## 2020-10-18 NOTE — ED Notes (Signed)
Call from [place] , [place] [facility] who inquires if pt will need further 3 per cent hypotonic solution and where pt should go   She is informed that this was a one time dose and neurologist has determined where pt shoulfd go   She reports she is trying to determine if pt needs cardiac unit or ICU  She is encouraged to call the admitting physician   Dr Hyacinth Meeker EDP is informed and reports that indeed then hypotonic infusion was a one time dose as aforementioned to [place], [place]
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
## 2020-10-18 NOTE — ED Provider Notes (Signed)
This patient is getting 3% hypertonic saline, the patient needs ICU level care, hospitalist has discussed with ICU, ICU has accepted and request ER to ER transfer which I think is appropriate.  I discussed care with Dr. Bebe Shaggy who has accepted this patient   Eber Hong, MD 10/18/20 [phone]

## 2020-10-18 NOTE — ED Notes (Signed)
CT shows large stroke  Neuro called   Tele neuro on and has been paged

## 2020-10-18 NOTE — ED Notes (Signed)
Decision that pt needs to be at [facility]   Per bed control no bed available   Awaiting bed placement from bed control

## 2020-10-18 NOTE — ED Notes (Signed)
Pt with pressure injuries to L upper arm, left upper chest, R inner knee Stage 3  Also mottled skin to L lateral upper body   He was cleaned upon admission due to strong smell of urine   He was found on floor with unknown time down  He lives alone  He has a R facial droop as well as slurred speech   He has a fixed gaze to R  Does not track finger movement

## 2020-10-18 NOTE — ED Provider Notes (Signed)
  Face-to-face evaluation   History: Patient presents from his skilled nursing facility for evaluation of altered mental status and fever.  He is unable to give any history.  According to his wife he looked a little abnormal when she last saw him about a week ago.  Physical exam: Elderly man who is lying on the left side, with his knees flexed and does not open his eyes.  Abdomen is soft and nontender to palpation.  Skin is cool.  No respiratory distress.  No tachycardia.  Medical screening examination/treatment/procedure(s) were conducted as a shared visit with non-physician practitioner(s) and myself.  I personally evaluated the patient during the encounter   .Critical Care Performed by: Mancel Bale, MD Authorized by: Mancel Bale, MD   Critical care provider statement:    Critical care time (minutes):  35   Critical care start time:  10/18/2020 2:01 PM   Critical care end time:  10/18/2020 3:50 PM   Critical care time was exclusive of:  Separately billable procedures and treating other patients   Critical care was necessary to treat or prevent imminent or life-threatening deterioration of the following conditions:  CNS failure or compromise   Critical care was time spent personally by me on the following activities:  Blood draw for specimens, development of treatment plan with patient or surrogate, discussions with consultants, evaluation of patient's response to treatment, examination of patient, obtaining history from patient or surrogate, ordering and performing treatments and interventions, ordering and review of laboratory studies, pulse oximetry, re-evaluation of patient's condition, review of old charts and ordering and review of radiographic studies      Mancel Bale, MD 10/20/20 1102

## 2020-10-18 NOTE — ED Notes (Signed)
Pt from Rad

## 2020-10-18 NOTE — ED Notes (Signed)
resp eval putting on high flow O2 w mask   Pt work of breathing appears weaker   He has told Pa in witness with this RN he does not wish to be resuscitated  His 3 daughters are at bedside   Mr Gary [name], and Dr Hyacinth Meeker have reevaled

## 2020-10-18 NOTE — ED Notes (Signed)
Multiple warm blankets to pt while awaiting bear hugger   Warm fluids going   Due to temp unable to ascertain if low POx is related  To low body temp vs hypoxia   O2 [place] 2L application

## 2020-10-18 NOTE — ED Notes (Signed)
Pt to Rad   Decision to send to [facility]

## 2020-10-18 NOTE — ED Notes (Addendum)
Order noted for more 3 per cent hypotonic solution   Awaiting if bed status and if it will change

## 2020-10-18 NOTE — ED Notes (Signed)
Dr Bea Laura messaged to request pt stay in ED for her eval to ascertain if pt needs higher level of care then [facility] and pt should go to [facility]

## 2020-10-18 NOTE — ED Notes (Signed)
Date and time results received: 10/18/20 1544 (use smartphrase ".now" to insert current time)  Test:lactic acid Critical Value: 2.7  Name of Provider Notified: Effie Shy  Orders Received? Or Actions Taken?:fluids

## 2020-10-18 NOTE — ED Notes (Signed)
Dr Allena Katz neuro eval and discussed with family   [facility] defibrillator model # [id number]  Serial Number [id number]  Implant 10/08/13
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
## 2020-10-18 NOTE — Consult Note (Signed)
[facility] TeleNeurology Consult Services  Stat Consult  Date of Service:   10/18/2020 16:12:02  Diagnosis:     .  I63.411 - Cerebrovascular accident (CVA) due to embolism of right middle cerebral artery (HCCC)  Impression: Pt is a 19 YOM with PMH of HTN, HLD, ANXIETY/DEPRESSION, CAD, A.FIB not on [facility] who presented with AMS/left sided weakness from home. NIHSS: 14. He had large right MCA hypodensity on CT. Based on history from family he is outside any intervention window. Admit for stroke workup.  Our recommendations are outlined below.  Diagnostic Studies: Recommend MRI brain without contrast (Repeat CT Head without contrast at 24 hours if device not MRI compatible). Routine CTA head and neck visualization of vasculature Transthoracic Echo with bubble study, if available  Laboratory Studies: Recommend Lipid panel Hemoglobin A1c  Medication: Aspirin (can consider AC in 10-14 days for A.fib) Statins for LDL goal 70  Nursing Recommendations: Telemetry, IV Fluids, avoid dextrose containing fluids, Maintain euglycemia Neuro checks q4 hrs x 24 hrs and then per shift Head of bed 30 degrees  Consultations: Recommend Speech therapy if failed dysphagia screen Physical therapy/Occupational therapy  DVT Prophylaxis: Choice of Primary Team SCDs, Pneumatic Compression  Disposition: Neurology will follow  CT HEAD: 1. There is a large, hemispheric hypodensity of the right frontal and parietal lobes involving almost the entire right MCA territory with associated sulcal effacement. There is minimal mass effect on the right lateral ventricle without significant midline shift.Findings are consistent with late acute to subacute right MCA territory infarction. No evidence of hemorrhage. 2. Underlying small-vessel white matter disease.  Metrics: [facility] Notification Time: 10/18/2020 16:09:44 Stamp Time: 10/18/2020 16:12:02 Callback Response Time: 10/18/2020  16:15:33   ----------------------------------------------------------------------------------------------------  Chief Complaint: AMS, left sided weakness  History of Present Illness: Patient is a 69 year old Male.  Pt is a 39 YOM with PMH of HTN, HLD, ANXIETY/DEPRESSION, CAD, A.FIB not on [facility] who presented with AMS from home. Last time any family saw him normal was Wednesday/Thursday. His family went to check on him and found him on floor on left side with significant skin breakdown, altered, and significant left sided weakness. He had history of April fibrillation is but not anticoagulated. he also has a defibrillator but unsure if I'm MRI compatible or not (family has card). He smoked 1pk/day.   Past Medical History:     . Hypertension     . Hyperlipidemia     . Atrial Fibrillation     . Coronary Artery Disease     . There is NO history of Diabetes Mellitus     . There is NO history of Stroke  Anticoagulant use:  No  Antiplatelet use: Yes ASA   Examination: BP(123/70), Pulse(63), Blood Glucose(p) 1A: Level of Consciousness - Alert; keenly responsive + 0 1B: Ask Month and Age - Both Questions Right + 0 1C: Blink Eyes & Squeeze Hands - Performs Both Tasks + 0 2: Test Horizontal Extraocular Movements - Normal + 0 3: Test Visual Fields - No Visual Loss + 0 4: Test Facial Palsy (Use Grimace if Obtunded) - Partial paralysis (lower face) + 2 5A: Test Left Arm Motor Drift - No Movement + 4 5B: Test Right Arm Motor Drift - No Drift for 10 Seconds + 0 6A: Test Left Leg Motor Drift - No Movement + 4 6B: Test Right Leg Motor Drift - No Drift for 5 Seconds + 0 7: Test Limb Ataxia (FNF/Heel-Shin) - No Ataxia + 0 8: Test Sensation -  Complete Loss: Cannot Sense Being Touched At All + 2 9: Test Language/Aphasia - Normal; No aphasia + 0 10: Test Dysarthria - Mild-Moderate Dysarthria: Slurring but can be understood + 1 11: Test Extinction/Inattention - Extinction to bilateral simultaneous  stimulation + 1  NIHSS Score: 14   Patient / Family was informed the Neurology Consult would occur via TeleHealth consult by way of interactive audio and video telecommunications and consented to receiving care in this manner.  Patient is being evaluated for possible acute neurologic impairment and high probability of imminent or life - threatening deterioration.I spent total of 40 minutes providing care to this patient, including time for face to face visit via telemedicine, review of medical records, imaging studies and discussion of findings with providers, the patient and / or family.   Dr Marcene Corning   [facility] [phone]  Case [id number]

## 2020-10-18 NOTE — ED Notes (Signed)
Dr E at bedside   Daughters x 3 at bedside   Awaiting placement decision

## 2020-10-19 ENCOUNTER — Other Ambulatory Visit (HOSPITAL_COMMUNITY): Payer: Medicare Other

## 2020-10-19 ENCOUNTER — Inpatient Hospital Stay (HOSPITAL_COMMUNITY): Payer: Medicare Other

## 2020-10-19 ENCOUNTER — Encounter (HOSPITAL_COMMUNITY): Payer: Self-pay | Admitting: Internal Medicine

## 2020-10-19 ENCOUNTER — Other Ambulatory Visit: Payer: Self-pay

## 2020-10-19 DIAGNOSIS — G9340 Encephalopathy, unspecified: Secondary | ICD-10-CM

## 2020-10-19 DIAGNOSIS — G936 Cerebral edema: Secondary | ICD-10-CM

## 2020-10-19 DIAGNOSIS — I639 Cerebral infarction, unspecified: Secondary | ICD-10-CM

## 2020-10-19 DIAGNOSIS — I63511 Cerebral infarction due to unspecified occlusion or stenosis of right middle cerebral artery: Secondary | ICD-10-CM

## 2020-10-19 LAB — URINALYSIS, ROUTINE W REFLEX MICROSCOPIC
Bilirubin Urine: NEGATIVE
Glucose, UA: NEGATIVE mg/dL
Ketones, ur: 5 mg/dL — AB
Leukocytes,Ua: NEGATIVE
Nitrite: NEGATIVE
Protein, ur: NEGATIVE mg/dL
Specific Gravity, Urine: 1.044 — ABNORMAL HIGH (ref 1.005–1.030)
pH: 5 (ref 5.0–8.0)

## 2020-10-19 LAB — CBC
HCT: 47.1 % (ref 39.0–52.0)
Hemoglobin: 15.3 g/dL (ref 13.0–17.0)
MCH: 31.3 pg (ref 26.0–34.0)
MCHC: 32.5 g/dL (ref 30.0–36.0)
MCV: 96.3 fL (ref 80.0–100.0)
Platelets: 186 10*3/uL (ref 150–400)
RBC: 4.89 MIL/uL (ref 4.22–5.81)
RDW: 13.9 % (ref 11.5–15.5)
WBC: 14 10*3/uL — ABNORMAL HIGH (ref 4.0–10.5)
nRBC: 0 % (ref 0.0–0.2)

## 2020-10-19 LAB — CK: Total CK: 1076 U/L — ABNORMAL HIGH (ref 49–397)

## 2020-10-19 LAB — MAGNESIUM
Magnesium: 1.9 mg/dL (ref 1.7–2.4)
Magnesium: 2 mg/dL (ref 1.7–2.4)

## 2020-10-19 LAB — BASIC METABOLIC PANEL
Anion gap: 10 (ref 5–15)
Anion gap: 11 (ref 5–15)
Anion gap: 13 (ref 5–15)
Anion gap: 14 (ref 5–15)
BUN: 46 mg/dL — ABNORMAL HIGH (ref 8–23)
BUN: 48 mg/dL — ABNORMAL HIGH (ref 8–23)
BUN: 52 mg/dL — ABNORMAL HIGH (ref 8–23)
BUN: 58 mg/dL — ABNORMAL HIGH (ref 8–23)
CO2: 23 mmol/L (ref 22–32)
CO2: 24 mmol/L (ref 22–32)
CO2: 25 mmol/L (ref 22–32)
CO2: 25 mmol/L (ref 22–32)
Calcium: 8.8 mg/dL — ABNORMAL LOW (ref 8.9–10.3)
Calcium: 8.8 mg/dL — ABNORMAL LOW (ref 8.9–10.3)
Calcium: 9 mg/dL (ref 8.9–10.3)
Calcium: 9.2 mg/dL (ref 8.9–10.3)
Chloride: 108 mmol/L (ref 98–111)
Chloride: 111 mmol/L (ref 98–111)
Chloride: 113 mmol/L — ABNORMAL HIGH (ref 98–111)
Chloride: 115 mmol/L — ABNORMAL HIGH (ref 98–111)
Creatinine, Ser: 0.82 mg/dL (ref 0.61–1.24)
Creatinine, Ser: 0.85 mg/dL (ref 0.61–1.24)
Creatinine, Ser: 0.89 mg/dL (ref 0.61–1.24)
Creatinine, Ser: 0.97 mg/dL (ref 0.61–1.24)
GFR, Estimated: >60 mL/min (ref >60)
GFR, Estimated: >60 mL/min (ref >60)
GFR, Estimated: >60 mL/min (ref >60)
GFR, Estimated: >60 mL/min (ref >60)
Glucose, Bld: 112 mg/dL — ABNORMAL HIGH (ref 70–99)
Glucose, Bld: 122 mg/dL — ABNORMAL HIGH (ref 70–99)
Glucose, Bld: 128 mg/dL — ABNORMAL HIGH (ref 70–99)
Glucose, Bld: 186 mg/dL — ABNORMAL HIGH (ref 70–99)
Potassium: 3.3 mmol/L — ABNORMAL LOW (ref 3.5–5.1)
Potassium: 3.7 mmol/L (ref 3.5–5.1)
Potassium: 3.7 mmol/L (ref 3.5–5.1)
Potassium: 4.4 mmol/L (ref 3.5–5.1)
Sodium: 144 mmol/L (ref 135–145)
Sodium: 149 mmol/L — ABNORMAL HIGH (ref 135–145)
Sodium: 149 mmol/L — ABNORMAL HIGH (ref 135–145)
Sodium: 150 mmol/L — ABNORMAL HIGH (ref 135–145)

## 2020-10-19 LAB — LIPID PANEL
Cholesterol: 248 mg/dL — ABNORMAL HIGH (ref 0–200)
HDL: 27 mg/dL — ABNORMAL LOW (ref >40)
LDL Cholesterol: 190 mg/dL — ABNORMAL HIGH (ref 0–99)
Total CHOL/HDL Ratio: 9.2 RATIO
Triglycerides: 153 mg/dL — ABNORMAL HIGH (ref <150)
VLDL: 31 mg/dL (ref 0–40)

## 2020-10-19 LAB — RAPID URINE DRUG SCREEN, HOSP PERFORMED
Amphetamines: NOT DETECTED
Barbiturates: NOT DETECTED
Benzodiazepines: NOT DETECTED
Cocaine: NOT DETECTED
Opiates: NOT DETECTED
Tetrahydrocannabinol: NOT DETECTED

## 2020-10-19 LAB — PHOSPHORUS
Phosphorus: 2.2 mg/dL — ABNORMAL LOW (ref 2.5–4.6)
Phosphorus: 2.8 mg/dL (ref 2.5–4.6)

## 2020-10-19 LAB — HEMOGLOBIN A1C
Hgb A1c MFr Bld: 5.9 % — ABNORMAL HIGH (ref 4.8–5.6)
Mean Plasma Glucose: 122.63 mg/dL

## 2020-10-19 LAB — MRSA PCR SCREENING: MRSA by PCR: NEGATIVE

## 2020-10-19 LAB — HIV ANTIBODY (ROUTINE TESTING W REFLEX): HIV Screen 4th Generation wRfx: NONREACTIVE

## 2020-10-19 MED ORDER — STROKE: EARLY STAGES OF RECOVERY BOOK
Freq: Once | Status: AC
  Filled 2020-10-19: qty 1

## 2020-10-19 MED ORDER — ORAL CARE MOUTH RINSE
15.0000 mL | Freq: Two times a day (BID) | OROMUCOSAL | Status: DC
  Administered 2020-10-19 – 2020-10-28 (×14): 15 mL via OROMUCOSAL

## 2020-10-19 MED ORDER — CHLORHEXIDINE GLUCONATE CLOTH 2 % EX PADS: 6.0000 | MEDICATED_PAD | Freq: Every day | CUTANEOUS | Status: DC

## 2020-10-19 MED ORDER — ADULT MULTIVITAMIN W/MINERALS CH: 1.0000 | ORAL_TABLET | Freq: Every day | ORAL | Status: DC

## 2020-10-19 MED ORDER — POTASSIUM PHOSPHATES 15 MMOLE/5ML IV SOLN
15.0000 mmol | Freq: Once | INTRAVENOUS | Status: AC
  Administered 2020-10-19: 15 mmol via INTRAVENOUS
  Filled 2020-10-19: qty 5

## 2020-10-19 MED ORDER — ACETAMINOPHEN 160 MG/5ML PO SOLN
650.0000 mg | ORAL | Status: DC | PRN
Start: 2020-10-18 — End: 2020-10-23
  Administered 2020-10-19 – 2020-10-22 (×8): 650 mg
  Filled 2020-10-19 (×8): qty 20.3

## 2020-10-19 MED ORDER — ADULT MULTIVITAMIN LIQUID CH
15.0000 mL | Freq: Every day | ORAL | Status: DC
  Administered 2020-10-20 – 2020-10-22 (×3): 15 mL
  Filled 2020-10-19 (×4): qty 15

## 2020-10-19 MED ORDER — POTASSIUM CHLORIDE 10 MEQ/100ML IV SOLN
10.0000 meq | INTRAVENOUS | Status: AC
  Administered 2020-10-19 (×4): 10 meq via INTRAVENOUS
  Filled 2020-10-19 (×6): qty 100

## 2020-10-19 MED ORDER — ENOXAPARIN SODIUM 40 MG/0.4ML ~~LOC~~ SOLN
40.0000 mg | SUBCUTANEOUS | Status: DC
  Administered 2020-10-19 – 2020-10-28 (×10): 40 mg via SUBCUTANEOUS
  Filled 2020-10-19 (×10): qty 0.4

## 2020-10-19 MED ORDER — OSMOLITE 1.5 CAL PO LIQD
1000.0000 mL | ORAL | Status: DC
  Administered 2020-10-19 – 2020-10-22 (×3): 1000 mL

## 2020-10-19 MED ORDER — VITAL HIGH PROTEIN PO LIQD: 1000.0000 mL | ORAL | Status: DC

## 2020-10-19 MED ORDER — CHLORHEXIDINE GLUCONATE CLOTH 2 % EX PADS
6.0000 | MEDICATED_PAD | Freq: Every day | CUTANEOUS | Status: DC
  Administered 2020-10-21 – 2020-10-24 (×4): 6 via TOPICAL

## 2020-10-19 MED ORDER — ADULT MULTIVITAMIN LIQUID CH
15.0000 mL | Freq: Every day | ORAL | Status: DC
  Filled 2020-10-19: qty 15

## 2020-10-19 MED ORDER — ASPIRIN EC 325 MG PO TBEC
325.0000 mg | DELAYED_RELEASE_TABLET | Freq: Every day | ORAL | Status: DC
  Administered 2020-10-20: 325 mg via ORAL
  Filled 2020-10-19: qty 1

## 2020-10-19 MED ORDER — ACETAMINOPHEN 650 MG RE SUPP: 650.0000 mg | RECTAL | Status: DC | PRN

## 2020-10-19 MED ORDER — ASPIRIN 300 MG RE SUPP
300.0000 mg | Freq: Every day | RECTAL | Status: DC
  Administered 2020-10-19: 300 mg via RECTAL
  Filled 2020-10-19: qty 1

## 2020-10-19 MED ORDER — IPRATROPIUM-ALBUTEROL 0.5-2.5 (3) MG/3ML IN SOLN
3.0000 mL | Freq: Four times a day (QID) | RESPIRATORY_TRACT | Status: DC
  Administered 2020-10-20 – 2020-10-21 (×5): 3 mL via RESPIRATORY_TRACT
  Filled 2020-10-19 (×5): qty 3

## 2020-10-19 MED ORDER — ACETAMINOPHEN 325 MG PO TABS: 650.0000 mg | ORAL_TABLET | ORAL | Status: DC | PRN

## 2020-10-19 MED ORDER — IOHEXOL 350 MG/ML SOLN
75.0000 mL | Freq: Once | INTRAVENOUS | Status: AC | PRN
  Administered 2020-10-19: 75 mL via INTRAVENOUS

## 2020-10-19 MED ORDER — PROSOURCE TF PO LIQD
45.0000 mL | Freq: Two times a day (BID) | ORAL | Status: DC
  Administered 2020-10-19 – 2020-10-22 (×7): 45 mL
  Filled 2020-10-19 (×7): qty 45

## 2020-10-19 MED ORDER — CHLORHEXIDINE GLUCONATE 0.12 % MT SOLN
15.0000 mL | Freq: Two times a day (BID) | OROMUCOSAL | Status: DC
  Administered 2020-10-19 – 2020-10-28 (×15): 15 mL via OROMUCOSAL
  Filled 2020-10-19 (×16): qty 15

## 2020-10-19 MED ORDER — SODIUM CHLORIDE 0.9 % IV SOLN: INTRAVENOUS | Status: DC

## 2020-10-19 NOTE — H&P (Addendum)
Neurology H&P  CC: large right hemispheric stroke  History is obtained from: chart, daughter and from excellent nursing staff caring for him now.  HPI: Gary [name] is a 69 y.o. male HTN, HLD, ANXIETY/DEPRESSION, CAD, A.FIB (no AC) and lives alone who was found down, unknown last normal and presented to OSH 10/18/2020 with AMS/left sided weakness. NIHSS: 14. NCT head showed large right MCA hypodensity and was outside any intervention window any intervention. Patient was transferred for higher level of care. Daughter last spoke to patient 10/14/2020.   LKW: above tpa given?: No, above IR Thrombectomy? No, above {Modified Rankin Scale: not clear NIHSS:  LOC Responsiveness 0 LOC Questions 2 LOC Commands 0 Horizontal eye movement 0 Visual field 2 Facial palsy 0 Motor arm - Right arm 0 Motor arm - Left arm 2 Motor leg - Right leg 0 Motor leg - Left leg 2 Limb ataxia 0 Sensory test 0 Language 3 Speech 0 Extinction and inattention 0  Result  NIH Stroke Scale/Score (NIHSS) 11   ROS:  Unable to assess due to large hemispheric stroke and encephalopathy.  Past Medical History:  Diagnosis Date  . Anxiety   . Arthritis    "right shoulder" (01/17/2014)  . Broken neck (HCC) 2000  . CAD (coronary artery disease)    a. NSTEMI 09/2013: - s/p balloon angioplasty of D1, c/b dissection but flow restored with prolonged balloon inflations;  b. 12/2016 Cath: LM 40ost, LAD 40p, LCX nl, RCA nl.  . Hyperlipidemia    a. intolerant to statins. b. Started on Zetia 09/2013.  Marland Kitchen Hypertension   . Ischemic cardiomyopathy    a. 09/2013: EF 30%, d/c with Lifevest. b.  s/p STJ dual chamber ICD implant 01/2014 by Dr Johney Frame  . Mitral regurgitation    a. Mild by echo 09/2013.  . Paroxysmal atrial fibrillation (HCC) 10/2016   chads2vasc score is 3.  . Protein calorie malnutrition (HCC)   . RBBB    Echocardiogram 10/2019: EF 20-25, Gr 1 DD, trivial MR, trivial TR, normal RVSF  . VT (ventricular tachycardia)  (HCC)    a. On admission with NSTEMI 09/2013.   Family History  Problem Relation Age of Onset  . Hypertension Mother    Social History:  reports that he quit smoking about 7 years ago. His smoking use included cigarettes. He has a 78.00 pack-year smoking history. He has quit using smokeless tobacco. He reports that he does not drink alcohol and does not use drugs.   Prior to Admission medications   Medication Sig Start Date End Date Taking? Authorizing Provider  acetaminophen (TYLENOL) 500 MG tablet Take 500 mg by mouth every 6 (six) hours as needed for moderate pain.   Yes [provider]  ALPRAZolam Prudy Feeler) 0.5 MG tablet Take 0.5 mg by mouth at bedtime as needed. 06/20/16  Yes [provider]  amLODipine (NORVASC) 5 MG tablet Take 5 mg by mouth daily. 10/14/20  Yes [provider]  aspirin EC 81 MG EC tablet Take 1 tablet (81 mg total) by mouth daily. 10/08/13  Yes Dunn, Dayna N, PA-C  carvedilol (COREG) 25 MG tablet TAKE (1) TABLET TWICE A DAY WITH MEALS (BREAKFAST AND SUPPER) Patient taking differently: Take 25 mg by mouth 2 (two) times daily with a meal. 10/02/19  Yes Weaver, Scott T, PA-C  escitalopram (LEXAPRO) 5 MG tablet Take 5 mg by mouth daily. 09/16/19  Yes [provider]  isosorbide mononitrate (IMDUR) 30 MG 24 hr tablet Take 1 tablet (  30 mg total) by mouth daily. 02/01/14  Yes Quita Skye, MD  losartan (COZAAR) 100 MG tablet Take 100 mg by mouth daily.   Yes [provider]  ONE TOUCH ULTRA TEST test strip  10/10/18  Yes [provider]  Dola Argyle LANCETS 33G MISC  10/10/18  Yes [provider]  senna (SENOKOT) 8.6 MG TABS tablet Take 1 tablet by mouth every other day.   Yes [provider]  Vitamin D, Ergocalciferol, (DRISDOL) 1.25 MG (50000 UNIT) CAPS capsule Take 50,000 Units by mouth once a week. 09/30/20  Yes [provider]  nitroGLYCERIN (NITROSTAT) 0.4 MG SL tablet Place 1 tablet (0.4 mg  total) under the tongue every 5 (five) minutes as needed for chest pain (up to 3 doses). 10/08/13   Laurann Montana, PA-C    Exam: Current vital signs: BP 139/66   Pulse 60   Temp 97.7 F (36.5 C)   Resp 19   Ht 5\' 6"  (1.676 m)   Wt 52.8 kg   SpO2 98%   BMI 18.79 kg/m   Physical Exam  Constitutional: Appears well-developed and well-nourished.  Psych: Unable to assess due to large hemispheric stroke and encephalopathy. Eyes: No scleral injection HENT: No OP obstrucion Head: Normocephalic.  Cardiovascular: Normal rate and regular rhythm.  Respiratory: Effort normal and breath sounds normal to anterior ascultation GI: Soft.  No distension. There is no tenderness.  Skin: WDI  Neuro: Exam confounded as patient is illiterate. Mental Status: Unable to assess due to large hemispheric stroke and encephalopathy. Patient is nonverbal with likely expressive aphasia. Cranial Nerves: II: Visual Fields left hemianopsia. Pupils are equal, round, and sluggishly reactive to light. III,IV, VI: EOMI without ptosis or diploplia.  V: Facial sensation is symmetric to temperature VII: Facial movement is symmetric.  VIII: hearing is intact to voice X: Uvula elevates symmetrically XI: Shoulder shrug is symmetric. XII: tongue is midline without atrophy or fasciculations.  Motor: Tone is normal. Bulk is decreased. Left upper and lower extremities 2/5 right 5/5  Sensory: Sensation is mildly decreased on left to pain temperature in the arms and legs. Deep Tendon Reflexes: 2+ and symmetric in the biceps and patellae. Plantars:  Babinski left. Cerebellar: Unable to assess due to large hemispheric stroke and encephalopathy.  I have reviewed labs in epic and the pertinent results are: Results for Gary, [name]" (MRN Tula Nakayama) as of 10/19/2020 06:47  Ref. Range 10/19/2020 05:01  Total CHOL/HDL Ratio Latest Units: RATIO 9.2  Cholesterol Latest Ref Range: 0 - 200 mg/dL 10/21/2020 (H)  HDL Cholesterol  Latest Ref Range: >40 mg/dL 27 (L)  LDL (calc) Latest Ref Range: 0 - 99 mg/dL 381 (H)  Triglycerides Latest Ref Range: <150 mg/dL 829 (H)  VLDL Latest Ref Range: 0 - 40 mg/dL 31    I have reviewed the images obtained: NCT head showed large, hemispheric hypodensity of almost the entire right MCA territory including right frontal and parietal lobes. There is minimal mass effect on the right lateral ventricle without significant midline shift. There is otherwise periventricular and deep white matter hypodensity throughout. No evidence of hemorrhage.  Assessment: Gary [name] is a 69 y.o. male PMHx HTN, HLD, ANXIETY/DEPRESSION, CAD, A.FIB (no AC) with large right MCA territory stroke. Though exam confounded as patient is illiterate now mute, he is awake does have minimal function on left and follows commands consistently.   Plan: - Repeat CT head noncontrast 24 hours as pacemaker not MRI compatible. -  Recommend vascular imaging with CTA head and neck. - Continue HTN saline as planned goal 140-150 - Recommend TTE. - Recommend labs: HbA1c - Recommend Statin if LDL > 70 - Hold antiplatelets for now - SBP goal < 180 as he is likely exiting the critical window. - Telemetry monitoring for arrhythmia. - Recommend bedside Swallow screen. - Recommend Stroke education. - Recommend PT/OT/SLP consult.  This patient is critically ill and at significant risk of neurological worsening, death and care requires constant monitoring of vital signs, hemodynamics,respiratory and cardiac monitoring, neurological assessment, discussion with family, other specialists and medical decision making of high complexity. I spent 73 minutes of neurocritical care time  in the care of  this patient. This was time spent independent of any time provided by nurse practitioner or PA.  Electronically signed by: Dr. Marisue Humble Pager: [phone] 10/19/2020, 5:35 AM
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
## 2020-10-19 NOTE — Evaluation (Signed)
Occupational Therapy Evaluation Patient Details Name: Gary [name] MRN: [id number] DOB: April 08, 1951 Today's Date: 10/19/2020    History of Present Illness The pt is a 69 yo male presenting after being found down at home. Upon work up, pt found to have "large hypodensity of right frontal and parietal lobes involving almost the entire right MCA territory." PMH includes: CAD with NSTEMI 2014 s/p balloon stent placement, HLD, HTN, cardiomyopathy, MR, pacemaker, and afib.   Clinical Impression   PTA, pt was living alone and was independent with ADLs and light IADLs; family assisted with further IADLs. Family very supportive and lives nearby. Pt currently requiring Mod A for UB ADLs, Max A for LB ADLs, and Max A +2 for functional transfers. Pt presenting with poor strength, functional use of LUE, inattention to left, vision deficits, cognitive deficits, and decreased balance. Pt would benefit from further acute OT to facilitate safe dc. Recommend dc to CIR for intensive OT to optimize safety, independence with ADLs, and return to PLOF as well as decrease caregiver burden.    Follow Up Recommendations  CIR    Equipment Recommendations  3 in 1 bedside commode;Wheelchair (measurements OT);Wheelchair cushion (measurements OT)    Recommendations for Other Services PT consult;Speech consult;Rehab consult     Precautions / Restrictions Precautions Precautions: Fall Precaution Comments: L hemiplegia and neglect      Mobility Bed Mobility Overal bed mobility: Needs Assistance Bed Mobility: Supine to Sit     Supine to sit: Mod assist;+2 for physical assistance     General bed mobility comments: modA to trunk, minA to LLE to move to EOB. pt able to pull to sit, assist to maintain stability and complete movement. modA to scoot to EOB    Transfers Overall transfer level: Needs assistance Equipment used: 2 person hand held assist Transfers: Sit to/from [employer] Sit to  Stand: Max assist;+2 physical assistance;+2 safety/equipment   Squat pivot transfers: Max assist;+2 safety/equipment     General transfer comment: Pt able to complete x2 sit-stand with mod/maxA of 2 with repeated cues to extend at hip and knee and maintain upright posture. pt unable to make corrections and maintain upright without significant assist. Attempted lateral step/pivot to recliner, but due to pt fatigur, Max A of 1 to pivot transfer    Balance Overall balance assessment: Needs assistance Sitting-balance support: Single extremity supported;Feet supported Sitting balance-Leahy Scale: Poor Sitting balance - Comments: modA with UE support Postural control: Posterior lean;Left lateral lean Standing balance support: Bilateral upper extremity supported;During functional activity Standing balance-Leahy Scale: Zero Standing balance comment: maxA of 2 to maintain                           ADL either performed or assessed with clinical judgement   ADL Overall ADL's : Needs assistance/impaired Eating/Feeding: NPO   Grooming: Moderate assistance;Wash/dry face;Minimal assistance;Sitting Grooming Details (indicate cue type and reason): Min A to wash face. Mod A for bilateral coorindation Upper Body Bathing: Moderate assistance;Sitting   Lower Body Bathing: Maximal assistance;Sit to/from stand   Upper Body Dressing : Moderate assistance;Sitting   Lower Body Dressing: Maximal assistance;Sit to/from stand   Toilet Transfer: Maximal assistance;Anterior/posterior;Squat-pivot (simulated to recliner)           Functional mobility during ADLs: Maximal assistance;+2 for physical assistance;+2 for safety/equipment (sit<>stand and squat pivot) General ADL Comments: Pt presenting with poor strength, functional use of LUE, inattention to left, vision deficits, cognitive deficits, and  decreased balance.     Vision Baseline Vision/History: Wears glasses Patient Visual Report:  Other (comment) (visual deficits and inattention) Vision Assessment?: Yes Eye Alignment: Impaired (comment) (dysconjugate gaze) Ocular Range of Motion: Restricted on the left (cannot bring eyes past midline to left) Alignment/Gaze Preference: Gaze right;Other (comment) (able to track towards midline but not past midline) Tracking/Visual Pursuits: Decreased smoothness of eye movement to LEFT superior field;Decreased smoothness of eye movement to LEFT inferior field Visual Fields: Left homonymous hemianopsia;Left visual field deficit     Perception     Praxis      Pertinent Vitals/Pain Pain Assessment: No/denies pain     Hand Dominance Right   Extremity/Trunk Assessment Upper Extremity Assessment Upper Extremity Assessment: LUE deficits/detail LUE Deficits / Details: Very poor grasp strength and poor gross motor. Poor attention to LUE and not incorporating into functional tasks. LUE Coordination: decreased fine motor;decreased gross motor   Lower Extremity Assessment Lower Extremity Assessment: Defer to PT evaluation LLE Deficits / Details: 3/5 at hip and knee, pt able to initiate heel slide with minimal ROM but consistent activation, no isolated movement at toes LLE Sensation:  (pt states sensation intact bilaterally) LLE Coordination: decreased fine motor;decreased gross motor   Cervical / Trunk Assessment Cervical / Trunk Assessment: Other exceptions Cervical / Trunk Exceptions: strong L lateral lean   Communication Communication Communication: Expressive difficulties (soft spoken, mumbled)   Cognition Arousal/Alertness: Awake/alert Behavior During Therapy: Flat affect Overall Cognitive Status: Impaired/Different from baseline Area of Impairment: Attention;Memory;Safety/judgement;Awareness;Problem solving;Following commands                   Current Attention Level: Focused Memory: Decreased short-term memory Following Commands: Follows one step commands with  increased time;Follows one step commands inconsistently Safety/Judgement: Decreased awareness of safety;Decreased awareness of deficits Awareness: Intellectual Problem Solving: Decreased initiation;Difficulty sequencing;Requires tactile cues;Requires verbal cues General Comments: Pt able to answer questions about prior level of function, but requires repeated cues to identify deficits, improve posture, positioning, and midline. Decreased insight to safety   General Comments  Daughter present throughout. SpO2 90s on RA; upon transitioning to recliner, SpO2 dropping to 86% on RA and requiring 3L to return to 90s.    Exercises     Shoulder Instructions      Home Living Family/patient expects to be discharged to:: Private residence Living Arrangements: Alone Available Help at Discharge: Friend(s);Available PRN/intermittently;Family (family lives nearby, no 24/7) Type of Home: House Home Access: Stairs to enter [employer] of Steps: 4-5 front porch, 4 + 4 at back Entrance Stairs-Rails: Can reach both Home Layout: One level     Bathroom Shower/Tub: Chief Strategy Officer: Standard (with raiser)     Home Equipment: Bedside commode;Walker - 2 wheels;Wheelchair - power;Wheelchair - manual;Grab bars - tub/shower;Shower seat          Prior Functioning/Environment Level of Independence: Needs assistance  Gait / Transfers Assistance Needed: recent increase in falls, 3 in last year. no AD use ADL's / Homemaking Assistance Needed: pt rides with family to run errands, cooks simple meals   Comments: reports decline in last year with ~3 falls, not using AD. Daughter reports able to walk around grocery store 3 days PTA.        OT Problem List: Decreased strength;Decreased activity tolerance;Decreased range of motion;Decreased knowledge of use of DME or AE;Decreased safety awareness;Decreased knowledge of precautions;Decreased cognition;Pain;Impaired UE functional  use;Impaired vision/perception;Impaired balance (sitting and/or standing);Decreased coordination      OT Treatment/Interventions: Self-care/ADL  training;Therapeutic exercise;DME and/or AE instruction;Energy conservation;Therapeutic activities;Patient/family education    OT Goals(Current goals can be found in the care plan section) Acute Rehab OT Goals Patient Stated Goal: return home OT Goal Formulation: With patient Time For Goal Achievement: 11/02/20 Potential to Achieve Goals: Good  OT Frequency: Min 2X/week   Barriers to D/C:            Co-evaluation PT/OT/SLP Co-Evaluation/Treatment: Yes Reason for Co-Treatment: Complexity of the patient's impairments (multi-system involvement);For patient/therapist safety;To address functional/ADL transfers   OT goals addressed during session: ADL's and self-care      AM-PAC OT "6 Clicks" Daily Activity     Outcome Measure Help from another person eating meals?: Total Help from another person taking care of personal grooming?: A Lot Help from another person toileting, which includes using toliet, bedpan, or urinal?: A Lot Help from another person bathing (including washing, rinsing, drying)?: A Lot Help from another person to put on and taking off regular upper body clothing?: A Lot Help from another person to put on and taking off regular lower body clothing?: Total 6 Click Score: 10   End of Session Equipment Utilized During Treatment: Oxygen;Gait belt (3L) Nurse Communication: Mobility status  Activity Tolerance: Patient tolerated treatment well Patient left: in chair;with call bell/phone within reach;with chair alarm set;with family/visitor present;with nursing/sitter in room (MD)  OT Visit Diagnosis: Unsteadiness on feet (R26.81);Muscle weakness (generalized) (M62.81);Other abnormalities of gait and mobility (R26.89);Pain Pain - part of body:  (Generalized)                Time: 8828-0034 OT Time Calculation (min): 29  min Charges:  OT General Charges $OT Visit: 1 Visit  Marin Milley MSOT, OTR/L Acute Rehab Pager: [phone] Office: [phone]  Theodoro Grist Masiah Lewing 10/19/2020, 5:47 PM

## 2020-10-19 NOTE — Progress Notes (Signed)
Bedside Swallow Assessment  10/19/20 1050  SLP Visit Information  SLP Received On 10/19/20  General Information  Date of Onset 10/18/20  HPI 69 yo admit for unresponsiveness. Found by family and imaging revealed to have large right frontal and parietal density involving most of the right MCA territory. PMH: HTN, protein calorie mannutrition, mitral regurgitation, neck fx 2000.  Type of Study Bedside Swallow Evaluation  Previous Swallow Assessment  (none)  Diet Prior to this Study NPO  Temperature Spikes Noted No  Respiratory Status Nasal cannula  History of Recent Intubation No  Behavior/Cognition Requires cueing;Lethargic/Drowsy;Cooperative;Confused;Distractible  Oral Cavity Assessment Dry  Oral Care Completed by SLP Yes  Oral Cavity - Dentition Poor condition;Missing dentition (one  tiny nub of a tooth)  Vision Impaired for self-feeding  Self-Feeding Abilities Needs assist  Patient Positioning Upright in bed  Baseline Vocal Quality Low vocal intensity  Volitional Cough Weak  Volitional Swallow Able to elicit  Pain Assessment  Pain Assessment Faces  Faces Pain Scale 2  Oral Assessment (Complete on admission/transfer/change in patient condition)  Does patient have any of the following "high(er) risk" factors? Nutritional status - fluids only or NPO for >24 hours  Patient is HIGH RISK: Non-ventilated Order set for Adult Oral Care Protocol initiated - "High Risk Patients - Non-Ventilated" option selected  (see row information)  Oral Motor/Sensory Function  Overall Oral Motor/Sensory Function Moderate impairment  Facial ROM Reduced left;Suspected CN VII (facial) dysfunction  Facial Symmetry Abnormal symmetry left;Suspected CN VII (facial) dysfunction  Facial Strength Reduced left;Suspected CN VII (facial) dysfunction  Lingual ROM Reduced left;Suspected CN XII (hypoglossal) dysfunction  Lingual Symmetry WFL  Ice Chips  Ice chips Impaired  Presentation Spoon  Oral Phase  Impairments Reduced lingual movement/coordination;Reduced labial seal  Oral Phase Functional Implications Prolonged oral transit  Pharyngeal Phase Impairments  (no s/s)  Thin Liquid  Thin Liquid Impaired  Presentation Cup;Spoon  Oral Phase Impairments Reduced labial seal  Oral Phase Functional Implications Left anterior spillage  Pharyngeal  Phase Impairments Other (comments);Multiple swallows (discoordinated respirations)  Nectar Thick Liquid  Nectar Thick Liquid NT  Honey Thick Liquid  Honey Thick Liquid NT  Puree  Puree Impaired  Oral Phase Impairments Reduced labial seal  Oral Phase Functional Implications Other (comment) (labial residue)  Pharyngeal Phase Impairments  (functional)  Solid  Solid NT  SLP - End of Session  Patient left in bed;with family/visitor present  Nurse Communication Diet recommendation  SLP Assessment  Clinical Impression Statement (ACUTE ONLY) Pt awake but falling asleep easily- admitted to [facility] at 0200 this morning. 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 He did not cough with sips water but appeared overwhelmed with large sip, multiple swallows and dyscoordinated respiration and swallow. He wil need instrumental assessment to recommend safest and most efficient po's given large stroke and deconditioned state. Continue NPO and oral care. Daughter [name] in agreement.  SLP Visit Diagnosis Dysphagia, unspecified (R13.10)  Impact on safety and function Moderate aspiration risk  Other Related Risk Factors Lethargy;Deconditioning  Swallow Evaluation Recommendations  SLP Diet Recommendations NPO  Medication Administration Via alternative means  Postural Changes Seated upright at 90 degrees  Treatment Plan  Oral Care Recommendations Oral care QID  Individuals Consulted  Consulted and Agree with Results and Recommendations Family member/caregiver;Patient unable/family or  caregiver not available;MD;RN  Family Member Consulted dtr [name]  SLP Time Calculation  SLP Start Time (ACUTE ONLY) 1052  SLP Stop Time (ACUTE ONLY)  1113  SLP Time Calculation (min) (ACUTE ONLY) 21 min  SLP Evaluations  $ SLP Speech Visit 1 Visit  SLP Evaluations  $BSS Swallow 1 Procedure    Breck Coons [place] M.Ed Nurse, children's [phone] Office [phone]

## 2020-10-19 NOTE — Progress Notes (Signed)
Per patient's daughter, patient is unable to read.

## 2020-10-19 NOTE — Consult Note (Signed)
WOC Nurse Consult Note: Reason for Consult: pressure injuries Patient found down at home Wound type: 1. Abrasion: left shoulder 2. Deep Tissue Pressure Injury left trochanter 3-4. Bruising left lateral knee 5. Deep tissue Pressure Injury right medial knee 6. Abrasion left lateral thigh  Pressure Injury POA: Yes Measurement: 1. Left shoulder; 1cm x 1cm x 0cm; dry abrasion 2. Left trochanter; 0.5cm x 1.2cm x 0.2cm ; dark purple area with superficial skin loss, but not large or circular like typical pressure injury 3-4 grey areas of bruising left lateral knee; does not present like DTPI 5. 2.0cm x 3.0cm x 0.1cm; 100% dark non blanchable skin with superficial skin loss 6. 10cm x 12cm x 0cm; rough reddened area not open Wound bed: see above  Drainage (amount, consistency, odor) scant, no odor Periwound: intact  Dressing procedure/placement/frequency:silicone foam to the affected areas, monitor for acute changes Shanen Norris [facility], CNS, CWON-AP [phone]

## 2020-10-19 NOTE — Evaluation (Signed)
Physical Therapy Evaluation Patient Details Name: Gary [name] MRN: [id number] DOB: 1951-07-20 Today's Date: 10/19/2020   History of Present Illness  The pt is a 69 yo male presenting after being found down at home. Upon work up, pt found to have "large hypodensity of right frontal and parietal lobes involving almost the entire right MCA territory." PMH includes: CAD with NSTEMI 2014 s/p balloon stent placement, HLD, HTN, cardiomyopathy, MR, pacemaker, and afib.    Clinical Impression  Pt in bed upon arrival of PT, agreeable to evaluation at this time. Prior to admission the pt was independent with mobility without use of AD, an independent with ADLs. He received assist from family for errands as pt no longer drives. The pt now presents with limitations in functional mobility, strength, power, stability, coordination, and endurance due to above dx, and will continue to benefit from skilled PT to address these deficits. The pt was able to initiate some movement in LLE to move to sitting EOB, but does require modA of 2 to complete transition and maintain sitting balance. The pt was then able to complete multiple sit-stand transfers with maxA of 2 with repeated cues at hips, blocking of L knee, and repeated cues and assist to facilitate wt shift and maintain midline. The pt was able to transfer to chair with totalA pivot transfer due to fatigue with attempt to continue stepping in stance. The pt is highly motivated to improve mobility, and will benefit from intensive therapies to return to prior level of independence and function.      Follow Up Recommendations CIR    Equipment Recommendations  Other (comment) (defer to post acute)    Recommendations for Other Services Rehab consult     Precautions / Restrictions Precautions Precautions: Fall Precaution Comments: L hemiplegia and neglect Restrictions Weight Bearing Restrictions: No      Mobility  Bed Mobility Overal bed mobility: Needs  Assistance Bed Mobility: Supine to Sit     Supine to sit: Mod assist;+2 for physical assistance     General bed mobility comments: modA to trunk, minA to LLE to move to EOB. pt able to pull to sit, assist to maintain stability and complete movement. modA to scoot to EOB    Transfers Overall transfer level: Needs assistance Equipment used: 2 person hand held assist Transfers: Sit to/from [employer] Sit to Stand: Max assist;+2 physical assistance;+2 safety/equipment   Squat pivot transfers: Total assist     General transfer comment: Pt able to complete x2 sit-stand with mod/maxA of 2 with repeated cues to extend at hip and knee and maintain upright posture. pt unable to make corrections and maintain upright without significant assist. Attempted lateral step/pivot to recliner, but due to pt fatigur, totalA of 1 was easiest pivot transfer  Ambulation/Gait Ambulation/Gait assistance: Max assist;+2 physical assistance Gait Distance (Feet): 2 Feet Assistive device: 2 person hand held assist Gait Pattern/deviations: Step-to pattern   Gait velocity interpretation: <1.31 ft/sec, indicative of household ambulator General Gait Details: heavy assist on L side, cues and assist to maintain midline, upright posture, wt shift, and to facilitate movement of LLE   Modified Rankin (Stroke Patients Only) Modified Rankin (Stroke Patients Only) Pre-Morbid Rankin Score: Slight disability Modified Rankin: Severe disability     Balance Overall balance assessment: Needs assistance Sitting-balance support: Single extremity supported;Feet supported Sitting balance-Leahy Scale: Poor Sitting balance - Comments: modA with UE support Postural control: Posterior lean;Left lateral lean Standing balance support: Bilateral upper extremity supported;During functional activity  Standing balance-Leahy Scale: Zero Standing balance comment: maxA of 2 to maintain                              Pertinent Vitals/Pain Pain Assessment: No/denies pain Faces Pain Scale: Hurts a little bit    Home Living Family/patient expects to be discharged to:: Private residence Living Arrangements: Alone Available Help at Discharge: Friend(s);Available PRN/intermittently;Family (family lives nearby, no 24/7) Type of Home: House Home Access: Stairs to enter Entrance Stairs-Rails: Can reach both Entrance Stairs-Number of Steps: 4-5 front porch, 4 + 4 at back Home Layout: One level Home Equipment: Bedside commode;Walker - 2 wheels;Wheelchair - power;Wheelchair - manual;Grab bars - tub/shower;Shower seat      Prior Function Level of Independence: Needs assistance   Gait / Transfers Assistance Needed: recent increase in falls, 3 in last year. no AD use  ADL's / Homemaking Assistance Needed: pt rides with family to run errands, cooks simple meals  Comments: reports decline in last year with ~3 falls, not using AD. Daughter reports able to walk around grocery store 3 days PTA.     Hand Dominance   Dominant Hand: Right    Extremity/Trunk Assessment   Upper Extremity Assessment Upper Extremity Assessment: Defer to OT evaluation    Lower Extremity Assessment Lower Extremity Assessment: LLE deficits/detail;Generalized weakness LLE Deficits / Details: 3/5 at hip and knee, pt able to initiate heel slide with minimal ROM but consistent activation, no isolated movement at toes LLE Sensation:  (pt states sensation intact bilaterally) LLE Coordination: decreased fine motor;decreased gross motor    Cervical / Trunk Assessment Cervical / Trunk Assessment: Other exceptions Cervical / Trunk Exceptions: strong L lateral lean  Communication   Communication: Expressive difficulties (soft spoken, mumbled)  Cognition Arousal/Alertness: Awake/alert Behavior During Therapy: Flat affect Overall Cognitive Status: Impaired/Different from baseline Area of Impairment:  Attention;Memory;Safety/judgement;Awareness;Problem solving                   Current Attention Level: Focused Memory: Decreased short-term memory   Safety/Judgement: Decreased awareness of safety;Decreased awareness of deficits   Problem Solving: Decreased initiation;Difficulty sequencing;Requires tactile cues;Requires verbal cues General Comments: Pt able to answer questions about prior level of function, but requires repeated cues to identify deficits, improve posture, positioning, and midline. Decreased insight to safety      General Comments General comments (skin integrity, edema, etc.): VSS on RA with mobility, with fatigue following session the pt desat to SpO2 of 85%, left on 3L O2 and RN notified, daughter present and supportive    Exercises     Assessment/Plan    PT Assessment Patient needs continued PT services  PT Problem List Decreased strength;Decreased activity tolerance;Decreased balance;Decreased mobility;Decreased coordination;Decreased cognition;Decreased safety awareness       PT Treatment Interventions DME instruction;Gait training;Stair training;Functional mobility training;Therapeutic activities;Therapeutic exercise;Balance training;Neuromuscular re-education;Patient/family education    PT Goals (Current goals can be found in the Care Plan section)  Acute Rehab PT Goals Patient Stated Goal: return home PT Goal Formulation: With patient Time For Goal Achievement: 11/02/20 Potential to Achieve Goals: Good    Frequency Min 4X/week   Barriers to discharge Decreased caregiver support      Co-evaluation PT/OT/SLP Co-Evaluation/Treatment: Yes Reason for Co-Treatment: Complexity of the patient's impairments (multi-system involvement);Necessary to address cognition/behavior during functional activity;To address functional/ADL transfers;For patient/therapist safety PT goals addressed during session: Mobility/safety with  mobility;Balance;Strengthening/ROM         AM-PAC PT "  6 Clicks" Mobility  Outcome Measure Help needed turning from your back to your side while in a flat bed without using bedrails?: A Lot Help needed moving from lying on your back to sitting on the side of a flat bed without using bedrails?: A Lot Help needed moving to and from a bed to a chair (including a wheelchair)?: Total Help needed standing up from a chair using your arms (e.g., wheelchair or bedside chair)?: A Lot Help needed to walk in hospital room?: Total Help needed climbing 3-5 steps with a railing? : Total 6 Click Score: 9    End of Session Equipment Utilized During Treatment: Gait belt;Oxygen Activity Tolerance: Patient tolerated treatment well;Patient limited by fatigue Patient left: in chair;with call bell/phone within reach;with chair alarm set;with nursing/sitter in room;with family/visitor present Nurse Communication: Mobility status PT Visit Diagnosis: Unsteadiness on feet (R26.81);Other abnormalities of gait and mobility (R26.89);Hemiplegia and hemiparesis Hemiplegia - Right/Left: Left Hemiplegia - dominant/non-dominant: Non-dominant Hemiplegia - caused by: Cerebral infarction    Time: 1115-1145 PT Time Calculation (min) (ACUTE ONLY): 30 min   Charges:   PT Evaluation $PT Eval Moderate Complexity: 1 Mod          Rolm Baptise, PT, DPT   Acute Rehabilitation Department Pager #: [phone]  Gaetana Michaelis 10/19/2020, 1:56 PM

## 2020-10-19 NOTE — Progress Notes (Signed)
Inpatient Rehab Admissions Coordinator Note:   Per therapy recommendations, pt was screened for CIR candidacy by Molli Gethers, MS CCC-SLP. At this time, Pt. Appears to have functional decline and is a good candidate for CIR. Will pursue order for rehab consult per protocol.  Please contact me with questions.   Lynzi Meulemans, MS, CCC-SLP Rehab Admissions Coordinator  [phone] (celll) [phone] (office)   

## 2020-10-19 NOTE — Progress Notes (Addendum)
Sodium has not resulted all day. I have seen pt get blood drawn several times throughout day. Lab called. No Answer. Phlebotomy called and said "I just got here, but the last sodium was "rejected" in lab, and I will come get another as soon as I can. Will continue to try to get these labs drawn/resulted.  Above discussed with care team Dr Roda Shutters and Dr Nedra Hai.

## 2020-10-19 NOTE — ED Notes (Signed)
Report given to [facility]. 

## 2020-10-19 NOTE — Progress Notes (Signed)
[facility]Link Physician-Brief Progress Note Patient Name: Gary [name] DOB: 11/22/1950 MRN: [id number]   Date of Service  10/19/2020  HPI/Events of Note  Hypoxia -- Sat decreased to 86-87% on 15 L/min [place] O2. Aspiration? Atelectasis?  eICU Interventions  Plan: 1. Trail of BiPAP. 2. Portable CXR STAT. 3. Will ask ground team to evaluate the patient at beside.      Intervention Category Major Interventions: Hypoxemia - evaluation and management  Rosalio Catterton [place] 10/19/2020, 10:26 PM
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
## 2020-10-19 NOTE — Progress Notes (Signed)
Pt oxygen saturation low 80s on 4L . O2 increased to 6L and patient remains in mid-80s. RT and [facility] made aware. Patient placed on HFNC. O2 sat is currently 85 on 12 HFNC.Sat goal 88-92% per Dr. Arsenio Loader. CXR ordered. Patient is DNR/DNI. RN will continue to monitor.

## 2020-10-19 NOTE — Progress Notes (Signed)
Initial Nutrition Assessment  DOCUMENTATION CODES:   Severe malnutrition in context of chronic illness  INTERVENTION:   Initiate tube feeding via Cortrak tube: Osmolite 1.5 at 25 ml/h and increase by 10 ml every 8 hours to goal rate of 45 ml/h (1080 ml per day) Prosource TF 45 ml BID  Provides 1700 kcal, 89 gm protein, 820 ml free water daily  MVI with minerals daily  Monitor magnesium and phosphorus every 12 hours x 4 occurances, MD to replete as needed, as pt is at risk for refeeding syndrome given severe malnutrition.    NUTRITION DIAGNOSIS:   Severe Malnutrition related to chronic illness as evidenced by severe muscle depletion,severe fat depletion.  GOAL:   Patient will meet greater than or equal to 90% of their needs  MONITOR:   TF tolerance,Diet advancement  REASON FOR ASSESSMENT:   Consult Enteral/tube feeding initiation and management  ASSESSMENT:   Pt with PMH of CAD with NSTEMI 2014 s/p stent placement, HLD, HTN, cardiomyopathy, and PAF admitted after being found down at home by family with large R MCA stroke.    Pt discussed during ICU rounds and with RN.  Pt with dense hemiplegia on the left. Pt with multiple pressure injuries on admission due to prolonged immobilization.   Spoke with daughter who is at bedside. Per daughter pt has weighed around 123 lb but had noticed weight loss. Pt 116 lb on admission; 6% weight loss. However, time frame for weight loss unclear.  Pt lives alone, daughter does shopping. Per daughter pt eats frozen meals such as [place] steak and canned foods like beanie weenies. Pt only has one tooth and must eat soft foods, he has refused dentures in the past.  Pt does drink 2 boost drinks per day when he can afford them.   12/13 cortrak placed; tip in duodenal bulb   Medications reviewed and include:  Kphos x 1  Labs reviewed: Na 149, PO4: 2.2    NUTRITION - FOCUSED PHYSICAL EXAM:  Flowsheet Row Most Recent Value  Orbital  Region Severe depletion  Upper Arm Region Severe depletion  Thoracic and Lumbar Region Severe depletion  Buccal Region Severe depletion  Temple Region Severe depletion  Clavicle Bone Region Severe depletion  Clavicle and Acromion Bone Region Severe depletion  Scapular Bone Region Severe depletion  Dorsal Hand Mild depletion  Patellar Region Severe depletion  Anterior Thigh Region Severe depletion  Posterior Calf Region Severe depletion  Edema (RD Assessment) None  Hair Reviewed  Eyes Reviewed  Mouth Reviewed  Skin Reviewed  Nails Reviewed       Diet Order:   Diet Order            Diet NPO time specified  Diet effective now                 EDUCATION NEEDS:   No education needs have been identified at this time  Skin:  Skin Assessment: Skin Integrity Issues: Skin Integrity Issues:: DTI DTI: L hip and R knee  Last BM:  unknown  Height:   Ht Readings from Last 1 Encounters:  10/19/20 5\' 6"  (1.676 m)    Weight:   Wt Readings from Last 1 Encounters:  10/19/20 52.8 kg    Ideal Body Weight:  64.5 kg  BMI:  Body mass index is 18.79 kg/m.  Estimated Nutritional Needs:   Kcal:  1600-1800  Protein:  80-95 grams  Fluid:  >1.6 L/day  10/21/20., RD, LDN, CNSC See AMiON for  contact information

## 2020-10-19 NOTE — ED Notes (Addendum)
Pt transferred to [facility] ED, bib [facility] to rm 41. Pt sleeping on arrival. 3% IV solution infusing.

## 2020-10-19 NOTE — Progress Notes (Signed)
Pt has MRI UNSAFE pacemaker

## 2020-10-19 NOTE — Progress Notes (Addendum)
STROKE TEAM PROGRESS NOTE   INTERVAL HISTORY His RN and daughter are at the bedside.  Patient sitting in chair, lethargic and sleepy, but easily arousable, answering questions appropriately, orientated x3, however moderate to severe dysarthria.  Right gaze preference, left neglect, left hemianopia, left hemiplegia.  CT head and neck today showed stable large right MCA stroke with minimal midline shift.  DC 3% saline.  Vitals:   10/19/20 0600 10/19/20 0700 10/19/20 0800 10/19/20 0900  BP: (!) 121/94 (!) 144/74 137/79 (!) 155/71  Pulse: 65 70 72 84  Resp: 19 19 (!) 21 (!) 23  Temp:   97.8 F (36.6 C)   TempSrc:   Axillary   SpO2: 91% 90% 100% 98%  Weight:      Height:       CBC:  Recent Labs  Lab 10/18/20 1410 10/19/20 0501  WBC 20.4* 14.0*  NEUTROABS 17.2*  --   HGB 16.8 15.3  HCT 49.3 47.1  MCV 93.5 96.3  PLT 180 186   Basic Metabolic Panel:  Recent Labs  Lab 10/18/20 2346 10/19/20 0501  NA 144 149*  K 3.3* 3.7  CL 108 111  CO2 25 25  GLUCOSE 128* 122*  BUN 58* 52*  CREATININE 0.82 0.85  CALCIUM 8.8* 9.0   Lipid Panel:  Recent Labs  Lab 10/19/20 0501  CHOL 248*  TRIG 153*  HDL 27*  CHOLHDL 9.2  VLDL 31  LDLCALC 865*   HgbA1c:  Recent Labs  Lab 10/19/20 0459  HGBA1C 5.9*   Urine Drug Screen: No results for input(s): LABOPIA, COCAINSCRNUR, LABBENZ, AMPHETMU, THCU, LABBARB in the last 168 hours.  Alcohol Level  Recent Labs  Lab 10/18/20 1414  ETH <10    IMAGING past 24 hours CT ANGIO HEAD W OR WO CONTRAST  Result Date: 10/19/2020 CLINICAL DATA:  Follow-up stroke. EXAM: CT ANGIOGRAPHY HEAD AND NECK TECHNIQUE: Multidetector CT imaging of the head and neck was performed using the standard protocol during bolus administration of intravenous contrast. Multiplanar CT image reconstructions and MIPs were obtained to evaluate the vascular anatomy. Carotid stenosis measurements (when applicable) are obtained utilizing NASCET criteria, using the distal  internal carotid diameter as the denominator. CONTRAST:  68mL OMNIPAQUE IOHEXOL 350 MG/ML SOLN COMPARISON:  Head CT yesterday FINDINGS: CT HEAD FINDINGS Brain: No focal abnormality affects the brainstem or cerebellum. Left cerebral hemisphere shows mild chronic small-vessel change of the white matter. On the right, there is acute/subacute infarction within the right MCA inferior division with low-density and swelling. No hemorrhagic transformation. Mild mass effect no midline shift. No new area of involvement compared to yesterday's study. Vascular: There is atherosclerotic calcification of the major vessels at the base of the brain. Skull: Negative Sinuses: Clear/normal Orbits: None Review of the MIP images confirms the above findings CTA NECK FINDINGS Aortic arch: Aortic atherosclerotic calcification. No aneurysm or dissection. Branching pattern is normal. 30% stenosis of the proximal left subclavian artery origin. Right carotid system: Common carotid artery shows some scattered plaque but is widely patent to the bifurcation. Extensive soft and calcified plaque at the carotid bifurcation and ICA bulb. Diameter at the proximal bulb is 1.5 mm. Compared to a more distal cervical ICA diameter of 3 mm, this indicates only a 50% stenosis, but is probably functionally greater than that. Cervical ICA is patent distal to bulb. Left carotid system: Common carotid artery shows some scattered plaque but is widely patent to the bifurcation region. Calcified plaque at the bifurcation calcified and soft plaque  affecting the ICA bulb. Minimal diameter of the distal bulb is 3 mm, the same as more distal cervical ICA, therefore no stenosis. Vertebral arteries: 30% stenosis of the dominant right vertebral artery origin. 50% stenosis of the non dominant left vertebral artery origin. Beyond that, both vertebral arteries are widely patent through the cervical region to foramen magnum. Skeleton: Previous ACDF C4 through C6.  No acute  bone finding. Other neck: No mass or lymphadenopathy. Upper chest: Emphysema. Pulmonary scarring. At least partial collapse of the left lower lobe. Review of the MIP images confirms the above findings CTA HEAD FINDINGS Anterior circulation: Both internal carotid arteries are patent through the skull base and siphon regions. Ordinary siphon atherosclerotic calcification without additional stenosis. On the right, the anterior cerebral artery is patent. There is occlusion of the right MCA inferior division M2 branch. There is stenosis the right MCA superior division M2 branch, probably due to additional nonocclusive embolus. On the left, the anterior and middle cerebral vessels are patent. Posterior circulation: Both vertebral arteries are patent to the basilar. No basilar stenosis. Posterior circulation branch vessels are patent. Mild atherosclerotic irregularity within the more distal PCA branches. Venous sinuses: Patent and normal. Anatomic variants: None significant. Review of the MIP images confirms the above findings IMPRESSION: 1. Acute/subacute infarction within the right MCA inferior division territory with low-density and swelling. No hemorrhagic transformation. Mild mass effect but no midline shift. 2. Extensive soft and calcified plaque at the right carotid bifurcation and ICA bulb. 50% stenosis of the proximal bulb. Luminal diameter is only 1.5 mm, therefore the stenosis could be functionally greater than that measured using NASCET criteria. 3. 30% stenosis of the dominant right vertebral artery origin. 50% stenosis of the non dominant left vertebral artery origin. 4. Occlusion of the right MCA inferior division M2 branch. Stenosis the right MCA superior division M2 branch which could be due to additional nonocclusive embolus. 5. Emphysema. At least partial collapse of the left lower lobe. 6. Emphysema and aortic atherosclerosis. Aortic Atherosclerosis (ICD10-I70.0) and Emphysema (ICD10-J43.9).  Electronically Signed   By: Paulina Fusi M.D.   On: 10/19/2020 10:12   DG Shoulder Right  Result Date: 10/18/2020 CLINICAL DATA:  Fall EXAM: RIGHT SHOULDER - 2+ VIEW COMPARISON:  None. FINDINGS: Degenerative changes in the [facility] joint with joint space narrowing and spurring. Glenohumeral joint is maintained. No acute bony abnormality. Specifically, no fracture, subluxation, or dislocation. Soft tissues are intact. IMPRESSION: Degenerative changes in the right AC joint. No acute bony abnormality. Electronically Signed   By: Charlett Nose M.D.   On: 10/18/2020 19:41   DG Elbow Complete Left  Result Date: 10/18/2020 CLINICAL DATA:  Fall EXAM: LEFT ELBOW - COMPLETE 3+ VIEW COMPARISON:  None. FINDINGS: There is no evidence of fracture, dislocation, or joint effusion. There is no evidence of arthropathy or other focal bone abnormality. Soft tissues are unremarkable. IMPRESSION: Negative. Electronically Signed   By: Charlett Nose M.D.   On: 10/18/2020 19:40   CT Head Wo Contrast  Result Date: 10/18/2020 CLINICAL DATA:  Neuro deficit, stroke suspected, found on floor, unknown last normal, right facial droop EXAM: CT HEAD WITHOUT CONTRAST TECHNIQUE: Contiguous axial images were obtained from the base of the skull through the vertex without intravenous contrast. COMPARISON:  None. FINDINGS: Brain: There is a large, hemispheric hypodensity of the right frontal and parietal lobes involving almost the entire right MCA territory with associated sulcal effacement. There is minimal mass effect on the right lateral ventricle without significant  midline shift. There is otherwise periventricular and deep white matter hypodensity throughout. No evidence of hemorrhage. Vascular: No hyperdense vessel or unexpected calcification. Skull: Normal. Negative for fracture or focal lesion. Sinuses/Orbits: No acute finding. Other: None. IMPRESSION: 1. There is a large, hemispheric hypodensity of the right frontal and parietal lobes  involving almost the entire right MCA territory with associated sulcal effacement. There is minimal mass effect on the right lateral ventricle without significant midline shift. Findings are consistent with late acute to subacute right MCA territory infarction. No evidence of hemorrhage. 2. Underlying small-vessel white matter disease. Electronically Signed   By: Lauralyn Primes M.D.   On: 10/18/2020 15:43   CT ANGIO NECK W OR WO CONTRAST  Result Date: 10/19/2020 CLINICAL DATA:  Follow-up stroke. EXAM: CT ANGIOGRAPHY HEAD AND NECK TECHNIQUE: Multidetector CT imaging of the head and neck was performed using the standard protocol during bolus administration of intravenous contrast. Multiplanar CT image reconstructions and MIPs were obtained to evaluate the vascular anatomy. Carotid stenosis measurements (when applicable) are obtained utilizing NASCET criteria, using the distal internal carotid diameter as the denominator. CONTRAST:  62mL OMNIPAQUE IOHEXOL 350 MG/ML SOLN COMPARISON:  Head CT yesterday FINDINGS: CT HEAD FINDINGS Brain: No focal abnormality affects the brainstem or cerebellum. Left cerebral hemisphere shows mild chronic small-vessel change of the white matter. On the right, there is acute/subacute infarction within the right MCA inferior division with low-density and swelling. No hemorrhagic transformation. Mild mass effect no midline shift. No new area of involvement compared to yesterday's study. Vascular: There is atherosclerotic calcification of the major vessels at the base of the brain. Skull: Negative Sinuses: Clear/normal Orbits: None Review of the MIP images confirms the above findings CTA NECK FINDINGS Aortic arch: Aortic atherosclerotic calcification. No aneurysm or dissection. Branching pattern is normal. 30% stenosis of the proximal left subclavian artery origin. Right carotid system: Common carotid artery shows some scattered plaque but is widely patent to the bifurcation. Extensive soft  and calcified plaque at the carotid bifurcation and ICA bulb. Diameter at the proximal bulb is 1.5 mm. Compared to a more distal cervical ICA diameter of 3 mm, this indicates only a 50% stenosis, but is probably functionally greater than that. Cervical ICA is patent distal to bulb. Left carotid system: Common carotid artery shows some scattered plaque but is widely patent to the bifurcation region. Calcified plaque at the bifurcation calcified and soft plaque affecting the ICA bulb. Minimal diameter of the distal bulb is 3 mm, the same as more distal cervical ICA, therefore no stenosis. Vertebral arteries: 30% stenosis of the dominant right vertebral artery origin. 50% stenosis of the non dominant left vertebral artery origin. Beyond that, both vertebral arteries are widely patent through the cervical region to foramen magnum. Skeleton: Previous ACDF C4 through C6.  No acute bone finding. Other neck: No mass or lymphadenopathy. Upper chest: Emphysema. Pulmonary scarring. At least partial collapse of the left lower lobe. Review of the MIP images confirms the above findings CTA HEAD FINDINGS Anterior circulation: Both internal carotid arteries are patent through the skull base and siphon regions. Ordinary siphon atherosclerotic calcification without additional stenosis. On the right, the anterior cerebral artery is patent. There is occlusion of the right MCA inferior division M2 branch. There is stenosis the right MCA superior division M2 branch, probably due to additional nonocclusive embolus. On the left, the anterior and middle cerebral vessels are patent. Posterior circulation: Both vertebral arteries are patent to the basilar. No basilar stenosis. Posterior  circulation branch vessels are patent. Mild atherosclerotic irregularity within the more distal PCA branches. Venous sinuses: Patent and normal. Anatomic variants: None significant. Review of the MIP images confirms the above findings IMPRESSION: 1.  Acute/subacute infarction within the right MCA inferior division territory with low-density and swelling. No hemorrhagic transformation. Mild mass effect but no midline shift. 2. Extensive soft and calcified plaque at the right carotid bifurcation and ICA bulb. 50% stenosis of the proximal bulb. Luminal diameter is only 1.5 mm, therefore the stenosis could be functionally greater than that measured using NASCET criteria. 3. 30% stenosis of the dominant right vertebral artery origin. 50% stenosis of the non dominant left vertebral artery origin. 4. Occlusion of the right MCA inferior division M2 branch. Stenosis the right MCA superior division M2 branch which could be due to additional nonocclusive embolus. 5. Emphysema. At least partial collapse of the left lower lobe. 6. Emphysema and aortic atherosclerosis. Aortic Atherosclerosis (ICD10-I70.0) and Emphysema (ICD10-J43.9). Electronically Signed   By: Paulina Fusi M.D.   On: 10/19/2020 10:12   DG Chest Portable 1 View  Result Date: 10/18/2020 CLINICAL DATA:  Altered, unknown down time, fixed lateral gaze EXAM: PORTABLE CHEST 1 VIEW COMPARISON:  Radiograph 01/04/2017 FINDINGS: Coarsened interstitial changes and hyperinflation are similar to prior. No consolidation, features of edema, pneumothorax, or effusion. The aorta is calcified. The remaining cardiomediastinal contours are unremarkable. AICD battery pack overlies the left chest wall with leads in stable position to comparison imaging. External support devices overlie the chest. No acute osseous or soft tissue abnormality. Prior cervical fusion. IMPRESSION: Stable chest radiograph. No acute cardiopulmonary abnormality. Aortic Atherosclerosis (ICD10-I70.0). Electronically Signed   By: Kreg Shropshire M.D.   On: 10/18/2020 15:46   DG Hip Unilat W or Wo Pelvis 2-3 Views Left  Result Date: 10/18/2020 CLINICAL DATA:  Fall EXAM: DG HIP (WITH OR WITHOUT PELVIS) 2-3V LEFT COMPARISON:  None. FINDINGS: There is no  evidence of hip fracture or dislocation. There is no evidence of arthropathy or other focal bone abnormality. Hip joints and SI joints symmetric and unremarkable. IMPRESSION: Negative. Electronically Signed   By: Charlett Nose M.D.   On: 10/18/2020 19:40    PHYSICAL EXAM  Temp:  [97.3 F (36.3 C)-98.2 F (36.8 C)] 97.7 F (36.5 C) (12/13 2000) Pulse Rate:  [54-91] 69 (12/13 2128) Resp:  [15-36] 16 (12/13 2128) BP: (98-155)/(55-94) 98/55 (12/13 2128) SpO2:  [70 %-100 %] 88 % (12/13 2128) Weight:  [52.8 kg] 52.8 kg (12/13 0400)  General - Well nourished, well developed, lethargic and mildly sleepy.  Ophthalmologic - fundi not visualized due to noncooperation.  Cardiovascular - Regular rhythm and rate, paced.  Neuro - lethargic and mildly sleepy, however, easily arousable.  Answer question appropriately, orientated to time place and people.  Able to follow simple commands.  However moderate to severe dysarthria.  Left visual neglect, hemianopia, right gaze preference, barely cross midline.  PERRL.  Left facial droop, tongue protrusion to the left.  Left upper extremity flaccid, left lower extremity 1/5.  Right upper and lower extremity at least 4/5.  Sensation subjectively symmetrical, however concerning for left sensory neglect.  Right finger-to-nose intact.  Gait not tested   ASSESSMENT/PLAN Gary [name] is a 69 y.o. male with history of HTN, HLD, anxiety/depression, CAD, AF not on [facility] who was found down presenting to [facility] with AMS, L sided weakness. Not a tPA or IR candidate. Transferred to [facility].  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 Large vessel source   CT head large R frontal and parietal lobes hypodensity w/ minimal mass effect. Small vessel disease.   CTA head & neck R MCA infarct.R ICA soft and calcified plaque w/ 50% bulb stenosis. R VA origin 30%. L VA origin 50%. R M2 inferior  branch occlusion. R M2 superior stenosis. LLL emphysema. Aortic atherosclerosis.  2D Echo pending  LDL 190  HgbA1c 5.9  UDS neg   VTE prophylaxis - Lovenox 40 mg sq daily   No antithrombotic prior to admission, now on aspirin 325 per tube.   Therapy recommendations:  pending   Disposition:  pending   Concern for cerebral edema, treated w/ 3%, now off  Paroxysmal Atrial Fibrillation  Home anticoagulation:  none   Pacer shows AFib  Not on [facility] PTA  Per note, pt refused AC in the past, however, pt daughter stated that patient never offered anticoagulation.  Consider AC in 10-14 days post stroke to avoid hemorrhagic transformation given the large size of stroke   Carotid stenosis  CTA showed right ICA bulb extensive soft and calcified plaque, 50% stenosis of proximal ICA bulb.  Could be possible etiology for patient stroke besides A. fib not on AC  On aspirin  Not a candidate for CEA at this time, follow-up as outpatient  Hypertension  Home meds:  norvasc 5, coreg 25 bid, isosorbide 30, losartan 100  Stable . Avoid low BP . Long-term BP goal normotensive  Hyperlipidemia  Home meds:  zetia 10  Intolerant to statins (myalgia)  LDL 190, goal < 70  Continue Zetia at discharge  Dysphagia . Secondary to stroke . NPO . Cortrak w/ TF @ 45 and IVF @ 35 . Speech on board   Other Stroke Risk Factors  Advanced Age >/= 58   Former Cigarette smoker, quit 7 yrs ago  Coronary artery disease s/p NSTEMI in 2014  Ischemic cardiomyopathy    Hx VT in setting on NSTEMI w/ [employer] pacer  Other Active Problems  Prolonged immobilization, found down   Hypokalemia K 2.3->3.3   Mild leukocytosis WBC 20.4->14.0. CXR neg. UA WBC 0-5   Pacer - not MRI compatible    Hospital day # 1  This patient is critically ill due to large right MCA stroke, A. fib not on AC, right carotid stenosis, dysphagia, leukocytosis and at significant risk of neurological worsening,  death form recurrent stroke, hemorrhagic conversion, cerebral edema, brain herniation, heart failure, sepsis, seizure. This patient's care requires constant monitoring of vital signs, hemodynamics, respiratory and cardiac monitoring, review of multiple databases, neurological assessment, discussion with family, other specialists and medical decision making of high complexity. I spent 40 minutes of neurocritical care time in the care of this patient. I had long discussion with daughter at bedside, updated pt current condition, treatment plan and potential prognosis, and answered all the questions.  She expressed understanding and appreciation.    Marvel Plan, MD PhD Stroke Neurology 10/19/2020 10:58 PM  To contact Stroke Continuity provider, please refer to [email or web address]. After hours, contact General Neurology

## 2020-10-19 NOTE — ED Notes (Signed)
ED MD to bedside. Critical care MD paged per order.

## 2020-10-19 NOTE — ED Provider Notes (Signed)
Patient transferred here from [facility].  He was apparently found down after not being seen for several days.  He was found to have a large right-sided MCA territory stroke.  He was sent here for ICU admission.  Patient awaiting bed upstairs.  I have evaluated him and he appears stable from an airway and cardiovascular standpoint.  He does show a left-sided hemiparesis and appears somewhat sluggish after receiving Ativan.  Admitting team will be notified.   Geoffery Lyons, MD 10/19/20 [phone]

## 2020-10-19 NOTE — Progress Notes (Signed)
VO to stop 3% na infusion (Na 149) from Dr Nedra Hai. VO to recheck Na in one hr.

## 2020-10-19 NOTE — Procedures (Signed)
Cortrak ° °Tube Type:  Cortrak - 43 inches °Tube Location:  Left nare °Initial Placement:  Stomach °Secured by: Bridle °Technique Used to Measure Tube Placement:  Documented cm marking at nare/ corner of mouth °Cortrak Secured At:  70 cm ° ° ° °Cortrak Tube Team Note: ° °Consult received to place a Cortrak feeding tube.  ° °No x-ray is required. RN may begin using tube.  ° °If the tube becomes dislodged please keep the tube and contact the Cortrak team at [email or web address] (password [email or web address]) for replacement.  °If after hours and replacement cannot be delayed, place a NG tube and confirm placement with an abdominal x-ray.  ° ° °Gary Talamante MS, RD, LDN °Please refer to AMION for RD and/or RD on-call/weekend/after hours pager ° °

## 2020-10-19 NOTE — Progress Notes (Signed)
SLP Cancellation Note  Patient Details Name: INAKI VANTINE MRN: [id number] DOB: Apr 03, 1951   Cancelled treatment:         Out of room at procedure. Will continue efforts for swallow assessment today as schedule allows.    Royce Macadamia 10/19/2020, 10:07 AM  Breck Coons Lonell Face.Ed Nurse, children's [phone] Office [phone]

## 2020-10-20 ENCOUNTER — Other Ambulatory Visit (HOSPITAL_COMMUNITY): Payer: Medicare Other

## 2020-10-20 ENCOUNTER — Inpatient Hospital Stay (HOSPITAL_COMMUNITY): Payer: Medicare Other

## 2020-10-20 DIAGNOSIS — I10 Essential (primary) hypertension: Secondary | ICD-10-CM

## 2020-10-20 DIAGNOSIS — I6389 Other cerebral infarction: Secondary | ICD-10-CM

## 2020-10-20 LAB — ECHOCARDIOGRAM COMPLETE
Area-P 1/2: 3.56 cm2
Calc EF: 55.1 %
Height: 66 in
S' Lateral: 3.5 cm
Single Plane A2C EF: 51.7 %
Single Plane A4C EF: 59.2 %
Weight: 1862.45 oz

## 2020-10-20 LAB — CBC
HCT: 43.2 % (ref 39.0–52.0)
Hemoglobin: 13.6 g/dL (ref 13.0–17.0)
MCH: 30.6 pg (ref 26.0–34.0)
MCHC: 31.5 g/dL (ref 30.0–36.0)
MCV: 97.3 fL (ref 80.0–100.0)
Platelets: 157 10*3/uL (ref 150–400)
RBC: 4.44 MIL/uL (ref 4.22–5.81)
RDW: 14.2 % (ref 11.5–15.5)
WBC: 16 10*3/uL — ABNORMAL HIGH (ref 4.0–10.5)
nRBC: 0 % (ref 0.0–0.2)

## 2020-10-20 LAB — GLUCOSE, CAPILLARY
Glucose-Capillary: 131 mg/dL — ABNORMAL HIGH (ref 70–99)
Glucose-Capillary: 145 mg/dL — ABNORMAL HIGH (ref 70–99)
Glucose-Capillary: 150 mg/dL — ABNORMAL HIGH (ref 70–99)
Glucose-Capillary: 180 mg/dL — ABNORMAL HIGH (ref 70–99)

## 2020-10-20 LAB — BASIC METABOLIC PANEL
Anion gap: 10 (ref 5–15)
BUN: 42 mg/dL — ABNORMAL HIGH (ref 8–23)
CO2: 26 mmol/L (ref 22–32)
Calcium: 9.1 mg/dL (ref 8.9–10.3)
Chloride: 117 mmol/L — ABNORMAL HIGH (ref 98–111)
Creatinine, Ser: 0.86 mg/dL (ref 0.61–1.24)
GFR, Estimated: >60 mL/min (ref >60)
Glucose, Bld: 155 mg/dL — ABNORMAL HIGH (ref 70–99)
Potassium: 3.8 mmol/L (ref 3.5–5.1)
Sodium: 153 mmol/L — ABNORMAL HIGH (ref 135–145)

## 2020-10-20 LAB — PHOSPHORUS
Phosphorus: 1.8 mg/dL — ABNORMAL LOW (ref 2.5–4.6)
Phosphorus: 2.8 mg/dL (ref 2.5–4.6)

## 2020-10-20 LAB — MAGNESIUM
Magnesium: 2 mg/dL (ref 1.7–2.4)
Magnesium: 1.8 mg/dL (ref 1.7–2.4)

## 2020-10-20 MED ORDER — POTASSIUM PHOSPHATES 15 MMOLE/5ML IV SOLN
20.0000 mmol | Freq: Once | INTRAVENOUS | Status: AC
  Administered 2020-10-20: 20 mmol via INTRAVENOUS
  Filled 2020-10-20: qty 6.67

## 2020-10-20 MED ORDER — DEXTROSE-NACL 5-0.45 % IV SOLN: INTRAVENOUS | Status: DC

## 2020-10-20 MED ORDER — SODIUM CHLORIDE 0.9 % IV SOLN: INTRAVENOUS | Status: DC

## 2020-10-20 MED ORDER — ASPIRIN 325 MG PO TABS
ORAL_TABLET | ORAL | Status: AC
  Administered 2020-10-20: 325 mg
  Filled 2020-10-20: qty 1

## 2020-10-20 MED ORDER — ASPIRIN 325 MG PO TABS
325.0000 mg | ORAL_TABLET | Freq: Every day | ORAL | Status: DC
  Administered 2020-10-20 – 2020-10-22 (×3): 325 mg
  Filled 2020-10-20 (×2): qty 1
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
## 2020-10-20 NOTE — Progress Notes (Signed)
Physical Therapy Treatment Patient Details Name: Gary [name] MRN: [id number] DOB: 1950-12-31 Today's Date: 10/20/2020    History of Present Illness The pt is a 69 yo male presenting after being found down at home. Upon work up, pt found to have "large hypodensity of right frontal and parietal lobes involving almost the entire right MCA territory." PMH includes: CAD with NSTEMI 2014 s/p balloon stent placement, HLD, HTN, cardiomyopathy, MR, pacemaker, and afib.    PT Comments    Patient sleepy after returning from [facility] however happy to be able to drink coke. Highly motivated to get moving and OOB. Requires mod A for bed mobility with assist for LLE/LUE and trunk to get to EOB. Pt impulsive with movement needing cues for safety/balance. Requires Max A of 2 for SPT to chair with difficulty advancing LEs. Worked on standing from chair and upright posture. VSS on 5L/min 02 [place]. Instructed pt in there ex. Continues to be appropriate for CIR. Will follow.   Follow Up Recommendations  CIR     Equipment Recommendations  Other (comment) (defer)    Recommendations for Other Services       Precautions / Restrictions Precautions Precautions: Fall Precaution Comments: Lft hemiplegia and neglect Restrictions Weight Bearing Restrictions: No    Mobility  Bed Mobility Overal bed mobility: Needs Assistance Bed Mobility: Supine to Sit     Supine to sit: Mod assist;+2 for physical assistance     General bed mobility comments: Assist with LLE, trunk and scooting bottom to EOB, impulsive with movement with forward lean  Transfers Overall transfer level: Needs assistance Equipment used: 2 person hand held assist Transfers: Sit to/from Stand Sit to Stand: Max assist;+2 physical assistance;+2 safety/equipment   Squat pivot transfers: Max assist;+2 safety/equipment     General transfer comment: Assist of 2 to stand from EOB x1, from chair x3 with cues for urpight posture, pushing through  LUE/LLE and hip/neck extension. Supporting left knee as needed. SPT bed to chair with Max A of 2. Difficulty moving LEs to assist with transfer.  Ambulation/Gait             General Gait Details: Unable   Social research officer, government Rankin (Stroke Patients Only) Modified Rankin (Stroke Patients Only) Pre-Morbid Rankin Score: Slight disability Modified Rankin: Severe disability     Balance Overall balance assessment: Needs assistance Sitting-balance support: Single extremity supported;Feet supported Sitting balance-Leahy Scale: Poor Sitting balance - Comments: modA with UE support, anterior lean. Worked on WB through LUE in chair and pushing off to left towards right x6 to activate trunk. Postural control: Posterior lean;Left lateral lean Standing balance support: During functional activity;Bilateral upper extremity supported Standing balance-Leahy Scale: Zero Standing balance comment: maxA of 2 to maintain                            Cognition Arousal/Alertness: Awake/alert Behavior During Therapy: Flat affect;Impulsive Overall Cognitive Status: Impaired/Different from baseline Area of Impairment: Attention;Memory;Following commands;Safety/judgement;Problem solving;Awareness                   Current Attention Level: Focused;Sustained Memory: Decreased short-term memory Following Commands: Follows one step commands with increased time;Follows one step commands inconsistently Safety/Judgement: Decreased awareness of safety;Decreased awareness of deficits Awareness: Intellectual Problem Solving: Difficulty sequencing;Requires tactile cues;Requires verbal cues        Exercises General Exercises - Lower  Extremity Quad Sets: AROM;AAROM;Both;10 reps;Seated Long Arc Quad: AAROM;Left;5 reps;Seated    General Comments General comments (skin integrity, edema, etc.): Daughter present during session. Sp02 in 90s on 5L/min 02  [place].      Pertinent Vitals/Pain Pain Assessment: Faces Faces Pain Scale: Hurts a little bit Pain Location: chest Pain Descriptors / Indicators: Sore Pain Intervention(s): Monitored during session;Repositioned    Home Living                      Prior Function            PT Goals (current goals can now be found in the care plan section) Progress towards PT goals: Progressing toward goals    Frequency    Min 4X/week      PT Plan Current plan remains appropriate    Co-evaluation              AM-PAC PT "6 Clicks" Mobility   Outcome Measure  Help needed turning from your back to your side while in a flat bed without using bedrails?: A Lot Help needed moving from lying on your back to sitting on the side of a flat bed without using bedrails?: A Lot Help needed moving to and from a bed to a chair (including a wheelchair)?: Total Help needed standing up from a chair using your arms (e.g., wheelchair or bedside chair)?: A Lot Help needed to walk in hospital room?: Total Help needed climbing 3-5 steps with a railing? : Total 6 Click Score: 9    End of Session Equipment Utilized During Treatment: Gait belt;Oxygen Activity Tolerance: Patient tolerated treatment well;Patient limited by fatigue Patient left: in chair;with call bell/phone within reach;with chair alarm set;with nursing/sitter in room;with family/visitor present Nurse Communication: Mobility status PT Visit Diagnosis: Unsteadiness on feet (R26.81);Other abnormalities of gait and mobility (R26.89);Hemiplegia and hemiparesis Hemiplegia - Right/Left: Left Hemiplegia - dominant/non-dominant: Non-dominant Hemiplegia - caused by: Cerebral infarction     Time: 1103-1130 PT Time Calculation (min) (ACUTE ONLY): 27 min  Charges:  $Therapeutic Activity: 8-22 mins $Neuromuscular Re-education: 8-22 mins                     Vale Haven, PT, DPT Acute Rehabilitation Services Pager [phone] Office  [phone]       Blake Divine A Lanier Ensign 10/20/2020, 1:08 PM

## 2020-10-20 NOTE — Progress Notes (Signed)
  Echocardiogram 2D Echocardiogram has been performed.  Gary [name] 10/20/2020, 9:42 AM

## 2020-10-20 NOTE — Progress Notes (Signed)
We will sign off  Call as needed

## 2020-10-20 NOTE — Progress Notes (Signed)
  Speech Language Pathology Patient Details Name: Gary [name] MRN: [id number] DOB: 23-Apr-1951 Today's Date: 10/20/2020 Time:  -     MBS scheduled for today at 10:00                    Royce Macadamia 10/20/2020, 8:18 AM   Breck Coons Lonell Face.Ed Nurse, children's [phone] Office [phone]
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
## 2020-10-20 NOTE — Progress Notes (Signed)
Pt folding over, trying to get out of bed on right side repeatedly. He is yelling out as well. VO obtained from Dr Wynona Neat for soft waist restraint to prevent fall from bed.

## 2020-10-20 NOTE — Progress Notes (Signed)
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 demonstrated moderate oral, minimal pharyngeal dysphagia and mild cervical esophageal dysphagia and suspect esophageal. CN VII deficits led to left buccal pocketing, anterior spill, sublingual residue and delayed tranist with liquid consistencies. Overall, pharyngeal phase marked by normal occuring flash penetration with straw sip thin liquid. Timing was minimally delayed to vallecular level and laryngeal closure was good. One instance of incomplete inversion of epiglottis and min-moderate vallecular and pyriform sinus residue. UES prematurely closed on one occasion. Scanned esophagus which was full of barium and refluxing toward upper esophagus. Also appeared to have suspected outpouching of esophagus (diverticulum?) -MBS does not diagnose below the level of the UES. After several minutes majority of esophagus seemed clear. Further esophageal work up may be beneficial  Recommend full liquid diet to easier  transit through esophagus and decrease buccal cavity pocketing. Educated Charity fundraiser and dtr to recommendations including: remain upright 30 min after meals, eat slowly, take breaks. small sips, cough intermittently and crush medsl. SLP Visit Diagnosis Dysphagia, oropharyngeal phase (R13.12) Attention and concentration deficit following -- Frontal lobe and executive function deficit following -- Impact on safety and function Mild aspiration risk   [facility] IP TREATMENT RECOMMENDATION 10/20/2020 Treatment Recommendations Therapy as outlined in treatment plan below   Prognosis 10/20/2020  Prognosis for Safe Diet Advancement Good Barriers to Reach Goals Cognitive deficits Barriers/Prognosis Comment -- [facility] IP DIET RECOMMENDATION 10/20/2020 SLP Diet Recommendations Thin liquid;Other (Comment) Liquid Administration via Cup;Straw Medication Administration Crushed with puree Compensations Minimize environmental distractions;Slow rate;Small sips/bites;Lingual sweep for clearance of pocketing Postural Changes Seated upright at 90 degrees;Remain semi-upright after after feeds/meals (Comment)   [facility] IP OTHER RECOMMENDATIONS 10/20/2020 Recommended Consults Consider GI evaluation Oral Care Recommendations Oral care BID Other Recommendations --   [facility] IP FOLLOW UP RECOMMENDATIONS 10/20/2020 Follow up Recommendations Skilled Nursing facility   [facility] IP FREQUENCY AND DURATION 10/20/2020 Speech Therapy Frequency (ACUTE ONLY) min 2x/week Treatment Duration 2 weeks      CHL IP ORAL PHASE 10/20/2020 Oral Phase Impaired Oral - Pudding Teaspoon -- Oral - Pudding Cup -- Oral - Honey Teaspoon Left pocketing in lateral sulci Oral - Honey Cup -- Oral - Nectar Teaspoon Left pocketing in lateral sulci Oral - Nectar Cup Left pocketing in lateral sulci Oral - Nectar Straw -- Oral - Thin Teaspoon -- Oral - Thin Cup Left pocketing in lateral sulci;Delayed oral transit;Other (Comment) Oral - Thin Straw Decreased bolus cohesion;Left pocketing in lateral sulci;Premature  spillage Oral - Puree Left pocketing in lateral sulci Oral - Mech Soft -- Oral - Regular -- Oral - Multi-Consistency Left pocketing in lateral sulci;Delayed oral transit Oral - Pill -- Oral Phase - Comment --  CH[facility] IP PHARYNGEAL PHASE 10/20/2020 Pharyngeal Phase Impaired Pharyngeal- Pudding Teaspoon -- Pharyngeal -- Pharyngeal- Pudding Cup -- Pharyngeal -- Pharyngeal- Honey Teaspoon Pharyngeal residue - valleculae Pharyngeal -- Pharyngeal- Honey Cup -- Pharyngeal -- Pharyngeal- Nectar Teaspoon WFL Pharyngeal -- Pharyngeal- Nectar Cup Delayed swallow initiation-vallecula Pharyngeal -- Pharyngeal- Nectar Straw -- Pharyngeal -- Pharyngeal- Thin Teaspoon -- Pharyngeal -- Pharyngeal- Thin Cup Delayed swallow initiation-vallecula Pharyngeal -- Pharyngeal- Thin Straw Penetration/Aspiration during swallow;Reduced epiglottic inversion;Pharyngeal residue - valleculae;Pharyngeal residue - pyriform Pharyngeal Material enters airway, remains ABOVE vocal cords then ejected out Pharyngeal- Puree WFL Pharyngeal -- Pharyngeal- Mechanical Soft -- Pharyngeal -- Pharyngeal- Regular -- Pharyngeal -- Pharyngeal- Multi-consistency Pharyngeal residue - valleculae Pharyngeal -- Pharyngeal- Pill -- Pharyngeal -- Pharyngeal Comment --  CHL IP CERVICAL ESOPHAGEAL PHASE 10/20/2020 Cervical Esophageal Phase Impaired Pudding Teaspoon -- Pudding Cup -- Honey Teaspoon -- Honey Cup -- Nectar Teaspoon -- Nectar Cup -- Nectar Straw -- Thin Teaspoon -- Thin Cup -- Thin Straw -- Puree -- Mechanical Soft -- Regular -- Multi-consistency -- Pill -- Cervical Esophageal Comment -- Royce Macadamia 10/20/2020, 1:21 PM  Breck Coons Litaker M.Ed Sports administrator Pager [phone] Office [phone]             ECHOCARDIOGRAM COMPLETE  Result Date: 10/20/2020    ECHOCARDIOGRAM REPORT   Patient Name:   Gary [name] Date of Exam: 10/20/2020 Medical Rec #:  [id number]    Height:       66.0 in Accession #:    [id number]   Weight:       116.4 lb  Date of Birth:  1951/02/27   BSA:          1.589 m Patient Age:    69 years     BP:           108/52 mmHg Patient Gender: M            HR:           66 bpm. Exam Location:  Inpatient Procedure: 2D Echo, Cardiac Doppler and Color Doppler Indications:    Stroke 434.91 / C789.9  History:        Patient has prior history of Echocardiogram examinations, most                 recent 10/25/2019. CAD, Arrythmias:RBBB and Atrial Fibrillation;                 Risk Factors:Hypertension, Dyslipidemia and Former Smoker.  Sonographer:    Renella Cunas RDCS Referring Phys: [id number]am [facility]  Sonographer Comments: Bandages in apical region. IMPRESSIONS  1. Left ventricular ejection fraction, by estimation, is 50 to 55%. The left ventricle has low normal function. The left ventricle has no regional wall motion abnormalities. Left ventricular diastolic parameters are consistent with Grade I diastolic dysfunction (impaired relaxation).  2. Right ventricular systolic function is mildly reduced. The right ventricular size is normal.  3. The mitral valve is normal in structure. Trivial mitral valve regurgitation.  4. The aortic valve is tricuspid. Aortic valve regurgitation is not visualized.  5. The inferior vena cava is normal in size with greater than 50% respiratory variability, suggesting right atrial pressure of 3 mmHg. Comparison(s): Compared to prior TTE on 10/25/19, there is significant improvement of LVEF to 50-55% on the current study. Conclusion(s)/Recommendation(s): No intracardiac source of embolism detected on this transthoracic study. A transesophageal echocardiogram is recommended to exclude cardiac source of embolism if clinically indicated. FINDINGS  Left Ventricle: Left ventricular ejection fraction, by estimation, is 50 to 55%. The left ventricle has low normal function. The left ventricle has no regional wall motion abnormalities. The left ventricular internal cavity size was normal in size. There is no left  ventricular hypertrophy. Abnormal (paradoxical) septal motion, consistent with RV pacemaker. Left ventricular diastolic parameters are consistent with Grade I diastolic dysfunction (impaired relaxation). Normal left ventricular filling pressure. Right Ventricle: The right ventricular size is normal. Right vetricular wall thickness was not assessed. Right ventricular systolic function is mildly reduced. Left Atrium: Left atrial size was normal in size. Right Atrium: Right atrial size was normal in size. Pericardium: There is no evidence of pericardial effusion. Mitral Valve: The mitral valve is normal in structure. Mild mitral annular calcification. Trivial mitral valve regurgitation. Tricuspid Valve: The tricuspid valve is normal in structure. Tricuspid valve regurgitation is trivial. Aortic Valve: The aortic valve is tricuspid. Aortic valve regurgitation is not visualized. Pulmonic Valve: The pulmonic valve was normal in structure. Pulmonic valve regurgitation is not visualized. Aorta: The aortic root is normal in size and structure. Venous: The inferior vena cava is normal in size with greater than 50% respiratory variability, suggesting right atrial pressure of 3 mmHg. IAS/Shunts: No atrial level shunt detected by color flow Doppler. Additional Comments: A pacer wire is visualized.  LEFT VENTRICLE PLAX 2D LVIDd:         4.80 cm     Diastology LVIDs:         3.50 cm     LV e' medial:    6.31 cm/s LV PW:         0.70 cm     LV E/e' medial:  5.1 LV IVS:        0.70 cm     LV e' lateral:   9.02 cm/s LVOT diam:     2.30 cm     LV E/e' lateral: 3.6 LV SV:         53 LV SV Index:   33 LVOT Area:     4.15 cm  LV Volumes (MOD) LV vol d, MOD A2C: 84.6 ml  LV vol d, MOD A4C: 92.7 ml LV vol s, MOD A2C: 40.9 ml LV vol s, MOD A4C: 37.8 ml LV SV MOD A2C:     43.7 ml LV SV MOD A4C:     92.7 ml LV SV MOD BP:      50.2 ml RIGHT VENTRICLE RV S prime:     10.30 cm/s TAPSE (M-mode): 1.6 cm LEFT ATRIUM           Index       RIGHT  ATRIUM          Index LA diam:      2.10 cm 1.32 cm/m  RA Area:     8.27 cm LA Vol (A2C): 17.8 ml 11.20 ml/m RA Volume:   16.20 ml 10.20 ml/m LA Vol (A4C): 21.5 ml 13.53 ml/m  AORTIC VALVE LVOT Vmax:   75.40 cm/s LVOT Vmean:  48.800 cm/s LVOT VTI:    0.128 m  AORTA Ao Root diam: 3.50 cm MITRAL VALVE MV Area (PHT): 3.56 cm    SHUNTS MV Decel Time: 213 msec    Systemic VTI:  0.13 m MV E velocity: 32.10 cm/s  Systemic Diam: 2.30 cm MV A velocity: 35.80 cm/s MV E/A ratio:  0.90 Laurance Flatten MD Electronically signed by Laurance Flatten MD Signature Date/Time: 10/20/2020/11:06:18 AM    Final    DG Hip Unilat W or Wo Pelvis 2-3 Views Left  Result Date: 10/18/2020 CLINICAL DATA:  Fall EXAM: DG HIP (WITH OR WITHOUT PELVIS) 2-3V LEFT COMPARISON:  None. FINDINGS: There is no evidence of hip fracture or dislocation. There is no evidence of arthropathy or other focal bone abnormality. Hip joints and SI joints symmetric and unremarkable. IMPRESSION: Negative. Electronically Signed   By: Charlett Nose M.D.   On: 10/18/2020 19:40    Scheduled Meds: . aspirin  325 mg Per Tube Daily  . chlorhexidine  15 mL Mouth Rinse BID  . Chlorhexidine Gluconate Cloth  6 each Topical Daily  . enoxaparin (LOVENOX) injection  40 mg Subcutaneous Q24H  . feeding supplement (PROSource TF)  45 mL Per Tube BID  . ipratropium-albuterol  3 mL Nebulization Q6H  . mouth rinse  15 mL Mouth Rinse q12n4p  . multivitamin  15 mL Per Tube Daily   Continuous Infusions: . sodium chloride 50 mL/hr at 10/20/20 1300  . feeding supplement (OSMOLITE 1.5 CAL) 45 mL/hr at 10/20/20 1300  . potassium PHOSPHATE IVPB (in mmol) 20 mmol (10/20/20 1349)     LOS: 2 days   Time spent: 40 minutes  Cherry Wittwer Estill Cotta, MD [facility]  If 7PM-7AM, please contact night-coverage [email or web address] 10/20/2020, 3:23 PM

## 2020-10-20 NOTE — Progress Notes (Signed)
Modified Barium Swallow Progress Note  Patient Details  Name: Gary [name] MRN: [id number] Date of Birth: 20-Jul-1951  Today's Date: 10/20/2020  Modified Barium Swallow completed.  Full report located under Chart Review in the Imaging Section.  Brief recommendations include the following:  Clinical Impression  Pt demonstrated moderate oral, minimal pharyngeal dysphagia and mild cervical esophageal dysphagia and suspect esophageal. CN VII deficits led to left buccal pocketing, anterior spill, sublingual residue and delayed tranist with liquid consistencies. Overall, pharyngeal phase marked by normal occuring flash penetration with straw sip thin liquid. Timing was minimally delayed to vallecular level and laryngeal closure was good. One instance of incomplete inversion of epiglottis and min-moderate vallecular and pyriform sinus residue. UES prematurely closed on one occasion. Scanned esophagus which was full of barium and refluxing toward upper esophagus. Also appeared to have suspected outpouching of esophagus (diverticulum?) -MBS does not diagnose below the level of the UES. After several minutes majority of esophagus seemed clear. Further esophageal work up may be beneficial  Recommend full liquid diet to easier transit through esophagus and decrease buccal cavity pocketing. Educated Charity fundraiser and dtr to recommendations including: remain upright 30 min after meals, eat slowly, take breaks. small sips, cough intermittently and crush medsl.   Swallow Evaluation Recommendations   Recommended Consults: Consider GI evaluation   SLP Diet Recommendations: Thin liquid;Other (Comment) (Full liquids)   Liquid Administration via: Cup;Straw   Medication Administration: Crushed with puree   Supervision: Staff to assist with self feeding;Full supervision/cueing for compensatory strategies   Compensations: Minimize environmental distractions;Slow rate;Small sips/bites;Lingual sweep for clearance of  pocketing   Postural Changes: Seated upright at 90 degrees;Remain semi-upright after after feeds/meals (Comment)   Oral Care Recommendations: Oral care BID        Royce Macadamia 10/20/2020,1:22 PM  Breck Coons Lonell Face.Ed Nurse, children's [phone] Office [phone]

## 2020-10-21 ENCOUNTER — Inpatient Hospital Stay (HOSPITAL_COMMUNITY): Payer: Medicare Other

## 2020-10-21 LAB — GLUCOSE, CAPILLARY
Glucose-Capillary: 134 mg/dL — ABNORMAL HIGH (ref 70–99)
Glucose-Capillary: 119 mg/dL — ABNORMAL HIGH (ref 70–99)
Glucose-Capillary: 109 mg/dL — ABNORMAL HIGH (ref 70–99)
Glucose-Capillary: 144 mg/dL — ABNORMAL HIGH (ref 70–99)
Glucose-Capillary: 145 mg/dL — ABNORMAL HIGH (ref 70–99)

## 2020-10-21 LAB — CBC
HCT: 35.9 % — ABNORMAL LOW (ref 39.0–52.0)
Hemoglobin: 12 g/dL — ABNORMAL LOW (ref 13.0–17.0)
MCH: 32 pg (ref 26.0–34.0)
MCHC: 33.4 g/dL (ref 30.0–36.0)
MCV: 95.7 fL (ref 80.0–100.0)
Platelets: 148 10*3/uL — ABNORMAL LOW (ref 150–400)
RBC: 3.75 MIL/uL — ABNORMAL LOW (ref 4.22–5.81)
RDW: 14.4 % (ref 11.5–15.5)
WBC: 11.6 10*3/uL — ABNORMAL HIGH (ref 4.0–10.5)
nRBC: 0 % (ref 0.0–0.2)

## 2020-10-21 LAB — BASIC METABOLIC PANEL
Anion gap: 7 (ref 5–15)
BUN: 35 mg/dL — ABNORMAL HIGH (ref 8–23)
CO2: 28 mmol/L (ref 22–32)
Calcium: 8.7 mg/dL — ABNORMAL LOW (ref 8.9–10.3)
Chloride: 111 mmol/L (ref 98–111)
Creatinine, Ser: 0.75 mg/dL (ref 0.61–1.24)
GFR, Estimated: >60 mL/min (ref >60)
Glucose, Bld: 139 mg/dL — ABNORMAL HIGH (ref 70–99)
Potassium: 3.6 mmol/L (ref 3.5–5.1)
Sodium: 146 mmol/L — ABNORMAL HIGH (ref 135–145)

## 2020-10-21 LAB — PHOSPHORUS: Phosphorus: 2.9 mg/dL (ref 2.5–4.6)

## 2020-10-21 MED ORDER — IPRATROPIUM-ALBUTEROL 0.5-2.5 (3) MG/3ML IN SOLN: 3.0000 mL | Freq: Three times a day (TID) | RESPIRATORY_TRACT | Status: DC

## 2020-10-21 MED ORDER — IPRATROPIUM-ALBUTEROL 0.5-2.5 (3) MG/3ML IN SOLN
3.0000 mL | Freq: Two times a day (BID) | RESPIRATORY_TRACT | Status: DC
  Administered 2020-10-21 – 2020-10-23 (×4): 3 mL via RESPIRATORY_TRACT
  Filled 2020-10-21 (×4): qty 3

## 2020-10-21 MED ORDER — ALUM & MAG HYDROXIDE-SIMETH 200-200-20 MG/5ML PO SUSP
30.0000 mL | ORAL | Status: DC | PRN
  Administered 2020-10-21 – 2020-10-22 (×2): 30 mL via ORAL
  Filled 2020-10-21 (×2): qty 30

## 2020-10-21 NOTE — Progress Notes (Signed)
Occupational Therapy Treatment Patient Details Name: Gary [name] MRN: [id number] DOB: [birth year] Today's Date: 10/21/2020    History of present illness The pt is a 69 yo male presenting after being found down at home. Upon work up, pt found to have "large hypodensity of right frontal and parietal lobes involving almost the entire right MCA territory." PMH includes: CAD with NSTEMI 2014 s/p balloon stent placement, HLD, HTN, cardiomyopathy, MR, pacemaker, and afib.   OT comments  OT treatment session with focus on self-care re-education, NMR, orientation to midline, functional transfers with use of Stedy, and patient/family education. Patient daughter present at bedside for duration of session. Patient able to transfer with use of Stedy and Min A +2 with visual feedback from standing mirror. In perched position in [place], patient able to scan and reach in all 4 quadrants with difficulty tracking in L superior and even more difficulty tracking to L inferior quadrant with maximal multimodal cues. Patient/family education on use of lighthouse technique and sitting on patient's L to encourage attention to L visual field. Patient continues to demonstrate L hemiplegia, visual deficits, cognitive deficits, and decreased static/dynamic sitting/standing balance requiring +2 assist for functional transfers and Max A to +2 assist for LB ADLs. Continued recommendation for CIR placement. OT to follow acutely.   Follow Up Recommendations  CIR    Equipment Recommendations  3 in 1 bedside commode;Wheelchair (measurements OT);Wheelchair cushion (measurements OT)    Recommendations for Other Services PT consult;Speech consult;Rehab consult    Precautions / Restrictions Precautions Precautions: Fall Precaution Comments: Lft hemiplegia, neglect, and visual deficits. Patient could not read at baseline. Restrictions Weight Bearing Restrictions: No       Mobility Bed Mobility Overal bed mobility: Needs  Assistance             General bed mobility comments: Patient seated in recliner upon entry.  Transfers Overall transfer level: Needs assistance Equipment used: 2 person hand held assist Transfers: Sit to/from [employer] Sit to Stand: Mod assist;+2 physical assistance;+2 safety/equipment Stand pivot transfers: Max assist;+2 physical assistance;+2 safety/equipment            Balance Overall balance assessment: Needs assistance Sitting-balance support: Single extremity supported;Feet supported Sitting balance-Leahy Scale: Poor Sitting balance - Comments: Mod A initially for orientation to midline. Progressed to Min guard to Min A with visual feedback from mirror. L pusher. Postural control: Posterior lean;Left lateral lean Standing balance support: During functional activity;Bilateral upper extremity supported Standing balance-Leahy Scale: Zero Standing balance comment: maxA of 2 to maintain                           ADL either performed or assessed with clinical judgement   ADL                                       Functional mobility during ADLs: Maximal assistance;+2 for physical assistance;+2 for safety/equipment General ADL Comments: Pt presenting with poor strength, functional use of LUE, inattention to left, vision deficits, cognitive deficits, and decreased balance. Stedy used for sit to stand and transfer from EOB to recliner with Min A +2 and multimodal cues for hand placement and orientation to midline.     Vision   Vision Assessment?: Yes Eye Alignment: Impaired (comment) ([name] gaze preference) Ocular Range of Motion: Restricted on the left (Maxmial multimodal cues to direct gaze  beyond midline to L.) Visual Fields: Left homonymous hemianopsia;Left visual field deficit   Perception     Praxis      Cognition Arousal/Alertness: Awake/alert Behavior During Therapy: Flat affect;Impulsive Overall Cognitive Status:  Impaired/Different from baseline Area of Impairment: Attention;Memory;Following commands;Safety/judgement;Problem solving;Awareness                   Current Attention Level: Focused;Sustained Memory: Decreased short-term memory Following Commands: Follows one step commands with increased time;Follows one step commands inconsistently Safety/Judgement: Decreased awareness of safety;Decreased awareness of deficits Awareness: Intellectual Problem Solving: Difficulty sequencing;Requires tactile cues;Requires verbal cues General Comments: Patient perseverating on needing "something for gas". Patient also with perseverative laugh.        Exercises     Shoulder Instructions       General Comments Daughter present at bedside.    Pertinent Vitals/ Pain       Pain Assessment: Faces Faces Pain Scale: Hurts little more Pain Location: General discomfort with movement particularly in L shoulder. Pain Descriptors / Indicators: Sore Pain Intervention(s): Limited activity within patient's tolerance;Monitored during session;Repositioned  Home Living                                          Prior Functioning/Environment              Frequency  Min 2X/week        Progress Toward Goals  OT Goals(current goals can now be found in the care plan section)  Progress towards OT goals: Progressing toward goals  Acute Rehab OT Goals Patient Stated Goal: return home OT Goal Formulation: With patient Time For Goal Achievement: 11/02/20 Potential to Achieve Goals: Good ADL Goals Pt Will Perform Grooming: with min assist;sitting Pt Will Perform Upper Body Dressing: with min assist;sitting Pt Will Perform Lower Body Dressing: with mod assist;sit to/from stand Pt Will Transfer to Toilet: with mod assist;bedside commode;stand pivot transfer Pt Will Perform Toileting - Clothing Manipulation and hygiene: with min assist;sitting/lateral leans;sit to/from  stand Additional ADL Goal #1: Pt will locate ADL items at midline 50% of time with Min cues  Plan Discharge plan remains appropriate    Co-evaluation    PT/OT/SLP Co-Evaluation/Treatment: Yes Reason for Co-Treatment: Complexity of the patient's impairments (multi-system involvement);For patient/therapist safety;To address functional/ADL transfers   OT goals addressed during session: ADL's and self-care;Strengthening/ROM;Other (comment) (NMR)      AM-PAC OT "6 Clicks" Daily Activity     Outcome Measure   Help from another person eating meals?: Total Help from another person taking care of personal grooming?: A Lot Help from another person toileting, which includes using toliet, bedpan, or urinal?: A Lot Help from another person bathing (including washing, rinsing, drying)?: A Lot Help from another person to put on and taking off regular upper body clothing?: A Lot Help from another person to put on and taking off regular lower body clothing?: Total 6 Click Score: 10    End of Session Equipment Utilized During Treatment: Gait belt  OT Visit Diagnosis: Unsteadiness on feet (R26.81);Muscle weakness (generalized) (M62.81);Other abnormalities of gait and mobility (R26.89);Pain Pain - Right/Left: Left Pain - part of body: Shoulder (cervical neck)   Activity Tolerance Patient tolerated treatment well   Patient Left in chair;with call bell/phone within reach;with family/visitor present   Nurse Communication Mobility status;Need for lift equipment Gary [name])        Time: [phone]  OT Time Calculation (min): 45 min  Charges: OT General Charges $OT Visit: 1 Visit OT Treatments $Neuromuscular Re-education: 23-37 mins  Gary [name] H. OTR/L Supplemental OT, Department of rehab services [phone]   Gary [name] H. 10/21/2020, 2:48 PM

## 2020-10-21 NOTE — Care Management Important Message (Signed)
Important Message  Patient Details  Name: Gary [name] MRN: [id number] Date of Birth: 1951-04-25   Medicare Important Message Given:  Yes  Called the room spoke with the Daughter to advised of IM giving the authorzation  to send to home.   Takima Encina 10/21/2020, 3:11 PM

## 2020-10-21 NOTE — Plan of Care (Signed)
  Problem: Education: Goal: Knowledge of General Education information will improve Description: Including pain rating scale, medication(s)/side effects and non-pharmacologic comfort measures Outcome: Progressing   Problem: Health Behavior/Discharge Planning: Goal: Ability to manage health-related needs will improve Outcome: Progressing   Problem: Clinical Measurements: Goal: Ability to maintain clinical measurements within normal limits will improve Outcome: Progressing Goal: Will remain free from infection Outcome: Progressing Goal: Diagnostic test results will improve Outcome: Progressing Goal: Respiratory complications will improve Outcome: Progressing Goal: Cardiovascular complication will be avoided Outcome: Progressing   Problem: Activity: Goal: Risk for activity intolerance will decrease Outcome: Progressing   Problem: Nutrition: Goal: Adequate nutrition will be maintained Outcome: Progressing   Problem: Coping: Goal: Level of anxiety will decrease Outcome: Progressing   Problem: Elimination: Goal: Will not experience complications related to bowel motility Outcome: Progressing Goal: Will not experience complications related to urinary retention Outcome: Progressing   Problem: Pain Managment: Goal: General experience of comfort will improve Outcome: Progressing   Problem: Safety: Goal: Ability to remain free from injury will improve Outcome: Progressing   Problem: Skin Integrity: Goal: Risk for impaired skin integrity will decrease Outcome: Progressing   Problem: Education: Goal: Knowledge of secondary prevention will improve Outcome: Progressing Goal: Knowledge of patient specific risk factors addressed and post discharge goals established will improve Outcome: Progressing Goal: Individualized Educational Video(s) Outcome: Progressing   

## 2020-10-21 NOTE — Progress Notes (Signed)
Physical Therapy Treatment Patient Details Name: Gary [name] MRN: [id number] DOB: 1951-06-12 Today's Date: 10/21/2020    History of Present Illness The pt is a 69 yo male presenting after being found down at home. Upon work up, pt found to have "large hypodensity of right frontal and parietal lobes involving almost the entire right MCA territory." PMH includes: CAD with NSTEMI 2014 s/p balloon stent placement, HLD, HTN, cardiomyopathy, MR, pacemaker, and afib.    PT Comments    Treated pt in conjunction with OT to maximize quality of session and pt tolerance to activity. Patient's daughter present at bedside for duration of session. Pt required modAx2 to come to stand from a low surface, but only minAx2 to come to stand from the stedy. Also, he required maxAx2 to take several side steps to the L to transfer between surfaces, primarily due to his R side pushing to the L preventing appropriate weight shifting and his L leg being weak resulting in him requiring a knee block to prevent buckling.  Pt demonstrated improved midline alignment in sitting and standing when provided visual feedback of a mirror anterior to him. Patient able to scan and reach in all 4 quadrants with difficulty tracking in L superior and even more difficulty tracking to L inferior quadrant with maximal multimodal cues. Patient's daughter educated on sitting on patient's L to encourage attention to L visual field. Will continue to follow acutely. Pt would greatly benefit from intensive therapies in the CIR setting as his current level of function has significantly declined from his PLOF.    Follow Up Recommendations  CIR     Equipment Recommendations  Other (comment) (defer)    Recommendations for Other Services Rehab consult     Precautions / Restrictions Precautions Precautions: Fall Precaution Comments: Lft hemiplegia, neglect, and visual deficits. Patient could not read at baseline. Restrictions Weight Bearing  Restrictions: No    Mobility  Bed Mobility Overal bed mobility: Needs Assistance             General bed mobility comments: Patient seated in recliner upon entry.  Transfers Overall transfer level: Needs assistance Equipment used: 2 person hand held assist Transfers: Sit to/from [employer] Sit to Stand: Mod assist;+2 physical assistance;+2 safety/equipment Stand pivot transfers: Max assist;+2 physical assistance;+2 safety/equipment       General transfer comment: Sit to stand from recliner to 2 HHA with modAx2 and L knee block, with pt displaying R leg extension and push. Provided verbal and tactile cues to shift weight and extend L knee to allow R foot to scoot medially to take stand step transfer to L to bed. Cues provided to attempt to shift weight to R but pt resisting and requiring extensive maxAx2 to transfer safely to EOB. Sit to stand from stedy with minAx2 and physical assistance for bilat knee and feet placement with pt's R hand holding L on bar.  Ambulation/Gait Ambulation/Gait assistance: Max assist;+2 physical assistance Gait Distance (Feet): 2 Feet Assistive device: 2 person hand held assist Gait Pattern/deviations: Step-to pattern;Shuffle;Trunk flexed;Staggering right;Decreased weight shift to right;Decreased weight shift to left Gait velocity: decreased Gait velocity interpretation: <1.31 ft/sec, indicative of household ambulator General Gait Details: Small shuffling steps, with no foot clearance on R as he scoots it on ground to bring it medially for several side steps to L to bed. MaxAx2 to shift weight bilat, block L knee, and advance legs as he pushes with R toward L.   Stairs  Wheelchair Mobility    Modified Rankin (Stroke Patients Only) Modified Rankin (Stroke Patients Only) Pre-Morbid Rankin Score: Slight disability Modified Rankin: Moderately severe disability     Balance Overall balance assessment: Needs  assistance Sitting-balance support: Single extremity supported;Feet supported Sitting balance-Leahy Scale: Poor Sitting balance - Comments: Mod A initially for orientation to midline. Progressed to Min guard to Min A with visual feedback from mirror. L pusher. Postural control: Posterior lean;Left lateral lean Standing balance support: During functional activity;Bilateral upper extremity supported Standing balance-Leahy Scale: Zero Standing balance comment: maxA of 2 to maintain in stedy or HHA.                            Cognition Arousal/Alertness: Awake/alert Behavior During Therapy: Flat affect;Impulsive Overall Cognitive Status: Impaired/Different from baseline Area of Impairment: Attention;Memory;Following commands;Safety/judgement;Problem solving;Awareness                   Current Attention Level: Focused;Sustained Memory: Decreased short-term memory Following Commands: Follows one step commands with increased time;Follows one step commands inconsistently Safety/Judgement: Decreased awareness of safety;Decreased awareness of deficits Awareness: Intellectual Problem Solving: Difficulty sequencing;Requires tactile cues;Requires verbal cues General Comments: Patient perseverating on needing "something for gas". Patient also with perseverative laugh. Repeated single step cues required to attempt to acknowledge L side and to look at self in mirror and correct posture to maintain safety.      Exercises      General Comments General comments (skin integrity, edema, etc.): Daughter present during session.      Pertinent Vitals/Pain Pain Assessment: Faces Faces Pain Scale: Hurts little more Pain Location: General discomfort with movement particularly in L shoulder. Pain Descriptors / Indicators: Sore Pain Intervention(s): Limited activity within patient's tolerance;Monitored during session;Repositioned    Home Living                      Prior  Function            PT Goals (current goals can now be found in the care plan section) Acute Rehab PT Goals Patient Stated Goal: return home PT Goal Formulation: With patient Time For Goal Achievement: 11/02/20 Potential to Achieve Goals: Good Progress towards PT goals: Progressing toward goals    Frequency    Min 4X/week      PT Plan Current plan remains appropriate    Co-evaluation PT/OT/SLP Co-Evaluation/Treatment: Yes Reason for Co-Treatment: Complexity of the patient's impairments (multi-system involvement);Necessary to address cognition/behavior during functional activity;For patient/therapist safety;To address functional/ADL transfers PT goals addressed during session: Mobility/safety with mobility;Balance;Proper use of DME OT goals addressed during session: ADL's and self-care;Strengthening/ROM;Other (comment) (NMR)      AM-PAC PT "6 Clicks" Mobility   Outcome Measure  Help needed turning from your back to your side while in a flat bed without using bedrails?: A Lot Help needed moving from lying on your back to sitting on the side of a flat bed without using bedrails?: A Lot Help needed moving to and from a bed to a chair (including a wheelchair)?: A Lot Help needed standing up from a chair using your arms (e.g., wheelchair or bedside chair)?: A Lot Help needed to walk in hospital room?: Total Help needed climbing 3-5 steps with a railing? : Total 6 Click Score: 10    End of Session Equipment Utilized During Treatment: Gait belt Activity Tolerance: Patient tolerated treatment well;Patient limited by fatigue Patient left: in chair;with call bell/phone within reach;with  family/visitor present (nurse aware no chair alarm, daughter stated she told nurse she would stay with pt to ensure pt does not get up alone) Nurse Communication: Mobility status;Need for lift equipment PT Visit Diagnosis: Unsteadiness on feet (R26.81);Other abnormalities of gait and mobility  (R26.89);Hemiplegia and hemiparesis;Muscle weakness (generalized) (M62.81);Difficulty in walking, not elsewhere classified (R26.2);Other symptoms and signs involving the nervous system (R29.898) Hemiplegia - Right/Left: Left Hemiplegia - dominant/non-dominant: Non-dominant Hemiplegia - caused by: Cerebral infarction     Time: 1740-8144 PT Time Calculation (min) (ACUTE ONLY): 43 min  Charges:  $Therapeutic Activity: 8-22 mins                     Raymond Gurney, PT, DPT Acute Rehabilitation Services  Pager: [phone] Office: [phone]    Jewel Baize 10/21/2020, 3:32 PM

## 2020-10-21 NOTE — Progress Notes (Signed)
Pt has been following commands. Tele called and stated HR dropped to 48 non-sustained. No symptoms. Pt sleeping.

## 2020-10-21 NOTE — Progress Notes (Signed)
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    Height:       66.0 in Accession #:    [id number]   Weight:       116.4 lb Date of Birth:  06-02-1951   BSA:          1.589 m Patient Age:    69 years     BP:  108/52 mmHg Patient Gender: M            HR:           66 bpm. Exam Location:  Inpatient Procedure: 2D Echo, Cardiac Doppler and Color Doppler Indications:    Stroke 434.91 / I163.9  History:        Patient has prior history of Echocardiogram examinations, most                 recent 10/25/2019. CAD, Arrythmias:RBBB and Atrial Fibrillation;                 Risk Factors:Hypertension, Dyslipidemia and Former Smoker.  Sonographer:    Renella Cunas RDCS Referring Phys: [id number] Beecham [facility]  Sonographer Comments: Bandages in apical region. IMPRESSIONS  1. Left ventricular ejection fraction, by estimation, is 50 to 55%. The left ventricle has low normal function. The  left ventricle has no regional wall motion abnormalities. Left ventricular diastolic parameters are consistent with Grade I diastolic dysfunction (impaired relaxation).  2. Right ventricular systolic function is mildly reduced. The right ventricular size is normal.  3. The mitral valve is normal in structure. Trivial mitral valve regurgitation.  4. The aortic valve is tricuspid. Aortic valve regurgitation is not visualized.  5. The inferior vena cava is normal in size with greater than 50% respiratory variability, suggesting right atrial pressure of 3 mmHg. Comparison(s): Compared to prior TTE on 10/25/19, there is significant improvement of LVEF to 50-55% on the current study. Conclusion(s)/Recommendation(s): No intracardiac source of embolism detected on this transthoracic study. A transesophageal echocardiogram is recommended to exclude cardiac source of embolism if clinically indicated. FINDINGS  Left Ventricle: Left ventricular ejection fraction, by estimation, is 50 to 55%. The left ventricle has low normal function. The left ventricle has no regional wall motion abnormalities. The left ventricular internal cavity size was normal in size. There is no left ventricular hypertrophy. Abnormal (paradoxical) septal motion, consistent with RV pacemaker. Left ventricular diastolic parameters are consistent with Grade I diastolic dysfunction (impaired relaxation). Normal left ventricular filling pressure. Right Ventricle: The right ventricular size is normal. Right vetricular wall thickness was not assessed. Right ventricular systolic function is mildly reduced. Left Atrium: Left atrial size was normal in size. Right Atrium: Right atrial size was normal in size. Pericardium: There is no evidence of pericardial effusion. Mitral Valve: The mitral valve is normal in structure. Mild mitral annular calcification. Trivial mitral valve regurgitation. Tricuspid Valve: The tricuspid valve is normal in structure. Tricuspid valve  regurgitation is trivial. Aortic Valve: The aortic valve is tricuspid. Aortic valve regurgitation is not visualized. Pulmonic Valve: The pulmonic valve was normal in structure. Pulmonic valve regurgitation is not visualized. Aorta: The aortic root is normal in size and structure. Venous: The inferior vena cava is normal in size with greater than 50% respiratory variability, suggesting right atrial pressure of 3 mmHg. IAS/Shunts: No atrial level shunt detected by color flow Doppler. Additional Comments: A pacer wire is visualized.  LEFT VENTRICLE PLAX 2D LVIDd:         4.80 cm     Diastology LVIDs:         3.50 cm     LV e' medial:    6.31 cm/s LV PW:         0.70 cm     LV E/e' medial:  5.1 LV IVS:        0.70 cm     LV e' lateral:   9.02 cm/s LVOT  diam:     2.30 cm     LV E/e' lateral: 3.6 LV SV:         53 LV SV Index:   33 LVOT Area:     4.15 cm  LV Volumes (MOD) LV vol d, MOD A2C: 84.6 ml LV vol d, MOD A4C: 92.7 ml LV vol s, MOD A2C: 40.9 ml LV vol s, MOD A4C: 37.8 ml LV SV MOD A2C:     43.7 ml LV SV MOD A4C:     92.7 ml LV SV MOD BP:      50.2 ml RIGHT VENTRICLE RV S prime:     10.30 cm/s TAPSE (M-mode): 1.6 cm LEFT ATRIUM           Index       RIGHT ATRIUM          Index LA diam:      2.10 cm 1.32 cm/m  RA Area:     8.27 cm LA Vol (A2C): 17.8 ml 11.20 ml/m RA Volume:   16.20 ml 10.20 ml/m LA Vol (A4C): 21.5 ml 13.53 ml/m  AORTIC VALVE LVOT Vmax:   75.40 cm/s LVOT Vmean:  48.800 cm/s LVOT VTI:    0.128 m  AORTA Ao Root diam: 3.50 cm MITRAL VALVE MV Area (PHT): 3.56 cm    SHUNTS MV Decel Time: 213 msec    Systemic VTI:  0.13 m MV E velocity: 32.10 cm/s  Systemic Diam: 2.30 cm MV A velocity: 35.80 cm/s MV E/A ratio:  0.90 Laurance Flatten MD Electronically signed by Laurance Flatten MD Signature Date/Time: 10/20/2020/11:06:18 AM    Final         Scheduled Meds: . aspirin  325 mg Per Tube Daily  . chlorhexidine  15 mL Mouth Rinse BID  . Chlorhexidine Gluconate Cloth  6 each Topical Daily  .  enoxaparin (LOVENOX) injection  40 mg Subcutaneous Q24H  . feeding supplement (PROSource TF)  45 mL Per Tube BID  . ipratropium-albuterol  3 mL Nebulization BID  . mouth rinse  15 mL Mouth Rinse q12n4p  . multivitamin  15 mL Per Tube Daily   Continuous Infusions: . sodium chloride 50 mL/hr at 10/21/20 1337  . feeding supplement (OSMOLITE 1.5 CAL) Stopped (10/21/20 0610)     LOS: 3 days    Time spent: 25 minutes    Hezzie Karim Arvella Merles, MD [facility] 10/21/2020, 5:20 PM     Please page though AMION or Epic secure chat:  For [employer], Higher education careers adviser
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
## 2020-10-21 NOTE — Progress Notes (Signed)
Inpatient Rehab Admissions Coordinator:   I will place a CIR consult per our protocol.   Estill Dooms, PT, DPT Admissions Coordinator [phone] 10/21/20  2:56 PM

## 2020-10-22 LAB — CBC
HCT: 36.9 % — ABNORMAL LOW (ref 39.0–52.0)
Hemoglobin: 11.9 g/dL — ABNORMAL LOW (ref 13.0–17.0)
MCH: 31.2 pg (ref 26.0–34.0)
MCHC: 32.2 g/dL (ref 30.0–36.0)
MCV: 96.9 fL (ref 80.0–100.0)
Platelets: 160 10*3/uL (ref 150–400)
RBC: 3.81 MIL/uL — ABNORMAL LOW (ref 4.22–5.81)
RDW: 14.3 % (ref 11.5–15.5)
WBC: 11.6 10*3/uL — ABNORMAL HIGH (ref 4.0–10.5)
nRBC: 0.2 % (ref 0.0–0.2)

## 2020-10-22 LAB — BASIC METABOLIC PANEL
Anion gap: 8 (ref 5–15)
BUN: 22 mg/dL (ref 8–23)
CO2: 28 mmol/L (ref 22–32)
Calcium: 8.6 mg/dL — ABNORMAL LOW (ref 8.9–10.3)
Chloride: 108 mmol/L (ref 98–111)
Creatinine, Ser: 0.69 mg/dL (ref 0.61–1.24)
GFR, Estimated: >60 mL/min (ref >60)
Glucose, Bld: 127 mg/dL — ABNORMAL HIGH (ref 70–99)
Potassium: 3.7 mmol/L (ref 3.5–5.1)
Sodium: 144 mmol/L (ref 135–145)

## 2020-10-22 LAB — GLUCOSE, CAPILLARY
Glucose-Capillary: 110 mg/dL — ABNORMAL HIGH (ref 70–99)
Glucose-Capillary: 114 mg/dL — ABNORMAL HIGH (ref 70–99)
Glucose-Capillary: 125 mg/dL — ABNORMAL HIGH (ref 70–99)
Glucose-Capillary: 143 mg/dL — ABNORMAL HIGH (ref 70–99)
Glucose-Capillary: 149 mg/dL — ABNORMAL HIGH (ref 70–99)
Glucose-Capillary: 188 mg/dL — ABNORMAL HIGH (ref 70–99)

## 2020-10-22 MED ORDER — AMLODIPINE BESYLATE 5 MG PO TABS
5.0000 mg | ORAL_TABLET | Freq: Every day | ORAL | Status: DC
  Administered 2020-10-22 – 2020-10-28 (×7): 5 mg via ORAL
  Filled 2020-10-22 (×7): qty 1

## 2020-10-22 NOTE — Consult Note (Signed)
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 M.Ed Sports administrator Pager [phone] Office [phone]             ECHOCARDIOGRAM COMPLETE  Result Date: 10/20/2020    ECHOCARDIOGRAM REPORT   Patient Name:   MAKYI LEDO Date of Exam: 10/20/2020 Medical Rec #:  [id number]    Height:       66.0 in Accession #:    [id number]   Weight:       116.4 lb Date of Birth:  01-May-1951   BSA:          1.589 m Patient Age:    69 years     BP:           108/52 mmHg Patient Gender: M            HR:           66 bpm. Exam Location:  Inpatient Procedure: 2D Echo, Cardiac Doppler and Color Doppler Indications:    Stroke 434.91 / I163.9  History:        Patient has prior history of Echocardiogram examinations, most  recent 10/25/2019. CAD, Arrythmias:RBBB and Atrial Fibrillation;                 Risk Factors:Hypertension, Dyslipidemia and Former Smoker.  Sonographer:    Renella Cunas RDCS Referring Phys: [id number] [address] [facility]  Sonographer Comments: Bandages in apical region. IMPRESSIONS  1. Left ventricular ejection fraction, by estimation, is 50 to 55%. The left ventricle has low normal function. The left ventricle has no regional wall motion abnormalities. Left ventricular diastolic parameters are consistent with Grade I  diastolic dysfunction (impaired relaxation).  2. Right ventricular systolic function is mildly reduced. The right ventricular size is normal.  3. The mitral valve is normal in structure. Trivial mitral valve regurgitation.  4. The aortic valve is tricuspid. Aortic valve regurgitation is not visualized.  5. The inferior vena cava is normal in size with greater than 50% respiratory variability, suggesting right atrial pressure of 3 mmHg. Comparison(s): Compared to prior TTE on 10/25/19, there is significant improvement of LVEF to 50-55% on the current study. Conclusion(s)/Recommendation(s): No intracardiac source of embolism detected on this transthoracic study. A transesophageal echocardiogram is recommended to exclude cardiac source of embolism if clinically indicated. FINDINGS  Left Ventricle: Left ventricular ejection fraction, by estimation, is 50 to 55%. The left ventricle has low normal function. The left ventricle has no regional wall motion abnormalities. The left ventricular internal cavity size was normal in size. There is no left ventricular hypertrophy. Abnormal (paradoxical) septal motion, consistent with RV pacemaker. Left ventricular diastolic parameters are consistent with Grade I diastolic dysfunction (impaired relaxation). Normal left ventricular filling pressure. Right Ventricle: The right ventricular size is normal. Right vetricular wall thickness was not assessed. Right ventricular systolic function is mildly reduced. Left Atrium: Left atrial size was normal in size. Right Atrium: Right atrial size was normal in size. Pericardium: There is no evidence of pericardial effusion. Mitral Valve: The mitral valve is normal in structure. Mild mitral annular calcification. Trivial mitral valve regurgitation. Tricuspid Valve: The tricuspid valve is normal in structure. Tricuspid valve regurgitation is trivial. Aortic Valve: The aortic valve is tricuspid. Aortic valve regurgitation is not visualized.  Pulmonic Valve: The pulmonic valve was normal in structure. Pulmonic valve regurgitation is not visualized. Aorta: The aortic root is normal in size and structure. Venous: The inferior vena cava is normal in size with greater than 50% respiratory variability, suggesting right atrial pressure of 3 mmHg. IAS/Shunts: No atrial level shunt detected by color flow Doppler. Additional Comments: A pacer wire is visualized.  LEFT VENTRICLE PLAX 2D LVIDd:         4.80 cm     Diastology LVIDs:         3.50 cm     LV e' medial:    6.31 cm/s LV PW:         0.70 cm     LV E/e' medial:  5.1 LV IVS:        0.70 cm     LV e' lateral:   9.02 cm/s LVOT diam:     2.30 cm     LV E/e' lateral: 3.6 LV SV:         53 LV SV Index:   33 LVOT Area:     4.15 cm  LV Volumes (MOD) LV vol d, MOD A2C: 84.6 ml LV vol d, MOD A4C: 92.7 ml LV vol s, MOD A2C: 40.9 ml LV vol s, MOD A4C: 37.8 ml LV SV MOD A2C:     43.7 ml  LV SV MOD A4C:     92.7 ml LV SV MOD BP:      50.2 ml RIGHT VENTRICLE RV S prime:     10.30 cm/s TAPSE (M-mode): 1.6 cm LEFT ATRIUM           Index       RIGHT ATRIUM          Index LA diam:      2.10 cm 1.32 cm/m  RA Area:     8.27 cm LA Vol (A2C): 17.8 ml 11.20 ml/m RA Volume:   16.20 ml 10.20 ml/m LA Vol (A4C): 21.5 ml 13.53 ml/m  AORTIC VALVE LVOT Vmax:   75.40 cm/s LVOT Vmean:  48.800 cm/s LVOT VTI:    0.128 m  AORTA Ao Root diam: 3.50 cm MITRAL VALVE MV Area (PHT): 3.56 cm    SHUNTS MV Decel Time: 213 msec    Systemic VTI:  0.13 m MV E velocity: 32.10 cm/s  Systemic Diam: 2.30 cm MV A velocity: 35.80 cm/s MV E/A ratio:  0.90 Laurance Flatten MD Electronically signed by Laurance Flatten MD Signature Date/Time: 10/20/2020/11:06:18 AM    Final     Assessment/Plan: Diagnosis: R MCA stroke with L hemiplegia, lethargy, and dysphagia 1. Does the need for close, 24 hr/day medical supervision in concert with the patient's rehab needs make it unreasonable for this patient to be served in a less intensive setting?  Yes 2. Co-Morbidities requiring supervision/potential complications: HTN, Afib- on no AC, ischemic cardiomyopathy, CAD- NSTEMI 2014; dysphagia, L hemiplegia 3. Due to bladder management, bowel management, safety, skin/wound care, disease management, medication administration, pain management and patient education, does the patient require 24 hr/day rehab nursing? Yes 4. Does the patient require coordinated care of a physician, rehab nurse, therapy disciplines of PT, OT and SLP to address physical and functional deficits in the context of the above medical diagnosis(es)? Yes Addressing deficits in the following areas: balance, endurance, locomotion, strength, transferring, bowel/bladder control, bathing, dressing, feeding, grooming, toileting, cognition and swallowing 5. Can the patient actively participate in an intensive therapy program of at least 3 hrs of therapy per day at least 5 days per week? Yes 6. The potential for patient to make measurable gains while on inpatient rehab is good and fair 7. Anticipated functional outcomes upon discharge from inpatient rehab are supervision and min assist  with PT, supervision and min assist with OT, supervision and min assist with SLP. 8. Estimated rehab length of stay to reach the above functional goals is: ~ 3 weeks 9. Anticipated discharge destination: Home 10. Overall Rehab/Functional Prognosis: good and fair  RECOMMENDATIONS: This patient's condition is appropriate for continued rehabilitative care in the following setting: CIR and SNF Patient has agreed to participate in recommended program. Potentially Note that insurance prior authorization may be required for reimbursement for recommended care.  Comment: 1. Pt is a good candidate for inpt rehab- IF family will/can provide 24/7 assistance after d/c- however daughter made this questionable- she said would have to speak with other daughter.  2. Pt needs to have a BM- suggest adding senokot and if  no BM in 4 days, give Sorbitol just to get him going.  3. Will submit for inpt rehab, with the goal of admission early next week, unless his family cannot provide 24/7 assistance 4. Will d/w Admissions coordinators 5. Thank you for this consult.      Mcarthur Rossetti Angiulli, PA-C 10/22/2020    I have personally performed a face to  face diagnostic evaluation of this patient and formulated the key components of the plan.  Additionally, I have personally reviewed laboratory data, imaging studies, as well as relevant notes and concur with the physician assistant's documentation above.

## 2020-10-22 NOTE — Progress Notes (Signed)
  Speech Language Pathology Treatment:    Patient Details Name: Gary [name] MRN: [id number] DOB: Nov 08, 1950 Today's Date: 10/22/2020 Time: 9024-0973 SLP Time Calculation (min) (ACUTE ONLY): 19 min  Assessment / Plan / Recommendation Clinical Impression  Pt seen for ongoing dysphagia treatment.  Daughter present for today's session.  Reports that pt eats softer foods at home (beans, mashed potatoes, and even hamburger) 2/2 edentulism and preexisting dysphagia.  Pt today tolerated puree and soft solid textures without any clinical s/s of aspiration.  There was mild coating of dorsal surface of tongue on R side with soft solid.  Discussed diet preferences with family.  Pt agreeable to initiation of puree diet.  Daughter is curious if NGT can be discontinued.  Sent message to MD regarding diet advancement.  Recommend pureed diet with thin liquids with pt in upright position, and remaining upright 20-30 after meal given slow esophageal clearance observed during MBSS on 12/14.  Attempted to complete cognitive linguistic evaluation.  Pt denies difficulty with word finding or comprehension and was able to participate in conversation. Pt's speech is noted to be dysarthric; pt states that this is 2/2 pain, but I suspect it is a neurologic change from baseline.  Pt declined to participate in cognitive linguistic evaluation until he gets out of bed and is able to sit in bedside chair.  He Is having significant discomfort.  Repositioned pt in bed without relief. Daughter reports some confusion at baseline. Will reattempt SLE as schedule permits.    HPI HPI: 69 yo admit for unresponsiveness. Found by family and imaging revealed to have large right frontal and parietal density involving most of the right MCA territory. PMH: cervical fusion- pt reports 20 years, HTN, protein calorie mannutrition, mitral regurgitation, neck fx 2000. Daughter reports diet at home c/w dys 3.      SLP Plan  Continue with current  plan of care       Recommendations  Diet recommendations: Dysphagia 1 (puree);Thin liquid Medication Administration: Crushed with puree Supervision: Trained caregiver to feed patient Compensations: Minimize environmental distractions;Slow rate;Small sips/bites;Lingual sweep for clearance of pocketing Postural Changes and/or Swallow Maneuvers: Seated upright 90 degrees;Upright 30-60 min after meal                Oral Care Recommendations: Oral care BID Follow up Recommendations: Skilled Nursing facility SLP Visit Diagnosis: Dysphagia, oropharyngeal phase (R13.12) Plan: Continue with current plan of care       GO                Kerrie Pleasure, MA, CCC-SLP Acute Rehabilitation Services Office: [phone]  10/22/2020, 12:18 PM

## 2020-10-22 NOTE — Plan of Care (Signed)
  Problem: Education: Goal: Knowledge of General Education information will improve Description: Including pain rating scale, medication(s)/side effects and non-pharmacologic comfort measures Outcome: Progressing   Problem: Pain Managment: Goal: General experience of comfort will improve Outcome: Progressing   Problem: Safety: Goal: Non-violent Restraint(s) Outcome: Progressing

## 2020-10-22 NOTE — Plan of Care (Signed)
  Problem: Ischemic Stroke/TIA Tissue Perfusion: Goal: Complications of ischemic stroke/TIA will be minimized Outcome: Progressing   Problem: Spontaneous Subarachnoid Hemorrhage Tissue Perfusion: Goal: Complications of Spontaneous Subarachnoid Hemorrhage will be minimized Outcome: Progressing   

## 2020-10-22 NOTE — Progress Notes (Signed)
[facility] PROGRESS NOTE    Gary [name]  OMV:[id number] DOB: August 19, 1951 DOA: 10/18/2020 PCP: Jason Coop, FNP      Brief Narrative:  Patient is 69 year old male with past medical history of A. fib-not on anticoagulation, hypertension, hyperlipidemia, ischemic cardiomyopathy, coronary artery disease presents to emergency department for unresponsiveness.    Patient was found down by family and admitted with code stroke.  Last known normal on December 8.  Initially presented to any pain and found to have right MCA territory infarction.  Seen by neurology and thought to be outside window for reperfusion.  Patient admitted to ICU for hypertonic saline administration.  Did have some shortness of breath and was placed on 15 L of oxygen.         Past Medical History:  Diagnosis Date  . Anxiety   . Arthritis    "right shoulder" (01/17/2014)  . Broken neck (HCC) 2000  . CAD (coronary artery disease)    a. NSTEMI 09/2013: - s/p balloon angioplasty of D1, c/b dissection but flow restored with prolonged balloon inflations;  b. 12/2016 Cath: LM 40ost, LAD 40p, LCX nl, RCA nl.  . Hyperlipidemia    a. intolerant to statins. b. Started on Zetia 09/2013.  Marland Kitchen Hypertension   . Ischemic cardiomyopathy    a. 09/2013: EF 30%, d/c with Lifevest. b.  s/p STJ dual chamber ICD implant 01/2014 by Dr Johney Frame  . Mitral regurgitation    a. Mild by echo 09/2013.  . Paroxysmal atrial fibrillation (HCC) 10/2016   chads2vasc score is 3.  . Protein calorie malnutrition (HCC)   . RBBB    Echocardiogram 10/2019: EF 20-25, Gr 1 DD, trivial MR, trivial TR, normal RVSF  . VT (ventricular tachycardia) (HCC)    a. On admission with NSTEMI 09/2013.      Assessment & Plan:  Right MCA territory infarct: -Secondary to embolic source for unknown atrial fibrillation-not on anticoagulation  -CT head shows large right frontal and parietal lobes hypodensity with minimal mass-effect.   Small vessel disease. -CTA head and neck shows right MCA infarct.  Right ICA soft and calcified plaque with 50% bulb stenosis.  Right M2 superior stenosis.  Patient has pacemaker which is not compatible with MRI. -Transthoracic echo shows ejection fraction of 50 to 55% with grade 1 diastolic dysfunction.,  No intracardiac source of embolism detected. -A1c: 5.9%, LDL: 190, UDS: Negative. -Continue aspirin -Appreciate neurology assistance. -Continue PT/OT/SLP.  Now on pured diet.  Will discontinue NG tube.  Acute hypoxemic respiratory failure: Resolved.  Chest x-ray shows atelectasis left lower lobe. Initially required 5 to 6 L of oxygen via high flow nasal cannula.  Currently on room air.  Paroxysmal A. fib: Not on anticoagulation at home. -Per note patient refused anticoagulation in the past. -Neurology recommended anticoagulation in 10 to 14 days post stroke to avoid hemorrhagic transformation.    Hypertension: Resume hypertensive medications.  Hyperlipidemia: LDL: 190. -Patient had side effect of myalgia with the statin -We will continue his Zetia at the time of discharge  Dysphagia: Evaluated by SLP recommended thin liquid.    Currently in.  At bed will discontinue NG tube.  MBS shows: Suspected outpouching of esophagus.  SLP recommended consider GI evaluation. -as per family-patient has chronic dysphagia. -GI consult is on hold until medically stable.  Chronic combined systolic and diastolic CHF/ischemic cardiomyopathy: Status post pacemaker placement -Echo  shows ejection fraction of 50 to 55% with grade 1 diastolic dysfunction.  Patient  appears euvolemic on exam. -Continue strict INO's and daily weight.   Hypernatremia: Improving.  Hypophosphatemia: Replenished.    Leukocytosis: Downtrending.  Likely reactive/stress induced.   Pressure Injury 10/19/20 Hip Left Deep Tissue Pressure Injury - Purple or maroon localized area of discolored intact skin or blood-filled  blister due to damage of underlying soft tissue from pressure and/or shear. (Active)  10/19/20 1419  Location: Hip  Location Orientation: Left  Staging: Deep Tissue Pressure Injury - Purple or maroon localized area of discolored intact skin or blood-filled blister due to damage of underlying soft tissue from pressure and/or shear.  Wound Description (Comments):   Present on Admission: Yes     Pressure Injury 10/19/20 Knee Right;Medial Deep Tissue Pressure Injury - Purple or maroon localized area of discolored intact skin or blood-filled blister due to damage of underlying soft tissue from pressure and/or shear. (Active)  10/19/20 1419  Location: Knee  Location Orientation: Right;Medial  Staging: Deep Tissue Pressure Injury - Purple or maroon localized area of discolored intact skin or blood-filled blister due to damage of underlying soft tissue from pressure and/or shear.  Wound Description (Comments):   Present on Admission: Yes        . aspirin  325 mg Per Tube Daily  . chlorhexidine  15 mL Mouth Rinse BID  . Chlorhexidine Gluconate Cloth  6 each Topical Daily  . enoxaparin (LOVENOX) injection  40 mg Subcutaneous Q24H  . feeding supplement (PROSource TF)  45 mL Per Tube BID  . ipratropium-albuterol  3 mL Nebulization BID  . mouth rinse  15 mL Mouth Rinse q12n4p  . multivitamin  15 mL Per Tube Daily     Current Meds  Medication Sig  . acetaminophen (TYLENOL) 500 MG tablet Take 500 mg by mouth every 6 (six) hours as needed for moderate pain.  Marland Kitchen ALPRAZolam (XANAX) 0.5 MG tablet Take 0.5 mg by mouth at bedtime as needed.  Marland Kitchen amLODipine (NORVASC) 5 MG tablet Take 5 mg by mouth daily.  Marland Kitchen aspirin EC 81 MG EC tablet Take 1 tablet (81 mg total) by mouth daily.  . carvedilol (COREG) 25 MG tablet TAKE (1) TABLET TWICE A DAY WITH MEALS (BREAKFAST AND SUPPER) (Patient taking differently: Take 25 mg by mouth 2 (two) times daily with a meal.)  . escitalopram (LEXAPRO) 5 MG tablet Take 5 mg  by mouth daily.  . isosorbide mononitrate (IMDUR) 30 MG 24 hr tablet Take 1 tablet (30 mg total) by mouth daily.  Marland Kitchen losartan (COZAAR) 100 MG tablet Take 100 mg by mouth daily.  . ONE TOUCH ULTRA TEST test strip   . ONETOUCH DELICA LANCETS 33G MISC   . senna (SENOKOT) 8.6 MG TABS tablet Take 1 tablet by mouth every other day.  . Vitamin D, Ergocalciferol, (DRISDOL) 1.25 MG (50000 UNIT) CAPS capsule Take 50,000 Units by mouth once a week.        Disposition: Status is: Inpatient  Remains inpatient appropriate because:Inpatient level of care appropriate due to severity of illness   Dispo: The patient is from: Home              Anticipated d/c is to: SNF              Anticipated d/c date is: 3 days              Patient currently is not medically stable to d/c.              MDM: The below labs  and imaging reports were reviewed and summarized above.  Medication management as above.     DVT prophylaxis: enoxaparin (LOVENOX) injection 40 mg Start: 10/19/20 1400 SCD's Start: 10/19/20 0138  Code Status: DNR Family Communication: Spoke with patient's daughter at the bedside.   Consultants:   PCCM  Neurology  Antimicrobials:   None           Subjective: None by the patient.  Objective: Lying in bed, no follow-up in respiratory distress.  Nasal feeding tube in place. Vitals:   10/22/20 0826 10/22/20 0859 10/22/20 1110 10/22/20 1603  BP: (!) 156/65  (!) 155/72 (!) 156/78  Pulse: 82  73 73  Resp: Temp: 98.3 F (36.8 C)  98.7 F (37.1 C) 97.6 F (36.4 C)  TempSrc: Oral  Oral Oral  SpO2: 93% 94% 93% 97%  Weight:      Height:        Intake/Output Summary (Last 24 hours) at 10/22/2020 1631 Last data filed at 10/22/2020 1402 Gross per 24 hour  Intake 2221 ml  Output 950 ml  Net 1271 ml   Filed Weights   10/19/20 0400 10/21/20 0310 10/22/20 0500  Weight: 52.8 kg 56.1 kg 61.6 kg    Examination: General appearance: Chronically  ill looking in the form of pain or respiratory distress.  HEENT: Anicteric, conjunctiva pink.  No nasal deformity, discharge, epistaxis.  Lips moist Skin: Warm and dry.   Cardiac: RRR, nl S1-S2, no murmurs appreciated.   Respiratory: Normal respiratory rate and rhythm.  CTAB without rales or wheezes. Abdomen: Abdomen soft.    No ascites, distension, hepatosplenomegaly.   MSK: No deformities or effusions. Neuro: Drowsy but easily arousable.  Power of 0-1 out of 5 on the left upper and lower extremity.   Psych: Flat affect.    Data Reviewed: I have personally reviewed following labs and imaging studies:  CBC: Recent Labs  Lab 10/18/20 1410 10/19/20 0501 10/20/20 0614 10/21/20 0342 10/22/20 0118  WBC 20.4* 14.0* 16.0* 11.6* 11.6*  NEUTROABS 17.2*  --   --   --   --   HGB 16.8 15.3 13.6 12.0* 11.9*  HCT 49.3 47.1 43.2 35.9* 36.9*  MCV 93.5 96.3 97.3 95.7 96.9  PLT 180 186 157 148* 160   Basic Metabolic Panel: Recent Labs  Lab 10/19/20 1226 10/19/20 1806 10/20/20 0614 10/20/20 1716 10/21/20 0342 10/22/20 0118  NA 150* 149* 153*  --  146* 144  K 4.4 3.7 3.8  --  3.6 3.7  CL 113* 115* 117*  --  111 108  CO2 --  28 28  GLUCOSE 112* 186* 155*  --  139* 127*  BUN 46* 48* 42*  --  35* 22  CREATININE 0.89 0.97 0.86  --  0.75 0.69  CALCIUM 9.2 8.8* 9.1  --  8.7* 8.6*  MG 1.9 2.0 2.0 1.8  --   --   PHOS 2.2* 2.8 1.8* 2.8 2.9  --    GFR: Estimated Creatinine Clearance: 75.9 mL/min (by C-G formula based on SCr of 0.69 mg/dL). Liver Function Tests: Recent Labs  Lab 10/18/20 1410  AST 50*  ALT 33  ALKPHOS 64  BILITOT 1.4*  PROT 6.8  ALBUMIN 3.6   No results for input(s): LIPASE, AMYLASE in the last 168 hours. No results for input(s): AMMONIA in the last 168 hours. Coagulation Profile: Recent Labs  Lab 10/18/20 1410  INR 1.1   Cardiac Enzymes: Recent Labs  Lab 10/18/20 1416 10/19/20 0501  CKTOTAL 1,905* 1,076*   BNP (last 3 results) No results for  input(s): PROBNP in the last 8760 hours. HbA1C: No results for input(s): HGBA1C in the last 72 hours. CBG: Recent Labs  Lab 10/22/20 0000 10/22/20 0354 10/22/20 0823 10/22/20 1117 10/22/20 1607  GLUCAP 125* 149* 143* 110* 188*   Lipid Profile: No results for input(s): CHOL, HDL, LDLCALC, TRIG, CHOLHDL, LDLDIRECT in the last 72 hours. Thyroid Function Tests: No results for input(s): TSH, T4TOTAL, FREET4, T3FREE, THYROIDAB in the last 72 hours. Anemia Panel: No results for input(s): VITAMINB12, FOLATE, FERRITIN, TIBC, IRON, RETICCTPCT in the last 72 hours. Urine analysis:    Component Value Date/Time   COLORURINE YELLOW 10/19/2020 1924   APPEARANCEUR HAZY (A) 10/19/2020 1924   LABSPEC 1.044 (H) 10/19/2020 1924   PHURINE 5.0 10/19/2020 1924   GLUCOSEU NEGATIVE 10/19/2020 1924   HGBUR SMALL (A) 10/19/2020 1924   BILIRUBINUR NEGATIVE 10/19/2020 1924   KETONESUR 5 (A) 10/19/2020 1924   PROTEINUR NEGATIVE 10/19/2020 1924   NITRITE NEGATIVE 10/19/2020 1924   LEUKOCYTESUR NEGATIVE 10/19/2020 1924   Sepsis Labs: @LABRCNTIP (procalcitonin:4,lacticacidven:4)  ) Recent Results (from the past 240 hour(s))  Resp Panel by RT-PCR (Flu A&B, Covid) Nasopharyngeal Swab     Status: None   Collection Time: 10/18/20  2:06 PM   Specimen: Nasopharyngeal Swab; Nasopharyngeal(NP) swabs in vial transport medium  Result Value Ref Range Status   SARS Coronavirus 2 by RT PCR NEGATIVE NEGATIVE Final    Comment: (NOTE) SARS-CoV-2 target nucleic acids are NOT DETECTED.  The SARS-CoV-2 RNA is generally detectable in upper respiratory specimens during the acute phase of infection. The lowest concentration of SARS-CoV-2 viral copies this assay can detect is 138 copies/mL. A negative result does not preclude SARS-Cov-2 infection and should not be used as the sole basis for treatment or other patient management decisions. A negative result may occur with  improper specimen collection/handling,  submission of specimen other than nasopharyngeal swab, presence of viral mutation(s) within the areas targeted by this assay, and inadequate number of viral copies(<138 copies/mL). A negative result must be combined with clinical observations, patient history, and epidemiological information. The expected result is Negative.  Fact Sheet for Patients:  14/12/21  Fact Sheet for Healthcare Providers:  BloggerCourse.com  This test is no t yet approved or cleared by the [employer] FDA and  has been authorized for detection and/or diagnosis of SARS-CoV-2 by FDA under an Emergency Use Authorization (EUA). This EUA will remain  in effect (meaning this test can be used) for the duration of the COVID-19 declaration under Section 564(b)(1) of the Act, 21 U.S.C.section 360bbb-3(b)(1), unless the authorization is terminated  or revoked sooner.       Influenza A by PCR NEGATIVE NEGATIVE Final   Influenza B by PCR NEGATIVE NEGATIVE Final    Comment: (NOTE) The Xpert Xpress SARS-CoV-2/FLU/RSV plus assay is intended as an aid in the diagnosis of influenza from Nasopharyngeal swab specimens and should not be used as a sole basis for treatment. Nasal washings and aspirates are unacceptable for Xpert Xpress SARS-CoV-2/FLU/RSV testing.  Fact Sheet for Patients: [place]  Fact Sheet for Healthcare Providers: BloggerCourse.com  This test is not yet approved or cleared by the [employer]ious[employer] FDA and has been authorized for detection and/or diagnosis of SARS-CoV-2 by FDA under an Emergency Use Authorization (EUA). This EUA will remain in effect (meaning this test can be used) for the duration of the COVID-19 declaration under Section  564(b)(1) of the Act, 21 U.S.C. section 360bbb-3(b)(1), unless the authorization is terminated or revoked.  Performed at [facility], [address], [place], [place] [address]   Blood culture (routine x 2)     Status: None (Preliminary result)   Collection Time: 10/18/20  2:17 PM   Specimen: BLOOD RIGHT FOREARM  Result Value Ref Range Status   Specimen Description BLOOD RIGHT FOREARM BOTTLES DRAWN AEROBIC ONLY  Final   Special Requests Blood Culture adequate volume  Final   Culture   Final    NO GROWTH 4 DAYS Performed at [facility], [address] [place], [place] [address]    Report Status PENDING  Incomplete  Blood culture (routine x 2)     Status: None (Preliminary result)   Collection Time: 10/18/20  3:10 PM   Specimen: Left Antecubital; Blood  Result Value Ref Range Status   Specimen Description   Final    LEFT ANTECUBITAL BOTTLES DRAWN AEROBIC AND ANAEROBIC   Special Requests Blood Culture adequate volume  Final   Culture   Final    NO GROWTH 4 DAYS Performed at [facility], [address] [place], [place] [address]    Report Status PENDING  Incomplete  MRSA PCR Screening     Status: None   Collection Time: 10/19/20  3:50 AM   Specimen: Nasal Mucosa; Nasopharyngeal  Result Value Ref Range Status   MRSA by PCR NEGATIVE NEGATIVE Final    Comment:        The GeneXpert MRSA Assay (FDA approved for NASAL specimens only), is one component of a comprehensive MRSA colonization surveillance program. It is not intended to diagnose MRSA infection nor to guide or monitor treatment for MRSA infections. Performed at [facility], [address] [place], [place] [address]          Radiology Studies: CT HEAD WO CONTRAST  Result Date: 10/21/2020 CLINICAL DATA:  69 year old male found down. Right MCA infarct. Atrial fibrillation. EXAM: CT HEAD WITHOUT CONTRAST TECHNIQUE: Contiguous axial images were obtained from the base of the skull through the vertex without intravenous contrast. COMPARISON:  Head CT 10/18/2020. FINDINGS: Brain: Stable confluent cytotoxic edema throughout much of the right MCA territory. Mild  mass effect on the right lateral ventricle, although no midline shift. No hemorrhagic transformation. Stable gray-white matter differentiation elsewhere. No ventriculomegaly. Basilar cisterns remain normal. Vascular: Calcified atherosclerosis at the skull base. Skull: No acute osseous abnormality identified. Sinuses/Orbits: Stable. 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 Other Visualized paranasal sinuses and mastoids are stable and well pneumatized. Other: Visualized orbits and scalp soft tissues are within normal limits. IMPRESSION: 1. Stable subacute large Right MCA infarct with no hemorrhagic transformation or significant mass effect. 2. No new intracranial abnormality. Electronically Signed   By: Odessa Fleming M.D.   On: 10/21/2020 08:17        Scheduled Meds: . aspirin  325 mg Per Tube Daily  . chlorhexidine  15 mL Mouth Rinse BID  . Chlorhexidine Gluconate Cloth  6 each Topical Daily  . enoxaparin (LOVENOX) injection  40 mg Subcutaneous Q24H  . feeding supplement (PROSource TF)  45 mL Per Tube BID  . ipratropium-albuterol  3 mL Nebulization BID  . mouth rinse  15 mL Mouth Rinse q12n4p  . multivitamin  15 mL Per Tube Daily   Continuous Infusions: . sodium chloride 50 mL/hr at 10/22/20 0552  . feeding supplement (OSMOLITE 1.5  CAL) 1,000 mL (10/22/20 0220)     LOS: 4 days    Time spent: 25 minutes    Angelina Venard Arvella Merles, MD [facility] 10/22/2020, 4:31 PM     Please page though AMION or Epic secure chat:  For [employer], Higher education careers adviser

## 2020-10-23 LAB — GLUCOSE, CAPILLARY
Glucose-Capillary: 101 mg/dL — ABNORMAL HIGH (ref 70–99)
Glucose-Capillary: 99 mg/dL (ref 70–99)
Glucose-Capillary: 127 mg/dL — ABNORMAL HIGH (ref 70–99)
Glucose-Capillary: 115 mg/dL — ABNORMAL HIGH (ref 70–99)

## 2020-10-23 LAB — CULTURE, BLOOD (ROUTINE X 2)
Culture: NO GROWTH
Culture: NO GROWTH
Special Requests: ADEQUATE
Special Requests: ADEQUATE

## 2020-10-23 LAB — CBC
HCT: 36.2 % — ABNORMAL LOW (ref 39.0–52.0)
Hemoglobin: 12.5 g/dL — ABNORMAL LOW (ref 13.0–17.0)
MCH: 32.5 pg (ref 26.0–34.0)
MCHC: 34.5 g/dL (ref 30.0–36.0)
MCV: 94 fL (ref 80.0–100.0)
Platelets: 170 10*3/uL (ref 150–400)
RBC: 3.85 MIL/uL — ABNORMAL LOW (ref 4.22–5.81)
RDW: 14.2 % (ref 11.5–15.5)
WBC: 12.8 10*3/uL — ABNORMAL HIGH (ref 4.0–10.5)
nRBC: 0 % (ref 0.0–0.2)

## 2020-10-23 LAB — BASIC METABOLIC PANEL
Anion gap: 8 (ref 5–15)
BUN: 16 mg/dL (ref 8–23)
CO2: 26 mmol/L (ref 22–32)
Calcium: 8.4 mg/dL — ABNORMAL LOW (ref 8.9–10.3)
Chloride: 103 mmol/L (ref 98–111)
Creatinine, Ser: 0.62 mg/dL (ref 0.61–1.24)
GFR, Estimated: >60 mL/min (ref >60)
Glucose, Bld: 102 mg/dL — ABNORMAL HIGH (ref 70–99)
Potassium: 3.9 mmol/L (ref 3.5–5.1)
Sodium: 137 mmol/L (ref 135–145)

## 2020-10-23 MED ORDER — ALBUTEROL SULFATE (2.5 MG/3ML) 0.083% IN NEBU
INHALATION_SOLUTION | RESPIRATORY_TRACT | Status: AC
  Filled 2020-10-23: qty 3

## 2020-10-23 MED ORDER — IPRATROPIUM-ALBUTEROL 0.5-2.5 (3) MG/3ML IN SOLN: 3.0000 mL | RESPIRATORY_TRACT | Status: DC | PRN

## 2020-10-23 MED ORDER — ADULT MULTIVITAMIN W/MINERALS CH
1.0000 | ORAL_TABLET | Freq: Every day | ORAL | Status: DC
  Administered 2020-10-23 – 2020-10-28 (×6): 1 via ORAL
  Filled 2020-10-23 (×6): qty 1

## 2020-10-23 MED ORDER — ALPRAZOLAM 0.5 MG PO TABS
0.5000 mg | ORAL_TABLET | Freq: Every evening | ORAL | Status: DC | PRN
  Administered 2020-10-26: 0.5 mg via ORAL
  Filled 2020-10-23: qty 1

## 2020-10-23 MED ORDER — ACETAMINOPHEN 160 MG/5ML PO SOLN: 650.0000 mg | ORAL | Status: DC | PRN

## 2020-10-23 MED ORDER — SENNA 8.6 MG PO TABS
1.0000 | ORAL_TABLET | ORAL | Status: DC
Start: 2020-10-23 — End: 2020-10-29
  Administered 2020-10-23 – 2020-10-27 (×3): 8.6 mg via ORAL
  Filled 2020-10-23 (×3): qty 1

## 2020-10-23 MED ORDER — ENSURE ENLIVE PO LIQD
237.0000 mL | Freq: Three times a day (TID) | ORAL | Status: DC
  Administered 2020-10-23 – 2020-10-28 (×12): 237 mL via ORAL
  Filled 2020-10-23: qty 237

## 2020-10-23 MED ORDER — ACETAMINOPHEN 325 MG PO TABS
650.0000 mg | ORAL_TABLET | ORAL | Status: DC | PRN
  Administered 2020-10-24: 650 mg via ORAL
  Filled 2020-10-23: qty 2

## 2020-10-23 MED ORDER — ACETAMINOPHEN 650 MG RE SUPP: 650.0000 mg | RECTAL | Status: DC | PRN

## 2020-10-23 MED ORDER — ASPIRIN EC 325 MG PO TBEC
325.0000 mg | DELAYED_RELEASE_TABLET | Freq: Every day | ORAL | Status: DC
  Administered 2020-10-23 – 2020-10-28 (×6): 325 mg via ORAL
  Filled 2020-10-23 (×6): qty 1

## 2020-10-23 MED ORDER — ESCITALOPRAM OXALATE 10 MG PO TABS
5.0000 mg | ORAL_TABLET | Freq: Every day | ORAL | Status: DC
  Administered 2020-10-23 – 2020-10-28 (×6): 5 mg via ORAL
  Filled 2020-10-23 (×6): qty 1

## 2020-10-23 MED ORDER — PROSOURCE PLUS PO LIQD
30.0000 mL | Freq: Two times a day (BID) | ORAL | Status: DC
  Administered 2020-10-23 – 2020-10-28 (×10): 30 mL via ORAL
  Filled 2020-10-23 (×12): qty 30

## 2020-10-23 NOTE — Progress Notes (Signed)
Physical Therapy Treatment Patient Details Name: Gary [name] MRN: [id number] DOB: 01-20-1951 Today's Date: 10/23/2020    History of Present Illness The pt is a 69 yo male presenting after being found down at home. Upon work up, pt found to have "large hypodensity of right frontal and parietal lobes involving almost the entire right MCA territory." PMH includes: CAD with NSTEMI 2014 s/p balloon stent placement, HLD, HTN, cardiomyopathy, MR, pacemaker, and afib.    PT Comments    Pt very willing and motivated to participate and improve. Treated pt with assistance from rehab tech this date to maximize pt safety. Continued to utilize mirror anterior to pt during sitting and standing tasks to provide visual cues for improved midline balance, but pt displays poor attention span and ability to turn head and eyes to L simultaneously. He would rotate his neck when cued but then when cued to look to L with eyes he would rotate his head or eyes to R instead. Continual difficulty coming to and maintaining eyes in midline with tendency to look to R throughout session. Provided cues to L quads and gluts along with hips to shift weight to his R and extend his L knee during standing, with pt momentarily correcting himself but then returns to pushing with R to L. ModAx1 and minAx1 to come to stand to/from stedy this date along with maintaining sitting and standing balance. ModA required to transition supine > sit EOB. Will continue to follow acutely. Current recommendations remain appropriate.   Follow Up Recommendations  CIR     Equipment Recommendations  Other (comment) (defer)    Recommendations for Other Services Rehab consult     Precautions / Restrictions Precautions Precautions: Fall Precaution Comments: Lft hemiplegia, neglect, and visual deficits. Patient could not read at baseline. Restrictions Weight Bearing Restrictions: No    Mobility  Bed Mobility Overal bed mobility: Needs  Assistance Bed Mobility: Supine to Sit     Supine to sit: Mod assist;HOB elevated     General bed mobility comments: Cued pt to bring legs to R EOB, with tactile cues and success. ModA with cues to ascend trunk.  Transfers Overall transfer level: Needs assistance Equipment used: Ambulation equipment used Transfers: Sit to/from [employer] Sit to Stand: Mod assist;+2 physical assistance;+2 safety/equipment;Min assist   Squat pivot transfers: +2 physical assistance;+2 safety/equipment;Total assist     General transfer comment: Sit to stand 1x from EOB to stedy and 1x from stedy to recliner with modAx1 and minAx1, providing verbal and tactile cues to hold L hand on bar with R and to extend L knee and shift weight to R, with mod success.  Ambulation/Gait             General Gait Details: Deferred this date.   Stairs             Wheelchair Mobility    Modified Rankin (Stroke Patients Only) Modified Rankin (Stroke Patients Only) Pre-Morbid Rankin Score: Slight disability Modified Rankin: Moderately severe disability     Balance Overall balance assessment: Needs assistance Sitting-balance support: Single extremity supported;Feet supported Sitting balance-Leahy Scale: Poor Sitting balance - Comments: Pt pushes with R to L and requires modAx1 and minAx1 to maintain sitting balance EOB and in stedy, even with vsiual cues to find and maintain midline with use of mirror. Pt able to correct momentarily but then loses attention and leans to L again. Postural control: Posterior lean;Left lateral lean Standing balance support: During functional activity;Bilateral upper extremity  supported Standing balance-Leahy Scale: Poor Standing balance comment: ModAx1 and minAx1 to maintain balance in stedy with tactile and verbal cues to shift weight to R and extend L knee, with momentary success with mirror anterior to pt to provide visual cues.                             Cognition Arousal/Alertness: Awake/alert Behavior During Therapy: Flat affect;Impulsive Overall Cognitive Status: Impaired/Different from baseline Area of Impairment: Attention;Memory;Following commands;Safety/judgement;Problem solving;Awareness                   Current Attention Level: Sustained Memory: Decreased short-term memory Following Commands: Follows one step commands with increased time;Follows one step commands inconsistently Safety/Judgement: Decreased awareness of safety;Decreased awareness of deficits Awareness: Intellectual Problem Solving: Difficulty sequencing;Requires tactile cues;Requires verbal cues;Slow processing General Comments: Patient perseverates. Repeated single step cues required to attempt to acknowledge L side and to look at self in mirror and correct posture to maintain safety, with decreased attention span. Pt impulsive coming to stand despite repeatedly being cued otherwise at times.      Exercises      General Comments General comments (skin integrity, edema, etc.): Daughter present during session and informed to recline pt if he begins to slide down in recliner; Daughter reports she will watch him continually or get nurse if she needs to leave; educated them to perform seated marching and LAQ sitting in recliner      Pertinent Vitals/Pain Pain Assessment: Faces Faces Pain Scale: Hurts even more Pain Location: beck Pain Descriptors / Indicators: Grimacing;Guarding Pain Intervention(s): Limited activity within patient's tolerance;Monitored during session;Repositioned    Home Living     Available Help at Discharge: Friend(s);Available PRN/intermittently;Family Type of Home: House              Prior Function            PT Goals (current goals can now be found in the care plan section) Acute Rehab PT Goals Patient Stated Goal: to improve PT Goal Formulation: With patient Time For Goal Achievement:  11/02/20 Potential to Achieve Goals: Good Progress towards PT goals: Progressing toward goals    Frequency    Min 4X/week      PT Plan Current plan remains appropriate    Co-evaluation              AM-PAC PT "6 Clicks" Mobility   Outcome Measure  Help needed turning from your back to your side while in a flat bed without using bedrails?: A Lot Help needed moving from lying on your back to sitting on the side of a flat bed without using bedrails?: A Lot Help needed moving to and from a bed to a chair (including a wheelchair)?: A Lot Help needed standing up from a chair using your arms (e.g., wheelchair or bedside chair)?: A Lot Help needed to walk in hospital room?: Total Help needed climbing 3-5 steps with a railing? : Total 6 Click Score: 10    End of Session Equipment Utilized During Treatment: Gait belt Activity Tolerance: Patient tolerated treatment well;Patient limited by fatigue Patient left: in chair;with call bell/phone within reach;with family/visitor present;with chair alarm set Nurse Communication: Mobility status;Need for lift equipment;Other (comment) (daughter's concerns) PT Visit Diagnosis: Unsteadiness on feet (R26.81);Other abnormalities of gait and mobility (R26.89);Hemiplegia and hemiparesis;Muscle weakness (generalized) (M62.81);Difficulty in walking, not elsewhere classified (R26.2);Other symptoms and signs involving the nervous system (R29.898) Hemiplegia - Right/Left: Left  Hemiplegia - dominant/non-dominant: Non-dominant Hemiplegia - caused by: Cerebral infarction     Time: 4917-9150 PT Time Calculation (min) (ACUTE ONLY): 41 min  Charges:  $Therapeutic Activity: 8-22 mins $Neuromuscular Re-education: 23-37 mins                     Raymond Gurney, PT, DPT Acute Rehabilitation Services  Pager: [phone] Office: [phone]    Jewel Baize 10/23/2020, 1:18 PM

## 2020-10-23 NOTE — Progress Notes (Signed)
PROGRESS NOTE  GURFATEH MCCLAIN HBZ:[id number] DOB: [birth year] DOA: 10/18/2020 PCP: Jason Coop, FNP  Brief History   Patient is 69 year old male with past medical history of A. fib-not on anticoagulation, hypertension, hyperlipidemia, ischemic cardiomyopathy, coronary artery disease presents to emergency department for unresponsiveness.   Patient was found down by family and admitted with code stroke. Last known normal on December 8. Initially presented to any pain and found to have right MCA territory infarction. Seen by neurology and thought to be outside window for reperfusion. Patient admitted to ICU for hypertonic saline administration. Did have some shortness of breath and was placed on 15 L of oxygen.  The patient's sodium improved, and his oxygen requirements improved. The patient was transferred to the floor to the care of [facility] on 10/21/2020.  On 10/22/2020 the patient passed a swallow eval and was started on a dysphagia 1 diet. NGT was removed.   Consultants  . Neurology . PCCM  Procedures  . None  Antibiotics   Anti-infectives (From admission, onward)   None    .  Subjective  The patient is sitting up at bedside in a chair. No new complaints. The patient is very slow to respond. Daughter is at bedside as well.  Objective   Vitals:  Vitals:   10/23/20 1232 10/23/20 1605  BP: 137/67 118/66  Pulse: 80 72  Resp: 18 17  Temp: 98.5 F (36.9 C) 99.1 F (37.3 C)  SpO2: 100% 97%   Exam:  Constitutional:  . The patient is awake, alert and sitting up at bedside. He appears acutely and chronically  No acute distress. Respiratory:  . No increased work of breathing. . No wheezes, rales, or rhonchi . No tactile fremitus . Diminished breath sounds bilaterally. Cardiovascular:  . Regular rate and rhythm . No murmurs, ectopy, or gallups. . No lateral PMI. No thrills. Abdomen:  . Abdomen is soft, non-tender, non-distended . No hernias,  masses, or organomegaly . Normoactive bowel sounds.  Musculoskeletal:  . No cyanosis, clubbing, or edema Skin:  . No rashes, lesions, ulcers . palpation of skin: no induration or nodules Neurologic:  . CN 2-12 intact . Sensation all 4 extremities intact Psychiatric:  . Mood is depressed, affect is flat. . Pt does not appear able to appreciate his medical situation or to make decisions regarding his health or well being.  I have personally reviewed the following:   Today's Data  . Vitals, BMP, CBC  Micro Data  . Blood cultures x 2: No growth  Imaging  . CT head without contrast . CTA head and neck . CXR  Cardiology Data  . Echocardiogram . EKG  Scheduled Meds: . (feeding supplement) PROSource Plus  30 mL Oral BID BM  . albuterol      . amLODipine  5 mg Oral Daily  . aspirin EC  325 mg Oral Daily  . chlorhexidine  15 mL Mouth Rinse BID  . Chlorhexidine Gluconate Cloth  6 each Topical Daily  . enoxaparin (LOVENOX) injection  40 mg Subcutaneous Q24H  . escitalopram  5 mg Oral Daily  . feeding supplement  237 mL Oral TID BM  . mouth rinse  15 mL Mouth Rinse q12n4p  . multivitamin with minerals  1 tablet Oral Daily  . senna  1 tablet Oral QODAY   Continuous Infusions: . sodium chloride 50 mL/hr at 10/22/20 2334    Principal Problem:   Acute CVA (cerebrovascular accident) [facility]) Active Problems:   Cardiomyopathy, ischemic-  EF 30%   CAD (coronary artery disease)   Essential hypertension   S/P ICD March 2015   Tobacco abuse   Encephalopathy   LOS: 5 days   A & P   Right MCA territory infarct: Secondary to embolic source for unknown atrial fibrillation-not on anticoagulation.CT head shows large right frontal and parietal lobes hypodensity with minimal mass-effect. Small vessel disease. CTA head and neck shows right MCA infarct. Right ICA soft and calcified plaque with 50% bulb stenosis. Right M2 superior stenosis. Patient has pacemaker which is not compatible  with MRI. Transthoracic echo shows ejection fraction of 50 to 55% with grade 1 diastolic dysfunction., No intracardiac source of embolism detected.A1c: 5.9%, LDL: 190, UDS: Negative. Per neurology recommendations: continue aspirin. PT/OT SLP Eval and treat. I  appreciate neurology assistance.  Acute hypoxemic respiratory failure: Chest x-ray shows atelectasis left lower lobe. The patient is currently saturating at 97% on 2L O2 by nasal cannula. Wean off of oxygen as tolerated 97%.  Dysphagia: The patient's NGT was removed after he was able to be advanced to a Dysphagia 1 diet. Calorie count initiated.   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   Paroxysmal A. fib: Not on anticoagulation at home. Per note patient refused anticoagulation in the past. Neurology recommended anticoagulation in 10 to 14 days post stroke to avoid hemorrhagic transformation.Monitor closely on telemetry  Hypertension: Stable. Continue to hold home BP meds for now. Monitor blood pressure closely  Hyperlipidemia: LDL: 190. Patient had side effect of myalgia with the statin. We will continue his Zetia at the time of discharge.  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 Echo from today shows ejection fraction of 50 to 55% with grade 1 diastolic dysfunction. Patient appears euvolemic on exam. Continue strict I&O's and daily weights.  Hypernatremia: Resolving.Monitor electrolytes.  Hypophosphatemia:Replenished. Repeat phosphorus level tomorrow AM.  Leukocytosis: Likely reactive/stress induced. Monitor.  Pressure injuries: POA  Pressure Injury 10/19/20 Hip Left Deep Tissue Pressure Injury - Purple or maroon localized area of discolored intact skin or blood-filled blister due to damage of underlying soft tissue from pressure and/or shear. (Active)  10/19/20 1419  Location: Hip  Location Orientation: Left  Staging: Deep Tissue Pressure Injury -  Purple or maroon localized area of discolored intact skin or blood-filled blister due to damage of underlying soft tissue from pressure and/or shear.  Wound Description (Comments):   Present on Admission: Yes     Pressure Injury 10/19/20 Knee Right;Medial Deep Tissue Pressure Injury - Purple or maroon localized area of discolored intact skin or blood-filled blister due to damage of underlying soft tissue from pressure and/or shear. (Active)  10/19/20 1419  Location: Knee  Location Orientation: Right;Medial  Staging: Deep Tissue Pressure Injury - Purple or maroon localized area of discolored intact skin or blood-filled blister due to damage of underlying soft tissue from pressure and/or shear.  Wound Description (Comments):   Present on Admission: Yes   I have seen and examined this patient myself. I have spent 34 minutes in his evaluation and care.  DVT Prophylaxis: Lovenox CODE STATUS: DNR Family Communication: I have discussed the patient with his daughter in detail. All questions answered to the best of my ability. Disposition:  Status is: Inpatient  Remains inpatient appropriate because:Inpatient level of care appropriate due to severity of illness   Dispo: The patient is from: Home              Anticipated d/c is to: SNF  Anticipated d/c date is: 2 days              Patient currently is not medically stable to d/c. Nautica Hotz, DO [facility] Direct contact: see [email or web address]  7PM-7AM contact night coverage as above 10/23/2020, 5:24 PM  LOS: 5 days
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
## 2020-10-23 NOTE — Progress Notes (Signed)
Nutrition Follow-up  DOCUMENTATION CODES:   Severe malnutrition in context of chronic illness  INTERVENTION:   Calorie count per MD, results to be provided by RD on Monday, 12/20  Ensure Enlive/Plus po TID, each supplement provides 350 kcal and 20 grams of protein (Ensure Plus only provides 13 grams of protein per serving)  Magic cup TID with meals, each supplement provides 290 kcal and 9 grams of protein  Continue MVI with minerals daily  Continue 78ml Prosource Plus po BID, each supplement provides 100 kcals and 15 grams of protein  NUTRITION DIAGNOSIS:   Severe Malnutrition related to chronic illness as evidenced by severe muscle depletion,severe fat depletion.  ongoing  GOAL:   Patient will meet greater than or equal to 90% of their needs  progressing  MONITOR:   PO intake,Supplement acceptance,Labs,I & O's,Weight trends,Skin,Diet advancement  REASON FOR ASSESSMENT:   Consult Enteral/tube feeding initiation and management  ASSESSMENT:   Pt with PMH of CAD with NSTEMI 2014 s/p stent placement, HLD, HTN, cardiomyopathy, and PAF admitted after being found down at home by family with large R MCA stroke.  12/13 cortrak placed; tip in duodenal bulb 12/14 MBS, upgraded to dysphagia 1 diet with thin liquids  MBS showed suspected outpouching of esophagus. SLP recommended GI evaluation. Per MD, will hold off on GI consult until pt is medically stable.   Although it is still showing in the pt's LDAs, it appears that the pt had his Cortrak removed yesterday. MD requested calorie count. RD to place orders for calorie count and order oral nutrition supplements for pt. RD will follow up with results on Monday, 12/20. Of note, pt is already receiving 59ml Prosource Plus po BID and is consuming it well. Pt agreeable to continue using this supplement in addition to Ensure; however, pt states that he often experiences heart burn/reflux when drinking Ensure and would like medication  to alleviate this issue. Discussed this with MD.   UOP: documented today   No PO intake documented.  Labs and medications reviewed. Pt receiving MVI with minerals daily.    Diet Order:   Diet Order            DIET - DYS 1 Room service appropriate? Yes with Assist; Fluid consistency: Thin  Diet effective now                 EDUCATION NEEDS:   No education needs have been identified at this time  Skin:  Skin Assessment: Skin Integrity Issues: Skin Integrity Issues:: DTI DTI: L hip and R knee  Last BM:  12/16  Height:   Ht Readings from Last 1 Encounters:  10/19/20 5\' 6"  (1.676 m)    Weight:   Wt Readings from Last 1 Encounters:  10/23/20 68.6 kg    Ideal Body Weight:  64.5 kg  BMI:  Body mass index is 24.41 kg/m.  Estimated Nutritional Needs:   Kcal:  1600-1800  Protein:  80-95 grams  Fluid:  >1.6 L/day    10/25/20, MS, RD, LDN RD pager number and weekend/on-call pager number located in Amion.

## 2020-10-23 NOTE — Progress Notes (Signed)
  Speech Language Pathology Treatment: Dysphagia  Patient Details Name: Gary [name] MRN: [id number] DOB: [birth year] Today's Date: 10/23/2020 Time: 2774-1287 SLP Time Calculation (min) (ACUTE ONLY): 19 min  Assessment / Plan / Recommendation Clinical Impression  Pt was seen for treatment and was cooperative during the session. Pt and his daughter were educated regarding the nature of dysarthria, and compensatory strategies to improve speech intelligibility. Pt verbalized understanding regarding all areas of education. He used compensatory strategies at the word and phrase levels with 60% accuracy increasing to 100% accuracy with cues and models for rate and overarticulation. Pt was seen with his lunch tray and tolerated puree solids and thin liquids without overt s/sx of aspiration. Pt exhibited impulsive tendencies and required cueing from this SLP and his daughter, but was inconsistently responsive to this. It is recommended that the current diet be continued and the rationale for this recommendation was discussed with the pt's daughter. She verbalized understanding and stated that she then recalled the prior SLP explaining it to her. SLP will continue to follow pt.    HPI HPI: 69 yo admit for unresponsiveness. Found by family and imaging revealed to have large right frontal and parietal density involving most of the right MCA territory. PMH: cervical fusion- pt reports 20 years, HTN, protein calorie mannutrition, mitral regurgitation, neck fx 2000. Daughter reports diet at home c/w dys 3.      SLP Plan  Continue with current plan of care  Patient needs continued Speech Lanaguage Pathology Services    Recommendations  Diet recommendations: Dysphagia 1 (puree);Thin liquid Liquids provided via: Cup;Straw Medication Administration: Crushed with puree Supervision: Full supervision/cueing for compensatory strategies Compensations: Minimize environmental distractions;Slow rate;Small  sips/bites;Lingual sweep for clearance of pocketing Postural Changes and/or Swallow Maneuvers: Seated upright 90 degrees;Upright 30-60 min after meal                Oral Care Recommendations: Oral care BID Follow up Recommendations: Skilled Nursing facility SLP Visit Diagnosis: Dysarthria and anarthria (R47.1) Plan: Continue with current plan of care       Bibi Economos I. Vear Clock, MS, CCC-SLP Acute Rehabilitation Services Office number [phone] Pager [phone]                Scheryl Marten 10/23/2020, 1:36 PM

## 2020-10-23 NOTE — Progress Notes (Signed)
Inpatient Rehabilitation-Admissions Coordinator   Followed up with pt and his daughter bedside to discuss CIR recommendations. Please see Dr. Dahlia Client note from 10/22/20 for details. Reviewed need for 24/7 support at DC after CIR stay. At this time, the family confirmed they cannot provide 24/7 A and are open to SNF placement. Notified TOC team of need for SNF. AC will sign off.   Cheri Rous, OTR/L  Rehab Admissions Coordinator  [phone] 10/23/2020 1:22 PM

## 2020-10-24 LAB — BASIC METABOLIC PANEL
Anion gap: 11 (ref 5–15)
BUN: 20 mg/dL (ref 8–23)
CO2: 26 mmol/L (ref 22–32)
Calcium: 8.4 mg/dL — ABNORMAL LOW (ref 8.9–10.3)
Chloride: 101 mmol/L (ref 98–111)
Creatinine, Ser: 0.69 mg/dL (ref 0.61–1.24)
GFR, Estimated: >60 mL/min (ref >60)
Glucose, Bld: 112 mg/dL — ABNORMAL HIGH (ref 70–99)
Potassium: 3.8 mmol/L (ref 3.5–5.1)
Sodium: 138 mmol/L (ref 135–145)

## 2020-10-24 LAB — CBC
HCT: 37.3 % — ABNORMAL LOW (ref 39.0–52.0)
Hemoglobin: 12.3 g/dL — ABNORMAL LOW (ref 13.0–17.0)
MCH: 31.2 pg (ref 26.0–34.0)
MCHC: 33 g/dL (ref 30.0–36.0)
MCV: 94.7 fL (ref 80.0–100.0)
Platelets: 184 10*3/uL (ref 150–400)
RBC: 3.94 MIL/uL — ABNORMAL LOW (ref 4.22–5.81)
RDW: 14 % (ref 11.5–15.5)
WBC: 12.7 10*3/uL — ABNORMAL HIGH (ref 4.0–10.5)
nRBC: 0 % (ref 0.0–0.2)

## 2020-10-24 LAB — GLUCOSE, CAPILLARY
Glucose-Capillary: 105 mg/dL — ABNORMAL HIGH (ref 70–99)
Glucose-Capillary: 108 mg/dL — ABNORMAL HIGH (ref 70–99)
Glucose-Capillary: 112 mg/dL — ABNORMAL HIGH (ref 70–99)
Glucose-Capillary: 113 mg/dL — ABNORMAL HIGH (ref 70–99)

## 2020-10-24 MED ORDER — TRAMADOL HCL 50 MG PO TABS
50.0000 mg | ORAL_TABLET | Freq: Four times a day (QID) | ORAL | Status: DC | PRN
  Administered 2020-10-24 – 2020-10-28 (×6): 50 mg via ORAL
  Filled 2020-10-24 (×7): qty 1

## 2020-10-24 NOTE — Progress Notes (Signed)
PROGRESS NOTE  Gary [name] OIN:[id number] DOB: 1951-08-11 DOA: 10/18/2020 PCP: Jason Coop, FNP  Brief History   Patient is 69 year old male with past medical history of A. fib-not on anticoagulation, hypertension, hyperlipidemia, ischemic cardiomyopathy, coronary artery disease presents to emergency department for unresponsiveness.   Patient was found down by family and admitted with code stroke. Last known normal on December 8. Initially presented to any pain and found to have right MCA territory infarction. Seen by neurology and thought to be outside window for reperfusion. Patient admitted to ICU for hypertonic saline administration. Did have some shortness of breath and was placed on 15 L of oxygen.  The patient's sodium improved, and his oxygen requirements improved. The patient was transferred to the floor to the care of [facility] on 10/21/2020.  On 10/22/2020 the patient passed a swallow eval and was started on a dysphagia 1 diet. NGT was removed.   Consultants  . Neurology . PCCM  Procedures  . None  Antibiotics   Anti-infectives (From admission, onward)   None     Subjective  The patient is sitting up in bed. His daughter is present and is cleaning him up.   Objective   Vitals:  Vitals:   10/24/20 1219 10/24/20 1550  BP: 123/74 (!) 145/68  Pulse: 73 72  Resp: 18 20  Temp: 98 F (36.7 C) 97.9 F (36.6 C)  SpO2: 94% 94%   Exam:  Constitutional:  . The patient is awake, alert and sitting up in bed. He appears acutely and chronically  No acute distress. Respiratory:  . No increased work of breathing. . No wheezes, rales, or rhonchi . No tactile fremitus . Diminished breath sounds bilaterally. Cardiovascular:  . Regular rate and rhythm . No murmurs, ectopy, or gallups. . No lateral PMI. No thrills. Abdomen:  . Abdomen is soft, non-tender, non-distended . No hernias, masses, or organomegaly . Normoactive bowel sounds.   Musculoskeletal:  . No cyanosis, clubbing, or edema Skin:  . No rashes, lesions, ulcers . palpation of skin: no induration or nodules Neurologic:  . CN 2-12 intact . Sensation all 4 extremities intact Psychiatric:  . Mood is depressed, affect is flat. . Pt does not appear able to appreciate his medical situation or to make decisions regarding his health or well being.  I have personally reviewed the following:   Today's Data  . Vitals, BMP, CBC  Micro Data  . Blood cultures x 2: No growth  Imaging  . CT head without contrast . CTA head and neck . CXR  Cardiology Data  . Echocardiogram . EKG  Scheduled Meds: . (feeding supplement) PROSource Plus  30 mL Oral BID BM  . amLODipine  5 mg Oral Daily  . aspirin EC  325 mg Oral Daily  . chlorhexidine  15 mL Mouth Rinse BID  . Chlorhexidine Gluconate Cloth  6 each Topical Daily  . enoxaparin (LOVENOX) injection  40 mg Subcutaneous Q24H  . escitalopram  5 mg Oral Daily  . feeding supplement  237 mL Oral TID BM  . mouth rinse  15 mL Mouth Rinse q12n4p  . multivitamin with minerals  1 tablet Oral Daily  . senna  1 tablet Oral QODAY   Continuous Infusions: . sodium chloride 50 mL/hr at 10/22/20 2334    Principal Problem:   Acute CVA (cerebrovascular accident) [facility]) Active Problems:   Cardiomyopathy, ischemic- EF 30%   CAD (coronary artery disease)   Essential hypertension   S/P ICD  March 2015   Tobacco abuse   Encephalopathy   LOS: 6 days   A & P   Right MCA territory infarct: Secondary to embolic source for unknown atrial fibrillation-not on anticoagulation.CT head shows large right frontal and parietal lobes hypodensity with minimal mass-effect. Small vessel disease. CTA head and neck shows right MCA infarct. Right ICA soft and calcified plaque with 50% bulb stenosis. Right M2 superior stenosis. Patient has pacemaker which is not compatible with MRI. Transthoracic echo shows ejection fraction of 50 to 55% with  grade 1 diastolic dysfunction., No intracardiac source of embolism detected.A1c: 5.9%, LDL: 190, UDS: Negative. Per neurology recommendations: continue aspirin. PT/OT SLP Eval and treat. I  appreciate neurology assistance.  Acute hypoxemic respiratory failure: Chest x-ray shows atelectasis left lower lobe. The patient is currently saturating at 94% on room air.  Dysphagia: The patient's NGT was removed after he was able to be advanced to a Dysphagia 1 diet. Calorie count initiated, and nutrition consulted.   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   Paroxysmal A. fib: Not on anticoagulation at home. Per note patient refused anticoagulation in the past. Neurology recommended anticoagulation in 10 to 14 days post stroke to avoid hemorrhagic transformation.Monitor closely on telemetry  Hypertension: Stable. Continue to hold home BP meds for now. Monitor blood pressure closely  Hyperlipidemia: LDL: 190. Patient had side effect of myalgia with the statin. We will continue his Zetia at the time of discharge.  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 Echo from today shows ejection fraction of 50 to 55% with grade 1 diastolic dysfunction. Patient appears euvolemic on exam. Continue strict I&O's and daily weights.  Hypernatremia: Resolving.Monitor electrolytes.  Hypophosphatemia:Replenished. Repeat phosphorus level tomorrow AM.  Leukocytosis: Likely reactive/stress induced. Monitor.  Pressure injuries: POA  Pressure Injury 10/19/20 Hip Left Deep Tissue Pressure Injury - Purple or maroon localized area of discolored intact skin or blood-filled blister due to damage of underlying soft tissue from pressure and/or shear. (Active)  10/19/20 1419  Location: Hip  Location Orientation: Left  Staging: Deep Tissue Pressure Injury - Purple or maroon localized area of discolored intact skin or blood-filled blister due to damage of  underlying soft tissue from pressure and/or shear.  Wound Description (Comments):   Present on Admission: Yes     Pressure Injury 10/19/20 Knee Right;Medial Deep Tissue Pressure Injury - Purple or maroon localized area of discolored intact skin or blood-filled blister due to damage of underlying soft tissue from pressure and/or shear. (Active)  10/19/20 1419  Location: Knee  Location Orientation: Right;Medial  Staging: Deep Tissue Pressure Injury - Purple or maroon localized area of discolored intact skin or blood-filled blister due to damage of underlying soft tissue from pressure and/or shear.  Wound Description (Comments):   Present on Admission: Yes   I have seen and examined this patient myself. I have spent 34 minutes in his evaluation and care.  DVT Prophylaxis: Lovenox CODE STATUS: DNR Family Communication: I have discussed the patient with his daughter in detail. All questions answered to the best of my ability. Disposition:  Status is: Inpatient  Remains inpatient appropriate because:Inpatient level of care appropriate due to severity of illness   Dispo: The patient is from: Home              Anticipated d/c is to: SNF              Anticipated d/c date is: 2 days  Patient currently is not medically stable to d/c. Elanie Hammitt, DO [facility] Direct contact: see [email or web address]  7PM-7AM contact night coverage as above 10/24/2020, 4:37 PM  LOS: 5 days

## 2020-10-25 LAB — GLUCOSE, CAPILLARY
Glucose-Capillary: 100 mg/dL — ABNORMAL HIGH (ref 70–99)
Glucose-Capillary: 147 mg/dL — ABNORMAL HIGH (ref 70–99)
Glucose-Capillary: 104 mg/dL — ABNORMAL HIGH (ref 70–99)
Glucose-Capillary: 107 mg/dL — ABNORMAL HIGH (ref 70–99)
Glucose-Capillary: 109 mg/dL — ABNORMAL HIGH (ref 70–99)

## 2020-10-25 NOTE — Social Work (Cosign Needed)
RE: Gary [name]  Date of Birth: 11/23/ 1952  Date: 12/19/ 2021  To Whom It May Concern:  Please be advised that the above-named patient will require a short-term nursing home stay - anticipated 30 days or less for rehabilitation and strengthening. The plan is for return home.

## 2020-10-25 NOTE — TOC Initial Note (Addendum)
Transition of Care [facility]) - Initial/Assessment Note    Patient Details  Name: Gary [name] MRN: [id number] Date of Birth: 06-29-1951  Transition of Care [facility]) CM/SW Contact:    Carley Hammed, LCSWA Phone Number: 10/25/2020, 8:46 AM  Clinical Narrative:                 CSW spoke with [name] at bedside. She noted that her and her sisters are agreeable to SNF, but in disagreement of where he should go. She asked about CIR, but they signed off due to lack of supervision after DC. Pt's dtr said that she thought they might be able to get family together and is frustrated, because she did not remember anyone talking to her about it. She stated they must have talked to her sister. CSW sent message to CIR OT for clarification. Dtr noted that she works at [employer] in [place] and would like for pt to go there. Pt's other dtr's prefer him to go to [facility] [facility] as that is close to them. She also said they would consider [facility]. She noted she will talk to her family for a final decision. CSW will faxout to facilities to get bed offers.   PASSR pending, clinicals faxed. Expected Discharge Plan: Skilled Nursing Facility Barriers to Discharge: Continued Medical Work up,Insurance Authorization,SNF Pending bed offer   Patient Goals and CMS Choice Patient states their goals for this hospitalization and ongoing recovery are:: Pt unable to participate in goal setting due to disorientation. CMS Medicare.gov Compare Post Acute Care list provided to:: Patient Represenative (must comment) Elita Quick, daughter) Choice offered to / list presented to : Adult Children  Expected Discharge Plan and Services Expected Discharge Plan: Skilled Nursing Facility     Post Acute Care Choice: Skilled Nursing Facility Living arrangements for the past 2 months: Single Family Home                                      Prior Living Arrangements/Services Living arrangements for the past 2 months:  Single Family Home Lives with:: Self Patient language and need for interpreter reviewed:: Yes Do you feel safe going back to the place where you live?: Yes      Need for Family Participation in Patient Care: Yes (Comment) Care giver support system in place?: Yes (comment)   Criminal Activity/Legal Involvement Pertinent to Current Situation/Hospitalization: No - Comment as needed  Activities of Daily Living Home Assistive Devices/Equipment: Eyeglasses ADL Screening (condition at time of admission) Patient's cognitive ability adequate to safely complete daily activities?: Yes Is the patient deaf or have difficulty hearing?: No Does the patient have difficulty seeing, even when wearing glasses/contacts?: No Does the patient have difficulty concentrating, remembering, or making decisions?: Yes Patient able to express need for assistance with ADLs?: No Does the patient have difficulty dressing or bathing?: No Independently performs ADLs?: Yes (appropriate for developmental age) Does the patient have difficulty walking or climbing stairs?: No Weakness of Legs: None Weakness of Arms/Hands: None  Permission Sought/Granted Permission sought to share information with : Family Supports Permission granted to share information with : Yes, Verbal Permission Granted  Share Information with NAME: [name]     Permission granted to share info w Relationship: Daughter  Permission granted to share info w Contact Information: [phone]  Emotional Assessment Appearance:: Appears stated age Attitude/Demeanor/Rapport: Unable to Assess Affect (typically  observed): Unable to Assess Orientation: : Oriented to Self,Oriented to Place,Oriented to  Time Alcohol / Substance Use: Not Applicable Psych Involvement: No (comment)  Admission diagnosis:  Stroke [facility]) [I63.9] Acute ischemic right middle cerebral artery (MCA) stroke (HCC) [I63.511] Acute CVA (cerebrovascular accident) [facility]) [I63.9] Patient  Active Problem List   Diagnosis Date Noted  . Acute CVA (cerebrovascular accident) (HCC) 10/18/2020  . Encephalopathy 10/18/2020  . Tobacco abuse 10/16/2018  . Chronic systolic heart failure (HCC) 03/19/2018  . Paroxysmal atrial fibrillation (HCC) 03/19/2018  . Chest pain 01/04/2017  . ICD lead fracture- revision 02/21/14 02/22/2014  . Pericardial effusion- small post lead revision 02/20/2014  . S/P ICD March 2015 02/19/2014  . Cardiomyopathy, ischemic- EF 30% 01/13/2014  . Ventricular tachycardia (HCC) 01/13/2014  . CAD (coronary artery disease) 01/13/2014  . Essential hypertension 01/13/2014  . Hypercholesterolemia 10/16/2013  . Protein-calorie malnutrition, severe (HCC) 10/08/2013  . History of non-ST elevation myocardial infarction (NSTEMI) 10/05/2013   PCP:  Jason Coop, FNP Pharmacy:   [facility] - [place] - [address] [place] [address] Phone: [phone] Fax: [phone]     Social Determinants of Health (SDOH) Interventions    Readmission Risk Interventions No flowsheet data found.

## 2020-10-25 NOTE — Progress Notes (Signed)
PROGRESS NOTE  Gary [name] LPF:[id number] DOB: [birth year] DOA: 10/18/2020 PCP: Jason Coop, FNP  Brief History   Patient is 69 year old male with past medical history of A. fib-not on anticoagulation, hypertension, hyperlipidemia, ischemic cardiomyopathy, coronary artery disease presents to emergency department for unresponsiveness.   Patient was found down by family and admitted with code stroke. Last known normal on December 8. Initially presented to any pain and found to have right MCA territory infarction. Seen by neurology and thought to be outside window for reperfusion. Patient admitted to ICU for hypertonic saline administration. Did have some shortness of breath and was placed on 15 L of oxygen.  The patient's sodium improved, and his oxygen requirements improved. The patient was transferred to the floor to the care of [facility] on 10/21/2020.  On 10/22/2020 the patient passed a swallow eval and was started on a dysphagia 1 diet. NGT was removed.   Consultants  . Neurology . PCCM  Procedures  . None  Antibiotics   Anti-infectives (From admission, onward)   None     Subjective  The patient is sitting up in bed. No new complaints.  Objective   Vitals:  Vitals:   10/25/20 0901 10/25/20 1156  BP: (!) 149/73 140/74  Pulse: 77 81  Resp: 18 18  Temp: 98.2 F (36.8 C) 98.1 F (36.7 C)  SpO2: 95% 94%   Exam:  Constitutional:  . The patient is awake, alert and sitting up in bed. He appears acutely and chronically  No acute distress. Respiratory:  . No increased work of breathing. . No wheezes, rales, or rhonchi . No tactile fremitus . Diminished breath sounds bilaterally. Cardiovascular:  . Regular rate and rhythm . No murmurs, ectopy, or gallups. . No lateral PMI. No thrills. Abdomen:  . Abdomen is soft, non-tender, non-distended . No hernias, masses, or organomegaly . Normoactive bowel sounds.  Musculoskeletal:  . No  cyanosis, clubbing, or edema Skin:  . No rashes, lesions, ulcers . palpation of skin: no induration or nodules Neurologic:  . CN 2-12 intact . Sensation all 4 extremities intact Psychiatric:  . Mood is depressed, affect is flat. . Pt does not appear able to appreciate his medical situation or to make decisions regarding his health or well being.  I have personally reviewed the following:   Today's Data  . Vitals, BMP, CBC, Glucoses  Micro Data  . Blood cultures x 2: No growth  Imaging  . CT head without contrast . CTA head and neck . CXR  Cardiology Data  . Echocardiogram . EKG  Scheduled Meds: . (feeding supplement) PROSource Plus  30 mL Oral BID BM  . amLODipine  5 mg Oral Daily  . aspirin EC  325 mg Oral Daily  . chlorhexidine  15 mL Mouth Rinse BID  . Chlorhexidine Gluconate Cloth  6 each Topical Daily  . enoxaparin (LOVENOX) injection  40 mg Subcutaneous Q24H  . escitalopram  5 mg Oral Daily  . feeding supplement  237 mL Oral TID BM  . mouth rinse  15 mL Mouth Rinse q12n4p  . multivitamin with minerals  1 tablet Oral Daily  . senna  1 tablet Oral QODAY   Continuous Infusions: . sodium chloride 50 mL/hr at 10/22/20 2334    Principal Problem:   Acute CVA (cerebrovascular accident) [facility]) Active Problems:   Cardiomyopathy, ischemic- EF 30%   CAD (coronary artery disease)   Essential hypertension   S/P ICD March 2015   Tobacco abuse  Encephalopathy   LOS: 7 days   A & P   Right MCA territory infarct: Secondary to embolic source for unknown atrial fibrillation-not on anticoagulation.CT head shows large right frontal and parietal lobes hypodensity with minimal mass-effect. Small vessel disease. CTA head and neck shows right MCA infarct. Right ICA soft and calcified plaque with 50% bulb stenosis. Right M2 superior stenosis. Patient has pacemaker which is not compatible with MRI. Transthoracic echo shows ejection fraction of 50 to 55% with grade 1  diastolic dysfunction., No intracardiac source of embolism detected.A1c: 5.9%, LDL: 190, UDS: Negative. Per neurology recommendations: continue aspirin. PT/OT SLP Eval and treat. I  appreciate neurology assistance.  Acute hypoxemic respiratory failure: Chest x-ray shows atelectasis left lower lobe. The patient is currently saturating at 94% on room air.  Dysphagia: The patient's NGT was removed after he was able to be advanced to a Dysphagia 1 diet. Calorie count initiated, and nutrition consulted.   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   Severe protein calorie malnutrition: Pt is on a calorie count and nutrition supplementation has been ordered.   Paroxysmal A. fib: Not on anticoagulation at home. Per note patient refused anticoagulation in the past. Neurology recommended anticoagulation in 10 to 14 days post stroke to avoid hemorrhagic transformation.Monitor closely on telemetry  Hypertension: Stable. Continue to hold home BP meds for now. Monitor blood pressure closely  Hyperlipidemia: LDL: 190. Patient had side effect of myalgia with the statin. We will continue his Zetia at the time of discharge.  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 Echo from today shows ejection fraction of 50 to 55% with grade 1 diastolic dysfunction. Patient appears euvolemic on exam. Continue strict I&O's and daily weights.  Hypernatremia: Resolving.Monitor electrolytes.  Hypophosphatemia:Replenished. Repeat phosphorus level tomorrow AM.  Leukocytosis: Likely reactive/stress induced. Monitor.  Pressure injuries: POA  Pressure Injury 10/19/20 Hip Left Deep Tissue Pressure Injury - Purple or maroon localized area of discolored intact skin or blood-filled blister due to damage of underlying soft tissue from pressure and/or shear. (Active)  10/19/20 1419  Location: Hip  Location Orientation: Left  Staging: Deep Tissue Pressure  Injury - Purple or maroon localized area of discolored intact skin or blood-filled blister due to damage of underlying soft tissue from pressure and/or shear.  Wound Description (Comments):   Present on Admission: Yes     Pressure Injury 10/19/20 Knee Right;Medial Deep Tissue Pressure Injury - Purple or maroon localized area of discolored intact skin or blood-filled blister due to damage of underlying soft tissue from pressure and/or shear. (Active)  10/19/20 1419  Location: Knee  Location Orientation: Right;Medial  Staging: Deep Tissue Pressure Injury - Purple or maroon localized area of discolored intact skin or blood-filled blister due to damage of underlying soft tissue from pressure and/or shear.  Wound Description (Comments):   Present on Admission: Yes   I have seen and examined this patient myself. I have spent 33 minutes in his evaluation and care.  DVT Prophylaxis: Lovenox CODE STATUS: DNR Family Communication: I have discussed the patient with his daughter in detail. All questions answered to the best of my ability. Disposition:  Status is: Inpatient  Remains inpatient appropriate because:Inpatient level of care appropriate due to severity of illness  Dispo: The patient is from: Home              Anticipated d/c is to: SNF              Anticipated d/c date  is: 2 days              Patient currently is not medically stable to d/c. [place], DO [facility] Direct contact: see [email or web address]  7PM-7AM contact night coverage as above 10/25/2020, 2:10 PM  LOS: 5 days

## 2020-10-25 NOTE — Progress Notes (Signed)
RE: Gary [name]  Date of Birth: 11/23/ 1952  Date: 12/19/ 2021  To Whom It May Concern:  Please be advised that the above-named patient will require a short-term nursing home stay - anticipated 30 days or less for rehabilitation and strengthening. The plan is for return home. 

## 2020-10-25 NOTE — NC FL2 (Signed)
[place] MEDICAID FL2 LEVEL OF CARE SCREENING TOOL     IDENTIFICATION  Patient Name: Gary [name] Birthdate: 1951-01-10 Sex: male Admission Date (Current Location): 10/18/2020  [name] and [place] Number:  Producer, television/film/video and Address:  [facility], [address] [place], [place] [address]      Provider Number: [id number]  Attending Physician Name and Address:  Fran Lowes, DO  Relative Name and Phone Number:  Elita Quick, daughter [phone]    Current Level of Care: Hospital Recommended Level of Care: Skilled Nursing Facility Prior Approval Number:    Date Approved/Denied:   PASRR Number:    Discharge Plan: SNF    Current Diagnoses: Patient Active Problem List   Diagnosis Date Noted  . Acute CVA (cerebrovascular accident) (HCC) 10/18/2020  . Encephalopathy 10/18/2020  . Tobacco abuse 10/16/2018  . Chronic systolic heart failure (HCC) 03/19/2018  . Paroxysmal atrial fibrillation (HCC) 03/19/2018  . Chest pain 01/04/2017  . ICD lead fracture- revision 02/21/14 02/22/2014  . Pericardial effusion- small post lead revision 02/20/2014  . S/P ICD March 2015 02/19/2014  . Cardiomyopathy, ischemic- EF 30% 01/13/2014  . Ventricular tachycardia (HCC) 01/13/2014  . CAD (coronary artery disease) 01/13/2014  . Essential hypertension 01/13/2014  . Hypercholesterolemia 10/16/2013  . Protein-calorie malnutrition, severe (HCC) 10/08/2013  . History of non-ST elevation myocardial infarction (NSTEMI) 10/05/2013    Orientation RESPIRATION BLADDER Height & Weight     Self  Normal Incontinent,External catheter Weight: 151 lb 3.8 oz (68.6 kg) Height:  5\' 6"  (167.6 cm)  BEHAVIORAL SYMPTOMS/MOOD NEUROLOGICAL BOWEL NUTRITION STATUS      Continent Diet (Please see DC Summary)  AMBULATORY STATUS COMMUNICATION OF NEEDS Skin   Extensive Assist Verbally PU Stage and Appropriate Care (Deep tissue injury on hip and knee; non-pressure wound on shoulder and thigh  all with foam dressings)                       Personal Care Assistance Level of Assistance  Bathing,Feeding,Dressing Bathing Assistance: Maximum assistance Feeding assistance: Limited assistance Dressing Assistance: Maximum assistance     Functional Limitations Info  Sight,Hearing,Speech Sight Info: Adequate Hearing Info: Adequate Speech Info: Impaired    SPECIAL CARE FACTORS FREQUENCY  PT (By licensed PT),OT (By licensed OT),Speech therapy     PT Frequency: 5x/week OT Frequency: 5x/week     Speech Therapy Frequency: 5x week      Contractures Contractures Info: Not present    Additional Factors Info  Allergies,Code Status,Psychotropic Code Status Info: DNR Allergies Info: Codeine, Morphine And Related, Statins Psychotropic Info: Lexapro         Current Medications (10/25/2020):  This is the current hospital active medication list Current Facility-Administered Medications  Medication Dose Route Frequency Provider Last Rate Last Admin  . (feeding supplement) PROSource Plus liquid 30 mL  30 mL Oral BID BM 10/27/2020, MD   30 mL at 10/24/20 1500  . 0.9 %  sodium chloride infusion   Intravenous Continuous 10/26/20, MD 50 mL/hr at 10/22/20 2334 New Bag at 10/22/20 2334  . acetaminophen (TYLENOL) 160 MG/5ML solution 650 mg  650 mg Oral Q4H PRN Pham, Minh Q, RPH-CPP       Or  . acetaminophen (TYLENOL) tablet 650 mg  650 mg Oral Q4H PRN Pham, Minh Q, RPH-CPP   650 mg at 10/24/20 0845   Or  . acetaminophen (TYLENOL) suppository 650 mg  650 mg Rectal Q4H  PRN Ulyses Southward Q, RPH-CPP      . ALPRAZolam (XANAX) tablet 0.5 mg  0.5 mg Oral QHS PRN Swayze, Ava, DO      . alum & mag hydroxide-simeth (MAALOX/MYLANTA) 200-200-20 MG/5ML suspension 30 mL  30 mL Oral Q4H PRN Arsenio Loader, MD   30 mL at 10/22/20 0616  . amLODipine (NORVASC) tablet 5 mg  5 mg Oral Daily Arsenio Loader, MD   5 mg at 10/24/20 0842  . aspirin EC tablet 325 mg  325 mg  Oral Daily Pham, Minh Q, RPH-CPP   325 mg at 10/24/20 0842  . chlorhexidine (PERIDEX) 0.12 % solution 15 mL  15 mL Mouth Rinse BID Theotis Barrio, MD   15 mL at 10/24/20 2222  . Chlorhexidine Gluconate Cloth 2 % PADS 6 each  6 each Topical Daily Theotis Barrio, MD   6 each at 10/24/20 1005  . enoxaparin (LOVENOX) injection 40 mg  40 mg Subcutaneous Q24H Theotis Barrio, MD   40 mg at 10/24/20 0843  . escitalopram (LEXAPRO) tablet 5 mg  5 mg Oral Daily Swayze, Ava, DO   5 mg at 10/24/20 0842  . feeding supplement (ENSURE ENLIVE / ENSURE PLUS) liquid 237 mL  237 mL Oral TID BM Swayze, Ava, DO   237 mL at 10/24/20 2000  . ipratropium-albuterol (DUONEB) 0.5-2.5 (3) MG/3ML nebulizer solution 3 mL  3 mL Nebulization Q4H PRN Swayze, Ava, DO      . MEDLINE mouth rinse  15 mL Mouth Rinse q12n4p Theotis Barrio, MD   15 mL at 10/24/20 1231  . multivitamin with minerals tablet 1 tablet  1 tablet Oral Daily Ulyses Southward Q, RPH-CPP   1 tablet at 10/24/20 0842  . senna (SENOKOT) tablet 8.6 mg  1 tablet Oral QODAY Swayze, Ava, DO   8.6 mg at 10/23/20 1742  . traMADol (ULTRAM) tablet 50 mg  50 mg Oral Q6H PRN Swayze, Ava, DO   50 mg at 10/24/20 1846     Discharge Medications: Please see discharge summary for a list of discharge medications.  Relevant Imaging Results:  Relevant Lab Results:   Additional Information SSN: [id number] [place]

## 2020-10-26 ENCOUNTER — Inpatient Hospital Stay (HOSPITAL_COMMUNITY): Payer: Medicare Other

## 2020-10-26 ENCOUNTER — Telehealth: Payer: Self-pay | Admitting: Cardiovascular Disease

## 2020-10-26 LAB — GLUCOSE, CAPILLARY
Glucose-Capillary: 102 mg/dL — ABNORMAL HIGH (ref 70–99)
Glucose-Capillary: 114 mg/dL — ABNORMAL HIGH (ref 70–99)
Glucose-Capillary: 119 mg/dL — ABNORMAL HIGH (ref 70–99)
Glucose-Capillary: 124 mg/dL — ABNORMAL HIGH (ref 70–99)
Glucose-Capillary: 135 mg/dL — ABNORMAL HIGH (ref 70–99)
Glucose-Capillary: 142 mg/dL — ABNORMAL HIGH (ref 70–99)

## 2020-10-26 LAB — CBC WITH DIFFERENTIAL/PLATELET
Abs Immature Granulocytes: 0.3 10*3/uL — ABNORMAL HIGH (ref 0.00–0.07)
Basophils Absolute: 0 10*3/uL (ref 0.0–0.1)
Basophils Relative: 0 %
Eosinophils Absolute: 0.2 10*3/uL (ref 0.0–0.5)
Eosinophils Relative: 2 %
HCT: 36.4 % — ABNORMAL LOW (ref 39.0–52.0)
Hemoglobin: 11.9 g/dL — ABNORMAL LOW (ref 13.0–17.0)
Immature Granulocytes: 3 %
Lymphocytes Relative: 10 %
Lymphs Abs: 1.1 10*3/uL (ref 0.7–4.0)
MCH: 30.8 pg (ref 26.0–34.0)
MCHC: 32.7 g/dL (ref 30.0–36.0)
MCV: 94.3 fL (ref 80.0–100.0)
Monocytes Absolute: 1.2 10*3/uL — ABNORMAL HIGH (ref 0.1–1.0)
Monocytes Relative: 11 %
Neutro Abs: 8.2 10*3/uL — ABNORMAL HIGH (ref 1.7–7.7)
Neutrophils Relative %: 74 %
Platelets: 238 10*3/uL (ref 150–400)
RBC: 3.86 MIL/uL — ABNORMAL LOW (ref 4.22–5.81)
RDW: 13.8 % (ref 11.5–15.5)
WBC: 11 10*3/uL — ABNORMAL HIGH (ref 4.0–10.5)
nRBC: 0 % (ref 0.0–0.2)

## 2020-10-26 LAB — BASIC METABOLIC PANEL
Anion gap: 8 (ref 5–15)
BUN: 19 mg/dL (ref 8–23)
CO2: 28 mmol/L (ref 22–32)
Calcium: 8.4 mg/dL — ABNORMAL LOW (ref 8.9–10.3)
Chloride: 100 mmol/L (ref 98–111)
Creatinine, Ser: 0.66 mg/dL (ref 0.61–1.24)
GFR, Estimated: >60 mL/min (ref >60)
Glucose, Bld: 113 mg/dL — ABNORMAL HIGH (ref 70–99)
Potassium: 3.7 mmol/L (ref 3.5–5.1)
Sodium: 136 mmol/L (ref 135–145)

## 2020-10-26 NOTE — Telephone Encounter (Signed)
[name] is calling to cancel Gary [name]'s upcoming appointments for this week due to him being currently admitted. She is wanting to know if Dr. Johney Frame or Dr. Excell Seltzer would be able to see him while he is there due to this. Please advise.

## 2020-10-26 NOTE — Progress Notes (Signed)
Calorie Count Note  48 hour calorie count ordered.  Diet: dysphagia 1 diet with thin liquids Supplements: Ensure Enlive po BID, each supplement provides 350 kcal and 20 grams of protein; Magic cup TID with meals, each supplement provides 290 kcal and 9 grams of protein; MVI with minerals daily; 30 ml Prosource Plus BID, each supplement provides 100 kcals and 15 grams protein  Limited meal completion data available.   10/23/20 Breakfast: 554 kcals, 30 grams protein Lunch: 691 kcals, 38 grams protein Dinner: 80 kcals, 2 grams Supplements: 2 Ensure Enlive supplements (700 kcals, 40 grams protein), 2 Prosource Plus supplements (200 kcals, 30 grams protein)  Total intake: 2225 kcal (> 100% of minimum estimated needs)  140 grams protein (> 100% of minimum estimated needs)  Nutrition Dx: Severe Malnutrition related to chronic illness as evidenced by severe muscle depletion,severe fat depletion; ongoing  Goal: Patient will meet greater than or equal to 90% of their needs; progressing   Intervention:   -D/c calorie count -Continue Ensure Enlive po BID, each supplement provides 350 kcal and 20 grams of protein -Continue Magic cup TID with meals, each supplement provides 290 kcal and 9 grams of protein -Continue 30 ml Prosource Plus BID, each supplement provides 100 kcals and 15 grams protein  Levada Schilling, RD, LDN, CDCES Registered Dietitian II Certified Diabetes Care and Education Specialist Please refer to AMION for RD and/or RD on-call/weekend/after hours pager

## 2020-10-26 NOTE — Progress Notes (Signed)
Physical Therapy Treatment Patient Details Name: Gary [name] MRN: [id number] DOB: [birth year] Today's Date: 10/26/2020    History of Present Illness The pt is a 69 yo male presenting after being found down at home. Upon work up, pt found to have "large hypodensity of right frontal and parietal lobes involving almost the entire right MCA territory." PMH includes: CAD with NSTEMI 2014 s/p balloon stent placement, HLD, HTN, cardiomyopathy, MR, pacemaker, and afib.    PT Comments    Pt agreeable to PT/OT today despite being lethargic from pain meds. Easily aroused. Verbalized back pain with mvmt. Pt required mod A for LE's off EOB and elevation of trunk into sitting. Pt worked on sit to stand with back of recliner and mirror placed to increase attention to L side and work on midline positioning. Pt with collapse of L hip and knee in standing and decreased ability to correct. Pt transferred to recliner with max A +2. Daughter present for session. Continues to recommend CIR at d/c. PT will continue to follow.    Follow Up Recommendations  CIR     Equipment Recommendations  Other (comment) (defer)    Recommendations for Other Services Rehab consult     Precautions / Restrictions Precautions Precautions: Fall Precaution Comments: Lft hemiplegia, neglect, and visual deficits. Patient could not read at baseline. Restrictions Weight Bearing Restrictions: No    Mobility  Bed Mobility Overal bed mobility: Needs Assistance Bed Mobility: Supine to Sit     Supine to sit: Mod assist;HOB elevated     General bed mobility comments: mod A to LE's as well as at trunk to elevate into sitting.  Transfers Overall transfer level: Needs assistance   Transfers: Sit to/from Stand;Stand Pivot Transfers Sit to Stand: +2 physical assistance;+2 safety/equipment;Max assist Stand pivot transfers: Max assist;+2 physical assistance;+2 safety/equipment       General transfer comment: stood wo recliner  back with mirror placed in front for increased attention to L side. Pt with collapse of L knee and hip in standing with decreased ability to self correct. Pt transferred bed to recliner with max A +2 for support, especially on L side  Ambulation/Gait                 Stairs             Wheelchair Mobility    Modified Rankin (Stroke Patients Only) Modified Rankin (Stroke Patients Only) Pre-Morbid Rankin Score: Slight disability Modified Rankin: Moderately severe disability     Balance Overall balance assessment: Needs assistance Sitting-balance support: Single extremity supported;Feet supported Sitting balance-Leahy Scale: Poor Sitting balance - Comments: min A to sit EOB with frequent replacement of L hand to lap so he could not push. L lean noted. Postural control: Left lateral lean Standing balance support: During functional activity;Bilateral upper extremity supported Standing balance-Leahy Scale: Poor Standing balance comment: max A +2 to maintain standing                            Cognition Arousal/Alertness: Lethargic;Suspect due to medications (easily awakened) Behavior During Therapy: Flat affect;Impulsive Overall Cognitive Status: Impaired/Different from baseline Area of Impairment: Attention;Memory;Following commands;Safety/judgement;Problem solving;Awareness                   Current Attention Level: Sustained Memory: Decreased short-term memory Following Commands: Follows one step commands with increased time;Follows one step commands inconsistently Safety/Judgement: Decreased awareness of safety;Decreased awareness of deficits Awareness: Intellectual Problem Solving:  Difficulty sequencing;Requires tactile cues;Requires verbal cues;Slow processing General Comments: pt with expressive difficulties and compensating with phrases that at first seem to not make sense but do to him. Poor spatial awareness noted in standing. Pt also  internally distracted.      Exercises General Exercises - Lower Extremity Ankle Circles/Pumps: Both;10 reps;AROM;Seated;Limitations Ankle Circles/Pumps Limitations: no noted mvmt L ankle Long Arc Quad: Left;Seated;AROM;10 reps Straight Leg Raises: AROM;Both;10 reps;Supine;Limitations Straight Leg Raises Limitations: pt able to tolerate resistance to RLE. With LLE, unable to maintain fully extended knee and lacks control with descent    General Comments General comments (skin integrity, edema, etc.): daughter present for session.      Pertinent Vitals/Pain Pain Assessment: Faces Faces Pain Scale: Hurts little more Pain Location: back Pain Descriptors / Indicators: Grimacing;Guarding Pain Intervention(s): Limited activity within patient's tolerance;Monitored during session;Premedicated before session    Home Living                      Prior Function            PT Goals (current goals can now be found in the care plan section) Acute Rehab PT Goals Patient Stated Goal: none stated this session PT Goal Formulation: With patient Time For Goal Achievement: 11/02/20 Potential to Achieve Goals: Good Progress towards PT goals: Progressing toward goals    Frequency    Min 4X/week      PT Plan Current plan remains appropriate    Co-evaluation PT/OT/SLP Co-Evaluation/Treatment: Yes Reason for Co-Treatment: Complexity of the patient's impairments (multi-system involvement);Necessary to address cognition/behavior during functional activity;For patient/therapist safety PT goals addressed during session: Mobility/safety with mobility;Balance;Strengthening/ROM        AM-PAC PT "6 Clicks" Mobility   Outcome Measure  Help needed turning from your back to your side while in a flat bed without using bedrails?: A Lot Help needed moving from lying on your back to sitting on the side of a flat bed without using bedrails?: A Lot Help needed moving to and from a bed to a  chair (including a wheelchair)?: A Lot Help needed standing up from a chair using your arms (e.g., wheelchair or bedside chair)?: A Lot Help needed to walk in hospital room?: Total Help needed climbing 3-5 steps with a railing? : Total 6 Click Score: 10    End of Session Equipment Utilized During Treatment: Gait belt Activity Tolerance: Patient tolerated treatment well;Patient limited by fatigue Patient left: in chair;with call bell/phone within reach;with family/visitor present;with chair alarm set Nurse Communication: Mobility status PT Visit Diagnosis: Unsteadiness on feet (R26.81);Other abnormalities of gait and mobility (R26.89);Hemiplegia and hemiparesis;Muscle weakness (generalized) (M62.81);Difficulty in walking, not elsewhere classified (R26.2);Other symptoms and signs involving the nervous system (R29.898) Hemiplegia - Right/Left: Left Hemiplegia - dominant/non-dominant: Non-dominant Hemiplegia - caused by: Cerebral infarction     Time: 1123-1202 PT Time Calculation (min) (ACUTE ONLY): 39 min  Charges:  $Therapeutic Activity: 8-22 mins                     Lyanne Co, PT  Acute Rehab Services  Pager [phone] Office [phone]    Lawana Chambers Jamelyn Bovard 10/26/2020, 2:57 PM

## 2020-10-26 NOTE — Progress Notes (Signed)
Occupational Therapy Treatment Patient Details Name: Gary [name] MRN: [id number] DOB: 06-20-1951 Today's Date: 10/26/2020    History of present illness The pt is a 69 yo male presenting after being found down at home. Upon work up, pt found to have "large hypodensity of right frontal and parietal lobes involving almost the entire right MCA territory." PMH includes: CAD with NSTEMI 2014 s/p balloon stent placement, HLD, HTN, cardiomyopathy, MR, pacemaker, and afib.   OT comments  Pt completed OOB to chair this session total +2 max (A) with LLE blocked due to buckling. Pt able to move LLE with sitting cues but in standing lacks proprioception to location. Pt pushing with R UE at times during session. Recommend CIR for d/c planning to d/c burden of care on family. Pt with visual deficits prior to admission so needs strong cues to help with midline.   Follow Up Recommendations  CIR    Equipment Recommendations  3 in 1 bedside commode;Wheelchair (measurements OT);Wheelchair cushion (measurements OT)    Recommendations for Other Services Rehab consult    Precautions / Restrictions Precautions Precautions: Fall Precaution Comments: Lft hemiplegia, neglect, and visual deficits. Patient could not read at baseline. Restrictions Weight Bearing Restrictions: No       Mobility Bed Mobility Overal bed mobility: Needs Assistance Bed Mobility: Supine to Sit     Supine to sit: Mod assist;HOB elevated     General bed mobility comments: mod A to LE's as well as at trunk to elevate into sitting.able to bridge buttock  Transfers Overall transfer level: Needs assistance   Transfers: Sit to/from Stand;Stand Pivot Transfers Sit to Stand: +2 physical assistance;+2 safety/equipment;Max assist Stand pivot transfers: Max assist;+2 physical assistance;+2 safety/equipment       General transfer comment: stood leaning anteriorly on recliner back with mirror placed in front for increased attention  to L side. Pt with collapse of L knee and hip in standing with decreased ability to self correct. Pt needs input at head to hold head at midline. daugher and pt both reporting previous cervical injury. Pt transferred bed to recliner with max A +2 for support, especially on L side    Balance Overall balance assessment: Needs assistance Sitting-balance support: Single extremity supported;Feet supported Sitting balance-Leahy Scale: Poor Sitting balance - Comments: min A to sit EOB with frequent replacement of L hand to lap so he could not push. L lean noted. Postural control: Left lateral lean Standing balance support: During functional activity;Bilateral upper extremity supported Standing balance-Leahy Scale: Poor Standing balance comment: max A +2 to maintain standing                           ADL either performed or assessed with clinical judgement   ADL Overall ADL's : Needs assistance/impaired Eating/Feeding: Moderate assistance;Sitting       Upper Body Bathing: Moderate assistance   Lower Body Bathing: Maximal assistance       Lower Body Dressing: +2 for physical assistance;Maximal assistance Lower Body Dressing Details (indicate cue type and reason): pt standing to pull up pants. pt with LLE buckle and lacks awareness. pt giving cue to pull up pants Toilet Transfer: +2 for physical assistance;Maximal assistance     Toileting - Clothing Manipulation Details (indicate cue type and reason): noted to have incontinence due to poor fit of condom/ purewick placement. Pt with new purewick placed and mesh underwear with pad in chair in normal pants. pt with purewick thread through pant  front hole       General ADL Comments: pt agreeable to OOB and reports feels better. pt demontrates ability to bridge buttock on command in bed with blocking of L LE     Vision       Perception     Praxis      Cognition Arousal/Alertness: Lethargic;Suspect due to medications (easily  awakened) Behavior During Therapy: Flat affect;Impulsive Overall Cognitive Status: Impaired/Different from baseline Area of Impairment: Attention;Memory;Following commands;Safety/judgement;Problem solving;Awareness                   Current Attention Level: Sustained Memory: Decreased short-term memory Following Commands: Follows one step commands with increased time;Follows one step commands inconsistently Safety/Judgement: Decreased awareness of safety;Decreased awareness of deficits Awareness: Intellectual Problem Solving: Difficulty sequencing;Requires tactile cues;Requires verbal cues;Slow processing General Comments: pt with expressive difficulties and compensating with phrases that at first seem to not make sense but do to him. Poor spatial awareness noted in standing. Pt also internally distracted.Pt cusses as a baseline form of speech and just his use dialect speech. Pt aware of location and reason for admission and that L side is affect. pt lacks awareness to how to correct L side deficits noted with pushing and buckle of L leg        Exercises Exercises: Other exercises General Exercises - Lower Extremity Ankle Circles/Pumps: Both;10 reps;AROM;Seated;Limitations Ankle Circles/Pumps Limitations: no noted mvmt L ankle Long Arc Quad: Left;Seated;AROM;10 reps Straight Leg Raises: AROM;Both;10 reps;Supine;Limitations Straight Leg Raises Limitations: pt able to tolerate resistance to RLE. With LLE, unable to maintain fully extended knee and lacks control with descent Other Exercises Other Exercises: AROM all digits MCP wrist PROM for edema managemen   Shoulder Instructions       General Comments daughter present ( 3 daughters total this was the oldest daughter) IV leaking and RN in room to address at end of session    Pertinent Vitals/ Pain       Pain Assessment: Faces Faces Pain Scale: Hurts little more Pain Location: back Pain Descriptors / Indicators:  Grimacing;Guarding Pain Intervention(s): Monitored during session;Premedicated before session;Repositioned (1 hr after pain medication)  Home Living                                          Prior Functioning/Environment              Frequency  Min 2X/week        Progress Toward Goals  OT Goals(current goals can now be found in the care plan section)  Progress towards OT goals: Progressing toward goals  Acute Rehab OT Goals Patient Stated Goal: to get out of the river ( pt noted to have incontience due to leaking purewick) pt was trying to explain wetness of bed OT Goal Formulation: With patient Time For Goal Achievement: 11/02/20 Potential to Achieve Goals: Good ADL Goals Pt Will Perform Grooming: with min assist;sitting Pt Will Perform Upper Body Dressing: with min assist;sitting Pt Will Perform Lower Body Dressing: with mod assist;sit to/from stand Pt Will Transfer to Toilet: with mod assist;bedside commode;stand pivot transfer Pt Will Perform Toileting - Clothing Manipulation and hygiene: with min assist;sitting/lateral leans;sit to/from stand Additional ADL Goal #1: Pt will locate ADL items at midline 50% of time with Min cues  Plan Discharge plan remains appropriate    Co-evaluation    PT/OT/SLP Co-Evaluation/Treatment: Yes Reason for  Co-Treatment: Complexity of the patient's impairments (multi-system involvement);For patient/therapist safety;To address functional/ADL transfers PT goals addressed during session: Mobility/safety with mobility;Balance;Strengthening/ROM OT goals addressed during session: ADL's and self-care;Proper use of Adaptive equipment and DME;Strengthening/ROM      AM-PAC OT "6 Clicks" Daily Activity     Outcome Measure   Help from another person eating meals?: A Lot Help from another person taking care of personal grooming?: A Lot Help from another person toileting, which includes using toliet, bedpan, or urinal?: A  Lot Help from another person bathing (including washing, rinsing, drying)?: A Lot Help from another person to put on and taking off regular upper body clothing?: A Lot Help from another person to put on and taking off regular lower body clothing?: A Lot 6 Click Score: 12    End of Session Equipment Utilized During Treatment: Gait belt  OT Visit Diagnosis: Unsteadiness on feet (R26.81);Muscle weakness (generalized) (M62.81);Other abnormalities of gait and mobility (R26.89);Pain Pain - Right/Left: Left Pain - part of body: Shoulder   Activity Tolerance Patient tolerated treatment well   Patient Left in chair;with call bell/phone within reach;with chair alarm set;with nursing/sitter in room;with family/visitor present   Nurse Communication Mobility status;Precautions        Time: 1121-1200 OT Time Calculation (min): 39 min  Charges: OT General Charges $OT Visit: 1 Visit OT Treatments $Self Care/Home Management : 23-37 mins   Brynn, OTR/L  Acute Rehabilitation Services Pager: [phone] Office: [phone] .    Mateo Flow 10/26/2020, 3:17 PM

## 2020-10-26 NOTE — Progress Notes (Signed)
No ICM remote transmission received for 10/26/2020 due to hospitalization and next ICM transmission scheduled for 11/16/2020.

## 2020-10-26 NOTE — Plan of Care (Signed)
  Problem: Education: Goal: Knowledge of General Education information will improve Description: Including pain rating scale, medication(s)/side effects and non-pharmacologic comfort measures Outcome: Progressing   Problem: Health Behavior/Discharge Planning: Goal: Ability to manage health-related needs will improve Outcome: Progressing   Problem: Clinical Measurements: Goal: Ability to maintain clinical measurements within normal limits will improve Outcome: Progressing Goal: Will remain free from infection Outcome: Progressing Goal: Diagnostic test results will improve Outcome: Progressing Goal: Respiratory complications will improve Outcome: Progressing Goal: Cardiovascular complication will be avoided Outcome: Progressing   Problem: Activity: Goal: Risk for activity intolerance will decrease Outcome: Progressing   Problem: Nutrition: Goal: Adequate nutrition will be maintained Outcome: Progressing   Problem: Coping: Goal: Level of anxiety will decrease Outcome: Progressing   Problem: Elimination: Goal: Will not experience complications related to bowel motility Outcome: Progressing Goal: Will not experience complications related to urinary retention Outcome: Progressing   Problem: Pain Managment: Goal: General experience of comfort will improve Outcome: Progressing   Problem: Safety: Goal: Ability to remain free from injury will improve Outcome: Progressing   Problem: Skin Integrity: Goal: Risk for impaired skin integrity will decrease Outcome: Progressing   Problem: Education: Goal: Knowledge of secondary prevention will improve Outcome: Progressing Goal: Knowledge of patient specific risk factors addressed and post discharge goals established will improve Outcome: Progressing Goal: Individualized Educational Video(s) Outcome: Progressing   Problem: Intracerebral Hemorrhage Tissue Perfusion: Goal: Complications of Intracerebral Hemorrhage will be  minimized Outcome: Progressing   Problem: Ischemic Stroke/TIA Tissue Perfusion: Goal: Complications of ischemic stroke/TIA will be minimized Outcome: Progressing   Problem: Spontaneous Subarachnoid Hemorrhage Tissue Perfusion: Goal: Complications of Spontaneous Subarachnoid Hemorrhage will be minimized Outcome: Progressing   Problem: Safety: Goal: Non-violent Restraint(s) Outcome: Progressing

## 2020-10-26 NOTE — Progress Notes (Signed)
PROGRESS NOTE  CLAYBORNE DIVIS TDV:[id number] DOB: 11-20-1950 DOA: 10/18/2020 PCP: Jason Coop, FNP  Brief History   Patient is 69 year old male with past medical history of A. fib-not on anticoagulation, hypertension, hyperlipidemia, ischemic cardiomyopathy, coronary artery disease presents to emergency department for unresponsiveness.   Patient was found down by family and admitted with code stroke. Last known normal on December 8. Initially presented to any pain and found to have right MCA territory infarction. Seen by neurology and thought to be outside window for reperfusion. Patient admitted to ICU for hypertonic saline administration. Did have some shortness of breath and was placed on 15 L of oxygen.  The patient's sodium improved, and his oxygen requirements improved. The patient was transferred to the floor to the care of [facility] on 10/21/2020.  On 10/22/2020 the patient passed a swallow eval and was started on a dysphagia 1 diet. NGT was removed. Calorie count started.   Pt with decreased oxygen saturations today. CXR checked and demonstrated improved appearance of infiltrates.  Consultants  . Neurology . PCCM  Procedures  . None  Antibiotics   Anti-infectives (From admission, onward)   None     Subjective  The patient is sitting up in bed. No new complaints.  Objective   Vitals:  Vitals:   10/26/20 1112 10/26/20 1621  BP: 132/90 140/71  Pulse: 79 76  Resp: 20 18  Temp: 98.5 F (36.9 C) 97.8 F (36.6 C)  SpO2: 98% 99%   Exam:  Constitutional:  . The patient is awake, alert and sitting up in bed. He appears acutely and chronically  No acute distress. Respiratory:  . No increased work of breathing. . No wheezes, rales, or rhonchi . No tactile fremitus Cardiovascular:  . Regular rate and rhythm . No murmurs, ectopy, or gallups. . No lateral PMI. No thrills. Abdomen:  . Abdomen is soft, non-tender, non-distended . No  hernias, masses, or organomegaly . Normoactive bowel sounds.  Musculoskeletal:  . No cyanosis, clubbing, or edema Skin:  . No rashes, lesions, ulcers . palpation of skin: no induration or nodules Neurologic:  . CN 2-12 intact . Sensation all 4 extremities intact Psychiatric:  . Mood is depressed, affect is flat. . Pt does not appear able to appreciate his medical situation or to make decisions regarding his health or well being.  I have personally reviewed the following:   Today's Data  . Vitals, BMP, CBC, Glucoses  Micro Data  . Blood cultures x 2: No growth  Imaging  . CT head without contrast . CTA head and neck . CXR  Cardiology Data  . Echocardiogram . EKG  Scheduled Meds: . (feeding supplement) PROSource Plus  30 mL Oral BID BM  . amLODipine  5 mg Oral Daily  . aspirin EC  325 mg Oral Daily  . chlorhexidine  15 mL Mouth Rinse BID  . enoxaparin (LOVENOX) injection  40 mg Subcutaneous Q24H  . escitalopram  5 mg Oral Daily  . feeding supplement  237 mL Oral TID BM  . mouth rinse  15 mL Mouth Rinse q12n4p  . multivitamin with minerals  1 tablet Oral Daily  . senna  1 tablet Oral QODAY   Continuous Infusions: . sodium chloride 50 mL/hr at 10/26/20 0242    Principal Problem:   Acute CVA (cerebrovascular accident) [facility]) Active Problems:   Cardiomyopathy, ischemic- EF 30%   CAD (coronary artery disease)   Essential hypertension   S/P ICD March 2015  Tobacco abuse   Encephalopathy   LOS: 8 days   A & P   Right MCA territory infarct: Secondary to embolic source for unknown atrial fibrillation-not on anticoagulation.CT head shows large right frontal and parietal lobes hypodensity with minimal mass-effect. Small vessel disease. CTA head and neck shows right MCA infarct. Right ICA soft and calcified plaque with 50% bulb stenosis. Right M2 superior stenosis. Patient has pacemaker which is not compatible with MRI. Transthoracic echo shows ejection fraction of  50 to 55% with grade 1 diastolic dysfunction., No intracardiac source of embolism detected.A1c: 5.9%, LDL: 190, UDS: Negative. Per neurology recommendations: continue aspirin. PT/OT SLP Eval and treat. I  appreciate neurology assistance.  Acute hypoxemic respiratory failure: Chest x-ray shows atelectasis left lower lobe. The patient is currently saturating at 99% on 2L by [place].  Dysphagia: The patient's NGT was removed after he was able to be advanced to a Dysphagia 1 diet. Calorie count initiated, and nutrition consulted.   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   Severe protein calorie malnutrition: Pt is on a calorie count and nutrition supplementation has been ordered.   Paroxysmal A. fib: Not on anticoagulation at home. Per note patient refused anticoagulation in the past. Neurology recommended anticoagulation in 10 to 14 days post stroke to avoid hemorrhagic transformation.Monitor closely on telemetry  Hypertension: Stable. Continue to hold home BP meds for now. Monitor blood pressure closely  Hyperlipidemia: LDL: 190. Patient had side effect of myalgia with the statin. We will continue his Zetia at the time of discharge.  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 Echo from today shows ejection fraction of 50 to 55% with grade 1 diastolic dysfunction. Patient appears euvolemic on exam. Continue strict I&O's and daily weights.  Hypernatremia: Resolving.Monitor electrolytes.  Hypophosphatemia:Replenished. Repeat phosphorus level tomorrow AM.  Leukocytosis: Likely reactive/stress induced. Monitor.  Pressure injuries: POA  Pressure Injury 10/19/20 Hip Left Deep Tissue Pressure Injury - Purple or maroon localized area of discolored intact skin or blood-filled blister due to damage of underlying soft tissue from pressure and/or shear. (Active)  10/19/20 1419  Location: Hip  Location Orientation: Left  Staging:  Deep Tissue Pressure Injury - Purple or maroon localized area of discolored intact skin or blood-filled blister due to damage of underlying soft tissue from pressure and/or shear.  Wound Description (Comments):   Present on Admission: Yes     Pressure Injury 10/19/20 Knee Right;Medial Deep Tissue Pressure Injury - Purple or maroon localized area of discolored intact skin or blood-filled blister due to damage of underlying soft tissue from pressure and/or shear. (Active)  10/19/20 1419  Location: Knee  Location Orientation: Right;Medial  Staging: Deep Tissue Pressure Injury - Purple or maroon localized area of discolored intact skin or blood-filled blister due to damage of underlying soft tissue from pressure and/or shear.  Wound Description (Comments):   Present on Admission: Yes   I have seen and examined this patient myself. I have spent 33 minutes in his evaluation and care.  DVT Prophylaxis: Lovenox CODE STATUS: DNR Family Communication: I have discussed the patient with his daughter in detail. All questions answered to the best of my ability. Disposition:  Status is: Inpatient  Remains inpatient appropriate because:Inpatient level of care appropriate due to severity of illness  Dispo: The patient is from: Home              Anticipated d/c is to: SNF  Anticipated d/c date is: 2 days              Patient currently is not medically stable to d/c. Jazmine Heckman, DO [facility] Direct contact: see [email or web address]  7PM-7AM contact night coverage as above 10/26/2020, 4:31 PM  LOS: 5 days

## 2020-10-26 NOTE — TOC Progression Note (Signed)
Transition of Care [facility]) - Progression Note    Patient Details  Name: KEIGHAN AMEZCUA MRN: [id number] Date of Birth: [birth year]  Transition of Care [facility]) CM/SW Contact  Carley Hammed, [place] Phone Number: 10/26/2020, 4:02 PM  Clinical Narrative:     Bed offers were given to [facility] at bedside. She is discussing them with family. Please follow up with her for choice. SW will continue to follow.  Expected Discharge Plan: Skilled Nursing Facility Barriers to Discharge: Continued Medical Work up,Insurance Authorization,SNF Pending bed offer  Expected Discharge Plan and Services Expected Discharge Plan: Skilled Nursing Facility     Post Acute Care Choice: Skilled Nursing Facility Living arrangements for the past 2 months: Single Family Home                                       Social Determinants of Health (SDOH) Interventions    Readmission Risk Interventions No flowsheet data found.

## 2020-10-26 NOTE — Telephone Encounter (Signed)
Dr. Excell Seltzer made aware of hospitalization.

## 2020-10-27 LAB — GLUCOSE, CAPILLARY
Glucose-Capillary: 102 mg/dL — ABNORMAL HIGH (ref 70–99)
Glucose-Capillary: 103 mg/dL — ABNORMAL HIGH (ref 70–99)
Glucose-Capillary: 108 mg/dL — ABNORMAL HIGH (ref 70–99)
Glucose-Capillary: 122 mg/dL — ABNORMAL HIGH (ref 70–99)
Glucose-Capillary: 125 mg/dL — ABNORMAL HIGH (ref 70–99)
Glucose-Capillary: 130 mg/dL — ABNORMAL HIGH (ref 70–99)

## 2020-10-27 NOTE — Progress Notes (Signed)
PROGRESS NOTE  Gary [name]:[id number] DOB: 10-05-1951 DOA: 10/18/2020 PCP: Jason Coop, FNP  Brief History   Patient is 69 year old male with past medical history of A. fib-not on anticoagulation, hypertension, hyperlipidemia, ischemic cardiomyopathy, coronary artery disease presents to emergency department for unresponsiveness.   Patient was found down by family and admitted with code stroke. Last known normal on December 8. Initially presented to any pain and found to have right MCA territory infarction. Seen by neurology and thought to be outside window for reperfusion. Patient admitted to ICU for hypertonic saline administration. Did have some shortness of breath and was placed on 15 L of oxygen.  The patient's sodium improved, and his oxygen requirements improved. The patient was transferred to the floor to the care of [facility] on 10/21/2020.  On 10/22/2020 the patient passed a swallow eval and was started on a dysphagia 1 diet. NGT was removed. Calorie count started.   Pt with decreased oxygen saturations today. CXR checked and demonstrated improved appearance of infiltrates.  Consultants  . Neurology . PCCM  Procedures  . None  Antibiotics   Anti-infectives (From admission, onward)   None     Subjective  The patient is sleeping soundly in bed. He is briefly rousable, then goes back to sleep. Daughter at bedside states that he did not sleep well last night.  Objective   Vitals:  Vitals:   10/27/20 0801 10/27/20 1607  BP: 135/79 (!) 116/59  Pulse: 82 80  Resp: 17 16  Temp: 98.4 F (36.9 C) 98.7 F (37.1 C)  SpO2: 98% 98%   Exam:  Constitutional:  . The patient is awake, alert and sitting up in bed. He appears acutely and chronically  No acute distress. Respiratory:  . No increased work of breathing. . No wheezes, rales, or rhonchi . No tactile fremitus Cardiovascular:  . Regular rate and rhythm . No murmurs, ectopy, or  gallups. . No lateral PMI. No thrills. Abdomen:  . Abdomen is soft, non-tender, non-distended . No hernias, masses, or organomegaly . Normoactive bowel sounds.  Musculoskeletal:  . No cyanosis, clubbing, or edema Skin:  . No rashes, lesions, ulcers . palpation of skin: no induration or nodules Neurologic:  . CN 2-12 intact . Sensation all 4 extremities intact Psychiatric:  . Mood is depressed, affect is flat. . Pt does not appear able to appreciate his medical situation or to make decisions regarding his health or well being.  I have personally reviewed the following:   Today's Data  . Vitals, BMP, CBC, Glucoses  Micro Data  . Blood cultures x 2: No growth  Imaging  . CT head without contrast . CTA head and neck . CXR  Cardiology Data  . Echocardiogram . EKG  Scheduled Meds: . (feeding supplement) PROSource Plus  30 mL Oral BID BM  . amLODipine  5 mg Oral Daily  . aspirin EC  325 mg Oral Daily  . chlorhexidine  15 mL Mouth Rinse BID  . enoxaparin (LOVENOX) injection  40 mg Subcutaneous Q24H  . escitalopram  5 mg Oral Daily  . feeding supplement  237 mL Oral TID BM  . mouth rinse  15 mL Mouth Rinse q12n4p  . multivitamin with minerals  1 tablet Oral Daily  . senna  1 tablet Oral QODAY   Continuous Infusions: . sodium chloride 50 mL/hr at 10/26/20 3614    Principal Problem:   Acute CVA (cerebrovascular accident) [facility]) Active Problems:   Cardiomyopathy, ischemic- EF  30%   CAD (coronary artery disease)   Essential hypertension   S/P ICD March 2015   Tobacco abuse   Encephalopathy   LOS: 9 days   A & P   Right MCA territory infarct: Secondary to embolic source for unknown atrial fibrillation-not on anticoagulation.CT head shows large right frontal and parietal lobes hypodensity with minimal mass-effect. Small vessel disease. CTA head and neck shows right MCA infarct. Right ICA soft and calcified plaque with 50% bulb stenosis. Right M2 superior stenosis.  Patient has pacemaker which is not compatible with MRI. Transthoracic echo shows ejection fraction of 50 to 55% with grade 1 diastolic dysfunction., No intracardiac source of embolism detected.A1c: 5.9%, LDL: 190, UDS: Negative. Per neurology recommendations: continue aspirin. PT/OT SLP Eval and treat. I  appreciate neurology assistance. The patient has been evaluted by PT/OT. They have recommended SNF placement.  Acute hypoxemic respiratory failure: Chest x-ray shows atelectasis left lower lobe. The patient is currently saturating at 99% on 2L by [place].  Dysphagia: The patient's NGT was removed after he was able to be advanced to a Dysphagia 1 diet. Calorie count initiated, and nutrition consulted.  Severe protein calorie malnutrition: Pt is on a calorie count and nutrition supplementation has been ordered.   Paroxysmal A. fib: Not on anticoagulation at home. Per note patient refused anticoagulation in the past. Neurology recommended anticoagulation in 10 to 14 days post stroke to avoid hemorrhagic transformation.Monitor closely on telemetry  Hypertension: Stable. Continue to hold home BP meds for now. Monitor blood pressure closely  Hyperlipidemia: LDL: 190. Patient had side effect of myalgia with the statin. We will continue his Zetia at the time of discharge.  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 Echo from today shows ejection fraction of 50 to 55% with grade 1 diastolic dysfunction. Patient appears euvolemic on exam. Continue strict I&O's and daily weights.  Hypernatremia: Resolving.Monitor electrolytes.  Hypophosphatemia:Replenished. Repeat phosphorus level tomorrow AM.  Leukocytosis: Likely reactive/stress induced. Monitor.  Pressure injuries: POA  Pressure Injury 10/19/20 Hip Left Deep Tissue Pressure Injury - Purple or maroon localized area of discolored intact skin or blood-filled blister due to damage of  underlying soft tissue from pressure and/or shear. (Active)  10/19/20 1419  Location: Hip  Location Orientation: Left  Staging: Deep Tissue Pressure Injury - Purple or maroon localized area of discolored intact skin or blood-filled blister due to damage of underlying soft tissue from pressure and/or shear.  Wound Description (Comments):   Present on Admission: Yes     Pressure Injury 10/19/20 Knee Right;Medial Deep Tissue Pressure Injury - Purple or maroon localized area of discolored intact skin or blood-filled blister due to damage of underlying soft tissue from pressure and/or shear. (Active)  10/19/20 1419  Location: Knee  Location Orientation: Right;Medial  Staging: Deep Tissue Pressure Injury - Purple or maroon localized area of discolored intact skin or blood-filled blister due to damage of underlying soft tissue from pressure and/or shear.  Wound Description (Comments):   Present on Admission: Yes   I have seen and examined this patient myself. I have spent 33 minutes in his evaluation and care.  DVT Prophylaxis: Lovenox CODE STATUS: DNR Family Communication: I have discussed the patient with his daughter in detail. All questions answered to the best of my ability. Disposition:  Status is: Inpatient  Remains inpatient appropriate because:Inpatient level of care appropriate due to severity of illness  Dispo: The patient is from: Home  Anticipated d/c is to: SNF              Anticipated d/c date is: 2 days              Patient currently is not medically stable to d/c. Sahirah Rudell, DO [facility] Direct contact: see [email or web address]  7PM-7AM contact night coverage as above 10/27/2020, 5:18 PM  LOS: 5 days

## 2020-10-27 NOTE — Progress Notes (Signed)
Physical Therapy Treatment Patient Details Name: Gary [name] MRN: [id number] DOB: 11-13-1950 Today's Date: 10/27/2020    History of Present Illness The pt is a 69 yo male presenting after being found down at home. Upon work up, pt found to have "large hypodensity of right frontal and parietal lobes involving almost the entire right MCA territory." PMH includes: CAD with NSTEMI 2014 s/p balloon stent placement, HLD, HTN, cardiomyopathy, MR, pacemaker, and afib.    PT Comments    Pt received in bed with daughter present. Pt verbalizes that he does not like spending so much time in bed. Pt worked on sit<>stand within stedy with mod A +2. Tolerated standing for 15 sec bouts before needing to sit back to stedy due to generalized discomfort. Attempted to have pt each lunch in modified seated position in stedy but pt wanted to sit all the way down in recliner and spit food out.  Once in recliner, encouraged pt's daughter to have him self feed as much as possible. PT will continue to follow.   Follow Up Recommendations  SNF;Supervision/Assistance - 24 hour     Equipment Recommendations  Other (comment) (defer)    Recommendations for Other Services Rehab consult     Precautions / Restrictions Precautions Precautions: Fall Precaution Comments: Left hemiplegia, neglect, and visual deficits. Patient could not read at baseline. Restrictions Weight Bearing Restrictions: No    Mobility  Bed Mobility Overal bed mobility: Needs Assistance Bed Mobility: Supine to Sit     Supine to sit: Mod assist     General bed mobility comments: mod A from flat bed. Pt able to slide RLE off bed, min A to LLE. Mod A at trunk  Transfers Overall transfer level: Needs assistance Equipment used: Ambulation equipment used Transfers: Sit to/from [employer] Transfers Sit to Stand: +2 physical assistance;+2 safety/equipment;Max assist Stand pivot transfers: Max assist;+2 physical assistance;+2  safety/equipment       General transfer comment: pt stood within stedy with mod support given to L side, min A to R for safety. Pt maintained standing only 15 secs before wanting to sit. Tolerated sitting on stedy x10 mins with attempt to eat lunch though pt wanting to sit all the way down in recliner and spitting lunch out.  Ambulation/Gait                 Stairs             Wheelchair Mobility    Modified Rankin (Stroke Patients Only) Modified Rankin (Stroke Patients Only) Pre-Morbid Rankin Score: Slight disability Modified Rankin: Moderately severe disability     Balance Overall balance assessment: Needs assistance Sitting-balance support: Single extremity supported;Feet supported Sitting balance-Leahy Scale: Poor Sitting balance - Comments: min A to sit EOB with frequent replacement of R hand to lap so he could not push. L lean noted. Postural control: Left lateral lean Standing balance support: During functional activity;Bilateral upper extremity supported Standing balance-Leahy Scale: Poor Standing balance comment: max A +2 to maintain standing                            Cognition Arousal/Alertness: Awake/alert Behavior During Therapy: Flat affect Overall Cognitive Status: Impaired/Different from baseline Area of Impairment: Attention;Memory;Following commands;Safety/judgement;Problem solving;Awareness                   Current Attention Level: Sustained Memory: Decreased short-term memory Following Commands: Follows one step commands with increased time;Follows one step  commands inconsistently Safety/Judgement: Decreased awareness of safety;Decreased awareness of deficits Awareness: Intellectual Problem Solving: Difficulty sequencing;Requires tactile cues;Requires verbal cues;Slow processing General Comments: pt less verbal during session today but does relay that he does not like lying around in bed. Daughter present and agrees that  pt rarely sat still PTA      Exercises      General Comments General comments (skin integrity, edema, etc.): SpO2 85% on RA, 2L O2 replaced and O2 up to 92%. encouraged pt's daughter to have pt self feed as much as possible      Pertinent Vitals/Pain Pain Assessment: Faces Faces Pain Scale: Hurts little more Pain Location: back, neck, LUE Pain Descriptors / Indicators: Grimacing;Guarding Pain Intervention(s): Limited activity within patient's tolerance;Monitored during session;Repositioned    Home Living                      Prior Function            PT Goals (current goals can now be found in the care plan section) Acute Rehab PT Goals Patient Stated Goal: get out of bed PT Goal Formulation: With patient Time For Goal Achievement: 11/02/20 Potential to Achieve Goals: Good Progress towards PT goals: Progressing toward goals    Frequency    Min 3X/week      PT Plan Discharge plan needs to be updated;Frequency needs to be updated    Co-evaluation              AM-PAC PT "6 Clicks" Mobility   Outcome Measure  Help needed turning from your back to your side while in a flat bed without using bedrails?: A Lot Help needed moving from lying on your back to sitting on the side of a flat bed without using bedrails?: A Lot Help needed moving to and from a bed to a chair (including a wheelchair)?: A Lot Help needed standing up from a chair using your arms (e.g., wheelchair or bedside chair)?: A Lot Help needed to walk in hospital room?: Total Help needed climbing 3-5 steps with a railing? : Total 6 Click Score: 10    End of Session Equipment Utilized During Treatment: Gait belt Activity Tolerance: Patient tolerated treatment well;Patient limited by fatigue Patient left: in chair;with call bell/phone within reach;with family/visitor present;with chair alarm set Nurse Communication: Mobility status PT Visit Diagnosis: Unsteadiness on feet (R26.81);Other  abnormalities of gait and mobility (R26.89);Hemiplegia and hemiparesis;Muscle weakness (generalized) (M62.81);Difficulty in walking, not elsewhere classified (R26.2);Other symptoms and signs involving the nervous system (R29.898) Hemiplegia - Right/Left: Left Hemiplegia - dominant/non-dominant: Non-dominant Hemiplegia - caused by: Cerebral infarction     Time: 0932-3557 PT Time Calculation (min) (ACUTE ONLY): 20 min  Charges:  $Therapeutic Activity: 8-22 mins                     Lyanne Co, PT  Acute Rehab Services  Pager [phone] Office [phone]    Lawana Chambers Denese Mentink 10/27/2020, 2:44 PM

## 2020-10-27 NOTE — Plan of Care (Signed)
  Problem: Education: Goal: Knowledge of General Education information will improve Description Including pain rating scale, medication(s)/side effects and non-pharmacologic comfort measures Outcome: Progressing   Problem: Health Behavior/Discharge Planning: Goal: Ability to manage health-related needs will improve Outcome: Progressing   Problem: Clinical Measurements: Goal: Ability to maintain clinical measurements within normal limits will improve Outcome: Progressing Goal: Will remain free from infection Outcome: Progressing Goal: Respiratory complications will improve Outcome: Progressing Goal: Cardiovascular complication will be avoided Outcome: Progressing   Problem: Activity: Goal: Risk for activity intolerance will decrease Outcome: Progressing   Problem: Nutrition: Goal: Adequate nutrition will be maintained Outcome: Progressing   Problem: Coping: Goal: Level of anxiety will decrease Outcome: Progressing   Problem: Elimination: Goal: Will not experience complications related to bowel motility Outcome: Progressing Goal: Will not experience complications related to urinary retention Outcome: Progressing   Problem: Pain Managment: Goal: General experience of comfort will improve Outcome: Progressing   

## 2020-10-27 NOTE — TOC Progression Note (Signed)
Transition of Care [facility]) - Progression Note    Patient Details  Name: Gary [name] MRN: [id number] Date of Birth: 02-Apr-1951  Transition [facility] Care [facility]) CM/SW [place], [place] Phone Number: 10/27/2020, 3:11 PM  Clinical Narrative:    CSW met with pt and pt's daughter Gary [name] at bedside. Pt's daughter prefers [place] for SNF for pt because it is close to pt and daughters home.  CSW spoke with Occupational hygienist at [facility].  Pt has been accepted for SNF.  Pt and daughters have been updated.  Insurance Josem Kaufmann was started today.  TOC  Team will continue to assist with disposition planning.   Expected Discharge Plan: [place] Barriers to Discharge: Continued Medical Work up,Insurance Authorization,SNF Pending bed offer  Expected Discharge Plan and Services Expected Discharge Plan: [place] Choice: [place] arrangements for the past 2 months: Single Family Home                                       Social Determinants of Health (SDOH) Interventions    Readmission Risk Interventions No flowsheet data found.

## 2020-10-27 NOTE — Progress Notes (Signed)
  Speech Language Pathology Treatment: Dysphagia;Cognitive-Linquistic  Patient Details Name: Gary [name] MRN: [id number] DOB: 1951/05/19 Today's Date: 10/27/2020 Time: 1100-1120 SLP Time Calculation (min) (ACUTE ONLY): 20 min  Assessment / Plan / Recommendation Clinical Impression  Pt was asleep upon arrival this morning.  Daughter was at bedside who reports pt has been napping on and off all day due to poor sleep last night.  Pt woke easily with repositioning in bed and asked for an "oatmeal cookie."  SLP facilitated the session with trials of advanced solids to continue working towards diet progression.  Pt required increased time for mastication of solids and clearance of buccal residue from the oral cavity and appeared to fatigue quickly with trials.  For now, I recommend that pt remain on dys 1 textures and thin liquids for energy conservation but he would definitely benefit from ongoing trials of advanced solids with SLP.  Encouraged pt's daughter to bring in an oatmeal cookie to try in future treatment sessions.  When maximally alert, pt required mod assist verbal cues to slow rate of speech and increase vocal intensity to achieve intelligibility at the sentence level.  Pt was left in bed with bed alarm set and call bell within reach.  Continue per current plan of care.     HPI HPI: 69 yo admit for unresponsiveness. Found by family and imaging revealed to have large right frontal and parietal density involving most of the right MCA territory. PMH: cervical fusion- pt reports 20 years, HTN, protein calorie mannutrition, mitral regurgitation, neck fx 2000. Daughter reports diet at home c/w dys 3.      SLP Plan  Continue with current plan of care       Recommendations  Diet recommendations: Dysphagia 1 (puree);Thin liquid Liquids provided via: Cup;Straw Medication Administration: Crushed with puree Supervision: Full supervision/cueing for compensatory strategies Compensations: Minimize  environmental distractions;Slow rate;Small sips/bites;Lingual sweep for clearance of pocketing Postural Changes and/or Swallow Maneuvers: Seated upright 90 degrees;Upright 30-60 min after meal                Oral Care Recommendations: Oral care BID Follow up Recommendations: Skilled Nursing facility SLP Visit Diagnosis: Dysarthria and anarthria (R47.1);Dysphagia, oropharyngeal phase (R13.12) Plan: Continue with current plan of care       GO                PageMelanee [name] 10/27/2020, 11:34 AM

## 2020-10-27 NOTE — Telephone Encounter (Signed)
Dr. Allred made aware.  

## 2020-10-28 ENCOUNTER — Encounter: Payer: Medicare Other | Admitting: Student

## 2020-10-28 ENCOUNTER — Ambulatory Visit: Payer: Medicare Other | Admitting: Physician Assistant

## 2020-10-28 LAB — GLUCOSE, CAPILLARY
Glucose-Capillary: 108 mg/dL — ABNORMAL HIGH (ref 70–99)
Glucose-Capillary: 103 mg/dL — ABNORMAL HIGH (ref 70–99)
Glucose-Capillary: 108 mg/dL — ABNORMAL HIGH (ref 70–99)
Glucose-Capillary: 117 mg/dL — ABNORMAL HIGH (ref 70–99)
Glucose-Capillary: 121 mg/dL — ABNORMAL HIGH (ref 70–99)

## 2020-10-28 LAB — CBC WITH DIFFERENTIAL/PLATELET
Abs Immature Granulocytes: 0.16 10*3/uL — ABNORMAL HIGH (ref 0.00–0.07)
Basophils Absolute: 0 10*3/uL (ref 0.0–0.1)
Basophils Relative: 0 %
Eosinophils Absolute: 0.2 10*3/uL (ref 0.0–0.5)
Eosinophils Relative: 1 %
HCT: 35.9 % — ABNORMAL LOW (ref 39.0–52.0)
Hemoglobin: 12.2 g/dL — ABNORMAL LOW (ref 13.0–17.0)
Immature Granulocytes: 2 %
Lymphocytes Relative: 7 %
Lymphs Abs: 0.8 10*3/uL (ref 0.7–4.0)
MCH: 32.2 pg (ref 26.0–34.0)
MCHC: 34 g/dL (ref 30.0–36.0)
MCV: 94.7 fL (ref 80.0–100.0)
Monocytes Absolute: 0.8 10*3/uL (ref 0.1–1.0)
Monocytes Relative: 7 %
Neutro Abs: 8.7 10*3/uL — ABNORMAL HIGH (ref 1.7–7.7)
Neutrophils Relative %: 83 %
Platelets: 315 10*3/uL (ref 150–400)
RBC: 3.79 MIL/uL — ABNORMAL LOW (ref 4.22–5.81)
RDW: 14 % (ref 11.5–15.5)
WBC: 10.6 10*3/uL — ABNORMAL HIGH (ref 4.0–10.5)
nRBC: 0 % (ref 0.0–0.2)

## 2020-10-28 LAB — RESP PANEL BY RT-PCR (FLU A&B, COVID) ARPGX2
Influenza A by PCR: NEGATIVE
Influenza B by PCR: NEGATIVE
SARS Coronavirus 2 by RT PCR: NEGATIVE

## 2020-10-28 LAB — BASIC METABOLIC PANEL
Anion gap: 9 (ref 5–15)
BUN: 12 mg/dL (ref 8–23)
CO2: 27 mmol/L (ref 22–32)
Calcium: 8.3 mg/dL — ABNORMAL LOW (ref 8.9–10.3)
Chloride: 101 mmol/L (ref 98–111)
Creatinine, Ser: 0.64 mg/dL (ref 0.61–1.24)
GFR, Estimated: >60 mL/min (ref >60)
Glucose, Bld: 107 mg/dL — ABNORMAL HIGH (ref 70–99)
Potassium: 3.8 mmol/L (ref 3.5–5.1)
Sodium: 137 mmol/L (ref 135–145)

## 2020-10-28 MED ORDER — EZETIMIBE 10 MG PO TABS: 10.0000 mg | ORAL_TABLET | Freq: Every day | ORAL | 0 refills | Status: DC

## 2020-10-28 MED ORDER — PROSOURCE PLUS PO LIQD: 30.0000 mL | Freq: Two times a day (BID) | ORAL | Status: AC

## 2020-10-28 MED ORDER — ENSURE ENLIVE PO LIQD: 237.0000 mL | Freq: Three times a day (TID) | ORAL | 12 refills | Status: AC

## 2020-10-28 MED ORDER — ASPIRIN 325 MG PO TBEC: 325.0000 mg | DELAYED_RELEASE_TABLET | Freq: Every day | ORAL | 0 refills | Status: AC

## 2020-10-28 MED ORDER — APIXABAN 5 MG PO TABS: 5.0000 mg | ORAL_TABLET | Freq: Two times a day (BID) | ORAL | 0 refills | Status: AC

## 2020-10-28 MED ORDER — ADULT MULTIVITAMIN W/MINERALS CH: 1.0000 | ORAL_TABLET | Freq: Every day | ORAL | Status: AC

## 2020-10-28 NOTE — TOC Transition Note (Signed)
Transition of Care [facility]) - CM/SW Discharge Note   Patient Details  Name: Gary [name] MRN: [id number] Date of Birth: 10/28/1951  Transition of Care [facility]) CM/SW Contact:  Gary [name] Phone Number:[phone] 10/28/2020, 3:30 PM   Clinical Narrative:    Patient will Discharge To: [facility] Anticipated DC Date:10/28/20 Family Notified:yes, daughter Gary [name], [phone] Transport By: Sharin Mons   Per MD patient ready for DC to [facility] . RN, patient, patient's family, and facility notified of DC. Assessment, Fl2/Pasrr, and Discharge Summary sent to facility. RN given number for report [phone], Room # 412B). DC packet on chart. Ambulance transport requested for patient.   CSW signing off.  Budd Palmer LCSWA [phone]     Final next level of care: Skilled Nursing Facility Barriers to Discharge: No Barriers Identified   Patient Goals and CMS Choice Patient states their goals for this hospitalization and ongoing recovery are:: Pt unable to participate in goal setting due to disorientation. CMS Medicare.gov Compare Post Acute Care list provided to:: Patient Represenative (must comment) Gary [name], daughter) Choice offered to / list presented to : Adult Children  Discharge Placement              Patient chooses bed at:  ([facility]) Patient to be transferred to facility by: [facility] Name of family member notified: Gary [name] Patient and family notified of of transfer: 10/28/20  Discharge Plan and Services     Post Acute Care Choice: Skilled Nursing Facility                               Social Determinants of Health (SDOH) Interventions     Readmission Risk Interventions No flowsheet data found.

## 2020-10-28 NOTE — TOC Progression Note (Addendum)
Transition of Care [facility]) - Progression Note    Patient Details  Name: Gary [name] MRN: [id number] Date of Birth: August 07, 1951  Transition of Care [facility]) CM/SW Contact  Levada Schilling Phone Number: 10/28/2020, 8:33 AM  Clinical Narrative:    CSW spoke with Efraim Kaufmann from [facility].  Facility has received insurance auth.  Talbot Grumbling #- [id number] Health Plan # [id number]  Case Manager: Caryl Ada Authorization dates: Dec 21- Dec 23 Fax Number: [phone]   TOC Team will continue to assist with disposition planning.  Expected Discharge Plan: Skilled Nursing Facility Barriers to Discharge: Continued Medical Work up,Insurance Authorization,SNF Pending bed offer  Expected Discharge Plan and Services Expected Discharge Plan: Skilled Nursing Facility     Post Acute Care Choice: Skilled Nursing Facility Living arrangements for the past 2 months: Single Family Home                                       Social Determinants of Health (SDOH) Interventions    Readmission Risk Interventions No flowsheet data found.

## 2020-10-28 NOTE — Progress Notes (Signed)
Physical Therapy Treatment Patient Details Name: Gary [name] MRN: [id number] DOB: 03-13-1951 Today's Date: 10/28/2020    History of Present Illness The pt is a 69 yo male presenting after being found down at home. Upon work up, pt found to have "large hypodensity of right frontal and parietal lobes involving almost the entire right MCA territory." PMH includes: CAD with NSTEMI 2014 s/p balloon stent placement, HLD, HTN, cardiomyopathy, MR, pacemaker, and afib.    PT Comments    Patient continues with left inattention, making safe mobility more complicated and requires +2 mod to max assist overall. OT able to address left shoulder subluxation during standing activity to reduce shoulder pain (pt stating shoulder hurting and able to identify left shoulder). Attempted LLE activation in standing with no active movement noted.    Follow Up Recommendations  SNF;Supervision/Assistance - 24 hour     Equipment Recommendations  Other (comment) (defer)    Recommendations for Other Services       Precautions / Restrictions Precautions Precautions: Fall Precaution Comments: Left hemiplegia, neglect, and visual deficits. Patient could not read at baseline.    Mobility  Bed Mobility Overal bed mobility: Needs Assistance Bed Mobility: Supine to Sit     Supine to sit: Mod assist     General bed mobility comments: mod A from flat bed. Pt able to slide RLE off bed, min A to LLE. Mod A at trunk  Transfers Overall transfer level: Needs assistance   Transfers: Sit to/from Stand;Stand Pivot Transfers Sit to Stand: +2 safety/equipment;Mod assist;+2 physical assistance Stand pivot transfers: +2 physical assistance;+2 safety/equipment;Mod assist       General transfer comment: stood with +2 HHA (OT on left reducing subluxation at shoulder), rt knee hyperextends and pt unable to fully activate LLE to prevent buckling (knee stays in flexion) with weight shifted over RLE; pt with poor  coordination of wt-shifting to allow advancing each leg during step-pivot; pt advances RLE and LLE blocked and then assisted to advance towards chair  Ambulation/Gait                 Stairs             Wheelchair Mobility    Modified Rankin (Stroke Patients Only) Modified Rankin (Stroke Patients Only) Pre-Morbid Rankin Score: Slight disability Modified Rankin: Moderately severe disability     Balance Overall balance assessment: Needs assistance Sitting-balance support: Single extremity supported;Feet supported Sitting balance-Leahy Scale: Poor Sitting balance - Comments: min A to mn guard sit EOB with  L lean noted. Postural control: Left lateral lean Standing balance support: During functional activity;Bilateral upper extremity supported Standing balance-Leahy Scale: Poor Standing balance comment: max A +2 to maintain standing                            Cognition Arousal/Alertness: Awake/alert Behavior During Therapy: Flat affect Overall Cognitive Status: Impaired/Different from baseline Area of Impairment: Attention;Memory;Following commands;Safety/judgement;Problem solving;Awareness                   Current Attention Level: Sustained Memory: Decreased short-term memory Following Commands: Follows one step commands with increased time;Follows one step commands inconsistently Safety/Judgement: Decreased awareness of safety;Decreased awareness of deficits Awareness: Intellectual Problem Solving: Difficulty sequencing;Requires tactile cues;Requires verbal cues;Slow processing;Decreased initiation General Comments: inattention persists and contributes to what appears to be decr cognition      Exercises      General Comments  Pertinent Vitals/Pain Pain Assessment: Faces Faces Pain Scale: Hurts little more Pain Location: left shoulder Pain Descriptors / Indicators:  ("hurts") Pain Intervention(s): Limited activity within  patient's tolerance;Monitored during session;Repositioned (OT reducing subluxation during standing/transfer)    Home Living                      Prior Function            PT Goals (current goals can now be found in the care plan section) Acute Rehab PT Goals Patient Stated Goal: get out of bed Time For Goal Achievement: 11/02/20 Potential to Achieve Goals: Good Progress towards PT goals: Progressing toward goals    Frequency    Min 3X/week      PT Plan Current plan remains appropriate    Co-evaluation PT/OT/SLP Co-Evaluation/Treatment: Yes Reason for Co-Treatment: Complexity of the patient's impairments (multi-system involvement);For patient/therapist safety;To address functional/ADL transfers PT goals addressed during session: Mobility/safety with mobility;Balance        AM-PAC PT "6 Clicks" Mobility   Outcome Measure  Help needed turning from your back to your side while in a flat bed without using bedrails?: A Lot Help needed moving from lying on your back to sitting on the side of a flat bed without using bedrails?: A Lot Help needed moving to and from a bed to a chair (including a wheelchair)?: A Lot Help needed standing up from a chair using your arms (e.g., wheelchair or bedside chair)?: A Lot Help needed to walk in hospital room?: Total Help needed climbing 3-5 steps with a railing? : Total 6 Click Score: 10    End of Session Equipment Utilized During Treatment: Gait belt Activity Tolerance: Patient limited by fatigue Patient left: in chair;with call bell/phone within reach;with chair alarm set;with nursing/sitter in room;Other (comment) (with OT to address feeding (lunch)) Nurse Communication: Mobility status PT Visit Diagnosis: Unsteadiness on feet (R26.81);Other abnormalities of gait and mobility (R26.89);Hemiplegia and hemiparesis;Muscle weakness (generalized) (M62.81);Difficulty in walking, not elsewhere classified (R26.2);Other symptoms and  signs involving the nervous system (R29.898) Hemiplegia - Right/Left: Left Hemiplegia - dominant/non-dominant: Non-dominant Hemiplegia - caused by: Cerebral infarction     Time: 7564-3329 PT Time Calculation (min) (ACUTE ONLY): 21 min  Charges:  $Neuromuscular Re-education: 8-22 mins                      Jerolyn Center, PT Pager [phone]    Zena Amos 10/28/2020, 12:30 PM

## 2020-10-28 NOTE — Plan of Care (Signed)
  Problem: Education: Goal: Knowledge of General Education information will improve Description: Including pain rating scale, medication(s)/side effects and non-pharmacologic comfort measures Outcome: Progressing   Problem: Health Behavior/Discharge Planning: Goal: Ability to manage health-related needs will improve Outcome: Progressing   Problem: Clinical Measurements: Goal: Ability to maintain clinical measurements within normal limits will improve Outcome: Progressing Goal: Will remain free from infection Outcome: Progressing Goal: Diagnostic test results will improve Outcome: Progressing Goal: Respiratory complications will improve Outcome: Progressing Goal: Cardiovascular complication will be avoided Outcome: Progressing   Problem: Activity: Goal: Risk for activity intolerance will decrease Outcome: Progressing   Problem: Nutrition: Goal: Adequate nutrition will be maintained Outcome: Progressing   Problem: Coping: Goal: Level of anxiety will decrease Outcome: Progressing   Problem: Pain Managment: Goal: General experience of comfort will improve Outcome: Progressing   Problem: Skin Integrity: Goal: Risk for impaired skin integrity will decrease Outcome: Not Progressing

## 2020-10-28 NOTE — Progress Notes (Signed)
Occupational Therapy Treatment Patient Details Name: Gary [name] MRN: [id number] DOB: [birth year] Today's Date: 10/28/2020    History of present illness The pt is a 69 yo male presenting after being found down at home. Upon work up, pt found to have "large hypodensity of right frontal and parietal lobes involving almost the entire right MCA territory." PMH includes: CAD with NSTEMI 2014 s/p balloon stent placement, HLD, HTN, cardiomyopathy, MR, pacemaker, and afib.   OT comments  Pt agreeable to OOB and making jokes about being a cat with 9 lives during session. Pt progressed to chair total +2 MOD (A) with LLE blocked. Pt able to verbalize concern for falling and LLE weakness awareness. Recommendation for SNF at this time.   Follow Up Recommendations  SNF    Equipment Recommendations  3 in 1 bedside commode;Wheelchair (measurements OT);Wheelchair cushion (measurements OT)    Recommendations for Other Services      Precautions / Restrictions Precautions Precautions: Fall Precaution Comments: Left hemiplegia, neglect, and visual deficits. Patient could not read at baseline.       Mobility Bed Mobility Overal bed mobility: Needs Assistance Bed Mobility: Supine to Sit     Supine to sit: Mod assist     General bed mobility comments: mod A from flat bed. Pt able to slide RLE off bed, min A to LLE. Mod A at trunk  Transfers Overall transfer level: Needs assistance   Transfers: Sit to/from Stand;Stand Pivot Transfers Sit to Stand: +2 safety/equipment;Mod assist;+2 physical assistance Stand pivot transfers: +2 physical assistance;+2 safety/equipment;Mod assist       General transfer comment: stood with +2 HHA (OT on left reducing subluxation at shoulder), rt knee hyperextends and pt unable to fully activate LLE to prevent buckling (knee stays in flexion) with weight shifted over RLE; pt with poor coordination of wt-shifting to allow advancing each leg during step-pivot; pt  advances RLE and LLE blocked and then assisted to advance towards chair    Balance Overall balance assessment: Needs assistance Sitting-balance support: Single extremity supported;Feet supported Sitting balance-Leahy Scale: Poor Sitting balance - Comments: min A to mn guard sit EOB with  L lean noted. Postural control: Left lateral lean Standing balance support: During functional activity;Bilateral upper extremity supported Standing balance-Leahy Scale: Poor Standing balance comment: max A +2 to maintain standing                           ADL either performed or assessed with clinical judgement   ADL Overall ADL's : Needs assistance/impaired Eating/Feeding: Maximal assistance Eating/Feeding Details (indicate cue type and reason): taking 2 bites of ice cream and 2 sips of mt dew only. pt states not hungry Grooming: Maximal assistance               Lower Body Dressing: Total assistance                 General ADL Comments: progressed oob to chair for lunch     Vision   Additional Comments: pt sitting at eob state "i see a bird out there " pt was unable to describe where or what color bird was. pt seeing objects at times that are not present or colors that are incorrect   Perception     Praxis      Cognition Arousal/Alertness: Awake/alert Behavior During Therapy: Flat affect Overall Cognitive Status: Impaired/Different from baseline Area of Impairment: Attention;Memory;Following commands;Safety/judgement;Problem solving;Awareness  Current Attention Level: Sustained Memory: Decreased short-term memory Following Commands: Follows one step commands with increased time;Follows one step commands inconsistently Safety/Judgement: Decreased awareness of safety;Decreased awareness of deficits Awareness: Intellectual Problem Solving: Difficulty sequencing;Requires tactile cues;Requires verbal cues;Slow processing;Decreased  initiation General Comments: inattention persists and contributes to what appears to be decr cognition        Exercises     Shoulder Instructions       General Comments Jerome 2 L this session.    Pertinent Vitals/ Pain       Pain Assessment: Faces Faces Pain Scale: Hurts little more Pain Location: left shoulder Pain Descriptors / Indicators:  ("hurts") Pain Intervention(s): Monitored during session;Limited activity within patient's tolerance  Home Living                                          Prior Functioning/Environment              Frequency  Min 2X/week        Progress Toward Goals  OT Goals(current goals can now be found in the care plan section)  Progress towards OT goals: Progressing toward goals  Acute Rehab OT Goals Patient Stated Goal: get out of bed OT Goal Formulation: With patient Time For Goal Achievement: 11/02/20 Potential to Achieve Goals: Good ADL Goals Pt Will Perform Grooming: with min assist;sitting Pt Will Perform Upper Body Dressing: with min assist;sitting Pt Will Perform Lower Body Dressing: with mod assist;sit to/from stand Pt Will Transfer to Toilet: with mod assist;bedside commode;stand pivot transfer Pt Will Perform Toileting - Clothing Manipulation and hygiene: with min assist;sitting/lateral leans;sit to/from stand Additional ADL Goal #1: Pt will locate ADL items at midline 50% of time with Min cues  Plan Discharge plan needs to be updated    Co-evaluation    PT/OT/SLP Co-Evaluation/Treatment: Yes Reason for Co-Treatment: Complexity of the patient's impairments (multi-system involvement);Necessary to address cognition/behavior during functional activity;For patient/therapist safety;To address functional/ADL transfers PT goals addressed during session: Mobility/safety with mobility;Balance OT goals addressed during session: ADL's and self-care;Proper use of Adaptive equipment and DME;Strengthening/ROM       AM-PAC OT "6 Clicks" Daily Activity     Outcome Measure   Help from another person eating meals?: A Lot Help from another person taking care of personal grooming?: A Lot Help from another person toileting, which includes using toliet, bedpan, or urinal?: A Lot Help from another person bathing (including washing, rinsing, drying)?: A Lot Help from another person to put on and taking off regular upper body clothing?: A Lot Help from another person to put on and taking off regular lower body clothing?: A Lot 6 Click Score: 12    End of Session Equipment Utilized During Treatment: Gait belt  OT Visit Diagnosis: Unsteadiness on feet (R26.81);Muscle weakness (generalized) (M62.81);Other abnormalities of gait and mobility (R26.89);Pain Pain - Right/Left: Left Pain - part of body: Shoulder   Activity Tolerance Patient tolerated treatment well   Patient Left in chair;with call bell/phone within reach;with chair alarm set;with nursing/sitter in room;with family/visitor present   Nurse Communication Mobility status;Precautions        Time: 3151-7616 OT Time Calculation (min): 44 min  Charges: OT General Charges $OT Visit: 1 Visit OT Treatments $Self Care/Home Management : 23-37 mins   Brynn, OTR/L  Acute Rehabilitation Services Pager: [phone] Office: [phone] .    Mateo Flow 10/28/2020, 2:20 PM

## 2020-10-28 NOTE — Progress Notes (Signed)
Attempted to call report to facility. Unable to get in contact with nursing staff at this time. Will call again later.

## 2020-10-28 NOTE — Discharge Summary (Signed)
Physician Discharge Summary  Gary [name] NWG:[id number] DOB: Jun 26, 1951 DOA: 10/18/2020  PCP: Jason Coop, FNP  Admit date: 10/18/2020 Discharge date: 10/28/2020  Admitted From: Home  disposition: SNF  Recommendations for Outpatient Follow-up:  1. Follow-up with PCP after discharge from SNF 2. Repeat CBC in BMP on follow-up visit 3. Start taking Eliquis 5 mg twice daily from 11/02/2020 4. Follow-up with neurology outpatient 5. Continue aspirin 325 mg daily until he starts taking Eliquis  Home Health: None Equipment/Devices: None Discharge Condition: Stable CODE STATUS: DNR Diet recommendation: Dysphagia 1 diet  Brief/Interim Summary: Patient is 69 year old male with past medical history of A. fib-not on anticoagulation, hypertension, hyperlipidemia, ischemic cardiomyopathy, coronary artery disease presents to emergency department for unresponsiveness.   Patient was found down by family and admitted with code stroke. Last known normal on December 8. Initially presented to any pain and found to have right MCA territory infarction. Seen by neurology and thought to be outside window for reperfusion. Patient admitted to ICU for hypertonic saline administration. Did have some shortness of breath and was placed on 15 L of oxygen.  The patient's sodium improved, and his oxygen requirements improved. The patient was transferred to the floor to the care of [facility] on 10/21/2020.  On 10/22/2020 the patient passed a swallow eval and was started on a dysphagia 1 diet. NGT was removed. Calorie count started.   Right MCA territory infarct: Secondary to embolic source for unknown atrial fibrillation-not on anticoagulation -CT head shows large right frontal and parietal lobes hypodensity with minimal mass-effect. Small vessel disease. CTA head and neck shows right MCA infarct. Right ICA soft and calcified plaque with 50% bulb stenosis. Right M2 superior stenosis.  -Patient has pacemaker which is not compatible with MRI.  -Transthoracic echo shows ejection fraction of 50 to 55% with grade 1 diastolic dysfunction., No intracardiac source of embolism detected. -A1c: 5.9%, LDL: 190,UDS: Negative. - Per neurology recommendations: continue aspirin-recommend to start NOACs after 14 days of stroke.  Neurology signed off.  Appreciate help. -Consulted PT/OT recommended SNF  -I discussed with Dr. Eduardo Osier.  He recommended NO DOAC once he started on Eliquis since his stroke is due to not being on anticoagulation.  Continue aspirin 325 mg daily until he starts taking Eliquis-which would be on 11/02/2020.  acute hypoxemic respiratory failure: Chest x-ray shows atelectasis left lower lobe.  -His oxygen saturation improved.  Initially requiring 15 L-weaned off to 2 L.  Dysphagia: In the setting of recent stroke.  NG tube was placed initially for nutrition which was later removed after he was able to swallow and advanced his diet to dysphagia 1 diet.   Severe protein calorie malnutrition: -Appreciate dietitian recommendations.continued feeding supplement and multivitamins.  paroxysmal A. fib: Not on anticoagulation at home. Per note patient refused anticoagulation in the past. Neurology recommended anticoagulation in 10 to 14 days post stroke to avoid hemorrhagic transformation. -Patient agreed to take anticoagulation.  Patient will be discharged on Eliquis 5 mg twice daily-start date would be on 11/02/2020.  Hypertension:  Remained stable.  Resume home meds at the time of discharge.  Hyperlipidemia: LDL: 190. Patient had side effect of myalgia with the statin. We will continue his Zetia at the time of discharge.  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 Echo shows ejection fraction of 50 to 55% with grade 1 diastolic dysfunction. Patient appeared euvolemic on exam. Continued strict I&O's and daily  weights.  Hypernatremia: Resolved  Hypophosphatemia:Replenished.  Leukocytosis:Likely reactive/stress induced.   Pressure injuries: POA  Pressure Injury 10/19/20 Hip Left Deep Tissue Pressure Injury - Purple or maroon localized area of discolored intact skin or blood-filled blister due to damage of underlying soft tissue from pressure and/or shear. (Active)  10/19/20 1419  Location: Hip  Location Orientation: Left  Staging: Deep Tissue Pressure Injury - Purple or maroon localized area of discolored intact skin or blood-filled blister due to damage of underlying soft tissue from pressure and/or shear.  Wound Description (Comments):   Present on Admission: Yes    Pressure Injury 10/19/20 Knee Right;Medial Deep Tissue Pressure Injury - Purple or maroon localized area of discolored intact skin or blood-filled blister due to damage of underlying soft tissue from pressure and/or shear. (Active)  10/19/20 1419  Location: Knee  Location Orientation: Right;Medial  Staging: Deep Tissue Pressure Injury - Purple or maroon localized area of discolored intact skin or blood-filled blister due to damage of underlying soft tissue from pressure and/or shear.  Wound Description (Comments):   Present on Admission: Yes   Disposition: Patient discharged in a stable condition to nursing home.  Plan of care discussed with patient's daughter on the phone and she verbalized understanding.  Discharge Diagnoses:  Right MCA territory infarct Acute hypoxemic respiratory failure Dysphagia Severe protein calorie malnutrition Paroxysmal A. fib Hypertension Hyperlipidemia Chronic combined systolic and diastolic CHF/ischemic cardiomyopathy status post pacemaker placement Hyponatremia Hypophosphatemia Leukocytosis Pressure injuries   Discharge Instructions  Discharge Instructions    Ambulatory referral to Neurology   Complete by: As directed    Follow up with stroke clinic NP (Jessica Vanschaick or  Darrol Angel, if both not available, consider Manson Allan, or A[facility]) at [facility] in about 4 weeks. Thanks.   Change dressing (specify)   Complete by: As directed    Dressing change: every 3 days   Increase activity slowly   Complete by: As directed      Allergies as of 10/28/2020      Reactions   Codeine Other (See Comments)   Patient "is not himself", gets aggitated   Morphine And Related    Extreme agitation (pulls out IVs)   Statins    Causes muscle fatigue      Medication List    TAKE these medications   (feeding supplement) PROSource Plus liquid Take 30 mLs by mouth 2 (two) times daily between meals.   feeding supplement Liqd Take 237 mLs by mouth 3 (three) times daily between meals.   acetaminophen 500 MG tablet Commonly known as: TYLENOL Take 500 mg by mouth every 6 (six) hours as needed for moderate pain.   ALPRAZolam 0.5 MG tablet Commonly known as: XANAX Take 0.5 mg by mouth at bedtime as needed.   amLODipine 5 MG tablet Commonly known as: NORVASC Take 5 mg by mouth daily.   apixaban 5 MG Tabs tablet Commonly known as: Eliquis Take 1 tablet (5 mg total) by mouth 2 (two) times daily. Start taking on: November 02, 2020   aspirin 325 MG EC tablet Take 1 tablet (325 mg total) by mouth daily for 4 days. Start taking on: October 29, 2020 What changed:   medication strength  how much to take   carvedilol 25 MG tablet Commonly known as: COREG TAKE (1) TABLET TWICE A DAY WITH MEALS (BREAKFAST AND SUPPER) What changed: See the new instructions.   escitalopram 5 MG tablet Commonly known as: LEXAPRO Take 5 mg by mouth daily.   ezetimibe 10 MG tablet Commonly known as:  Zetia Take 1 tablet (10 mg total) by mouth daily.   isosorbide mononitrate 30 MG 24 hr tablet Commonly known as: Imdur Take 1 tablet (30 mg total) by mouth daily.   losartan 100 MG tablet Commonly known as: COZAAR Take 100 mg by mouth daily.   multivitamin with minerals Tabs  tablet Take 1 tablet by mouth daily. Start taking on: October 29, 2020   nitroGLYCERIN 0.4 MG SL tablet Commonly known as: NITROSTAT Place 1 tablet (0.4 mg total) under the tongue every 5 (five) minutes as needed for chest pain (up to 3 doses).   ONE TOUCH ULTRA TEST test strip Generic drug: glucose blood   OneTouch Delica Lancets 33G Misc   senna 8.6 MG Tabs tablet Commonly known as: SENOKOT Take 1 tablet by mouth every other day.   Vitamin D (Ergocalciferol) 1.25 MG (50000 UNIT) Caps capsule Commonly known as: DRISDOL Take 50,000 Units by mouth once a week.            Discharge Care Instructions  (From admission, onward)         Start     Ordered   10/28/20 0000  Change dressing (specify)       Comments: Dressing change: every 3 days   10/28/20 1346          Contact information for follow-up providers    [facility]. Schedule an appointment as soon as possible for a visit in 4 week(s).   Specialty: Neurology Contact information: [address] [place] [address] [phone]           Contact information for after-discharge care    Destination    [facility] .   Service: Skilled Nursing Contact information: [address] [place] [address] [phone]                 Allergies  Allergen Reactions  . Codeine Other (See Comments)    Patient "is not himself", gets aggitated  . Morphine And Related     Extreme agitation (pulls out IVs)  . Statins     Causes muscle fatigue    Consultations:  Neurology   Procedures/Studies: CT ANGIO HEAD W OR WO CONTRAST  Result Date: 10/19/2020 CLINICAL DATA:  Follow-up stroke. EXAM: CT ANGIOGRAPHY HEAD AND NECK TECHNIQUE: Multidetector CT imaging of the head and neck was performed using the standard protocol during bolus administration of intravenous contrast. Multiplanar CT image reconstructions and MIPs were obtained to evaluate  the vascular anatomy. Carotid stenosis measurements (when applicable) are obtained utilizing NASCET criteria, using the distal internal carotid diameter as the denominator. CONTRAST:  31mL OMNIPAQUE IOHEXOL 350 MG/ML SOLN COMPARISON:  Head CT yesterday FINDINGS: CT HEAD FINDINGS Brain: No focal abnormality affects the brainstem or cerebellum. Left cerebral hemisphere shows mild chronic small-vessel change of the white matter. On the right, there is acute/subacute infarction within the right MCA inferior division with low-density and swelling. No hemorrhagic transformation. Mild mass effect no midline shift. No new area of involvement compared to yesterday's study. Vascular: There is atherosclerotic calcification of the major vessels at the base of the brain. Skull: Negative Sinuses: Clear/normal Orbits: None Review of the MIP images confirms the above findings CTA NECK FINDINGS Aortic arch: Aortic atherosclerotic calcification. No aneurysm or dissection. Branching pattern is normal. 30% stenosis of the proximal left subclavian artery origin. Right carotid system: Common carotid artery shows some scattered plaque but is widely patent to the bifurcation. Extensive soft and  calcified plaque at the carotid bifurcation and ICA bulb. Diameter at the proximal bulb is 1.5 mm. Compared to a more distal cervical ICA diameter of 3 mm, this indicates only a 50% stenosis, but is probably functionally greater than that. Cervical ICA is patent distal to bulb. Left carotid system: Common carotid artery shows some scattered plaque but is widely patent to the bifurcation region. Calcified plaque at the bifurcation calcified and soft plaque affecting the ICA bulb. Minimal diameter of the distal bulb is 3 mm, the same as more distal cervical ICA, therefore no stenosis. Vertebral arteries: 30% stenosis of the dominant right vertebral artery origin. 50% stenosis of the non dominant left vertebral artery origin. Beyond that, both  vertebral arteries are widely patent through the cervical region to foramen magnum. Skeleton: Previous ACDF C4 through C6.  No acute bone finding. Other neck: No mass or lymphadenopathy. Upper chest: Emphysema. Pulmonary scarring. At least partial collapse of the left lower lobe. Review of the MIP images confirms the above findings CTA HEAD FINDINGS Anterior circulation: Both internal carotid arteries are patent through the skull base and siphon regions. Ordinary siphon atherosclerotic calcification without additional stenosis. On the right, the anterior cerebral artery is patent. There is occlusion of the right MCA inferior division M2 branch. There is stenosis the right MCA superior division M2 branch, probably due to additional nonocclusive embolus. On the left, the anterior and middle cerebral vessels are patent. Posterior circulation: Both vertebral arteries are patent to the basilar. No basilar stenosis. Posterior circulation branch vessels are patent. Mild atherosclerotic irregularity within the more distal PCA branches. Venous sinuses: Patent and normal. Anatomic variants: None significant. Review of the MIP images confirms the above findings IMPRESSION: 1. Acute/subacute infarction within the right MCA inferior division territory with low-density and swelling. No hemorrhagic transformation. Mild mass effect but no midline shift. 2. Extensive soft and calcified plaque at the right carotid bifurcation and ICA bulb. 50% stenosis of the proximal bulb. Luminal diameter is only 1.5 mm, therefore the stenosis could be functionally greater than that measured using NASCET criteria. 3. 30% stenosis of the dominant right vertebral artery origin. 50% stenosis of the non dominant left vertebral artery origin. 4. Occlusion of the right MCA inferior division M2 branch. Stenosis the right MCA superior division M2 branch which could be due to additional nonocclusive embolus. 5. Emphysema. At least partial collapse of the  left lower lobe. 6. Emphysema and aortic atherosclerosis. Aortic Atherosclerosis (ICD10-I70.0) and Emphysema (ICD10-J43.9). Electronically Signed   By: Paulina Fusi M.D.   On: 10/19/2020 10:12   DG Shoulder Right  Result Date: 10/18/2020 CLINICAL DATA:  Fall EXAM: RIGHT SHOULDER - 2+ VIEW COMPARISON:  None. FINDINGS: Degenerative changes in the [facility] joint with joint space narrowing and spurring. Glenohumeral joint is maintained. No acute bony abnormality. Specifically, no fracture, subluxation, or dislocation. Soft tissues are intact. IMPRESSION: Degenerative changes in the right AC joint. No acute bony abnormality. Electronically Signed   By: Charlett Nose M.D.   On: 10/18/2020 19:41   DG Elbow Complete Left  Result Date: 10/18/2020 CLINICAL DATA:  Fall EXAM: LEFT ELBOW - COMPLETE 3+ VIEW COMPARISON:  None. FINDINGS: There is no evidence of fracture, dislocation, or joint effusion. There is no evidence of arthropathy or other focal bone abnormality. Soft tissues are unremarkable. IMPRESSION: Negative. Electronically Signed   By: Charlett Nose M.D.   On: 10/18/2020 19:40   CT HEAD WO CONTRAST  Result Date: 10/21/2020 CLINICAL DATA:  69 year old male  found down. Right MCA infarct. Atrial fibrillation. EXAM: CT HEAD WITHOUT CONTRAST TECHNIQUE: Contiguous axial images were obtained from the base of the skull through the vertex without intravenous contrast. COMPARISON:  Head CT 10/18/2020. FINDINGS: Brain: Stable confluent cytotoxic edema throughout much of the right MCA territory. Mild mass effect on the right lateral ventricle, although no midline shift. No hemorrhagic transformation. Stable gray-white matter differentiation elsewhere. No ventriculomegaly. Basilar cisterns remain normal. Vascular: Calcified atherosclerosis at the skull base. Skull: No acute osseous abnormality identified. Sinuses/Orbits: Stable. 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 Other  Visualized paranasal sinuses and mastoids are stable and well pneumatized. Other: Visualized orbits and scalp soft tissues are within normal limits. IMPRESSION: 1. Stable subacute large Right MCA infarct with no hemorrhagic transformation or significant mass effect. 2. No new intracranial abnormality. Electronically Signed   By: Odessa Fleming M.D.   On: 10/21/2020 08:17   CT Head Wo Contrast  Result Date: 10/18/2020 CLINICAL DATA:  Neuro deficit, stroke suspected, found on floor, unknown last normal, right facial droop EXAM: CT HEAD WITHOUT CONTRAST TECHNIQUE: Contiguous axial images were obtained from the base of the skull through the vertex without intravenous contrast. COMPARISON:  None. FINDINGS: Brain: There is a large, hemispheric hypodensity of the right frontal and parietal lobes involving almost the entire right MCA territory with associated sulcal effacement. There is minimal mass effect on the right lateral ventricle without significant midline shift. There is otherwise periventricular and deep white matter hypodensity throughout. No evidence of hemorrhage. Vascular: No hyperdense vessel or unexpected calcification. Skull: Normal. Negative for fracture or focal lesion. Sinuses/Orbits: No acute finding. Other: None. IMPRESSION: 1. There is a large, hemispheric hypodensity of the right frontal and parietal lobes involving almost the entire right MCA territory with associated sulcal effacement. There is minimal mass effect on the right lateral ventricle without significant midline shift. Findings are consistent with late acute to subacute right MCA territory infarction. No evidence of hemorrhage. 2. Underlying small-vessel white matter disease. Electronically Signed   By: Lauralyn Primes M.D.   On: 10/18/2020 15:43   CT ANGIO NECK W OR WO CONTRAST  Result Date: 10/19/2020 CLINICAL DATA:  Follow-up stroke. EXAM: CT ANGIOGRAPHY HEAD AND NECK TECHNIQUE: Multidetector CT imaging of the head and neck was  performed using the standard protocol during bolus administration of intravenous contrast. Multiplanar CT image reconstructions and MIPs were obtained to evaluate the vascular anatomy. Carotid stenosis measurements (when applicable) are obtained utilizing NASCET criteria, using the distal internal carotid diameter as the denominator. CONTRAST:  45mL OMNIPAQUE IOHEXOL 350 MG/ML SOLN COMPARISON:  Head CT yesterday FINDINGS: CT HEAD FINDINGS Brain: No focal abnormality affects the brainstem or cerebellum. Left cerebral hemisphere shows mild chronic small-vessel change of the white matter. On the right, there is acute/subacute infarction within the right MCA inferior division with low-density and swelling. No hemorrhagic transformation. Mild mass effect no midline shift. No new area of involvement compared to yesterday's study. Vascular: There is atherosclerotic calcification of the major vessels at the base of the brain. Skull: Negative Sinuses: Clear/normal Orbits: None Review of the MIP images confirms the above findings CTA NECK FINDINGS Aortic arch: Aortic atherosclerotic calcification. No aneurysm or dissection. Branching pattern is normal. 30% stenosis of the proximal left subclavian artery origin. Right carotid system: Common carotid artery shows some scattered plaque but is widely patent to the bifurcation. Extensive soft and calcified plaque at the carotid bifurcation and ICA  bulb. Diameter at the proximal bulb is 1.5 mm. Compared to a more distal cervical ICA diameter of 3 mm, this indicates only a 50% stenosis, but is probably functionally greater than that. Cervical ICA is patent distal to bulb. Left carotid system: Common carotid artery shows some scattered plaque but is widely patent to the bifurcation region. Calcified plaque at the bifurcation calcified and soft plaque affecting the ICA bulb. Minimal diameter of the distal bulb is 3 mm, the same as more distal cervical ICA, therefore no stenosis.  Vertebral arteries: 30% stenosis of the dominant right vertebral artery origin. 50% stenosis of the non dominant left vertebral artery origin. Beyond that, both vertebral arteries are widely patent through the cervical region to foramen magnum. Skeleton: Previous ACDF C4 through C6.  No acute bone finding. Other neck: No mass or lymphadenopathy. Upper chest: Emphysema. Pulmonary scarring. At least partial collapse of the left lower lobe. Review of the MIP images confirms the above findings CTA HEAD FINDINGS Anterior circulation: Both internal carotid arteries are patent through the skull base and siphon regions. Ordinary siphon atherosclerotic calcification without additional stenosis. On the right, the anterior cerebral artery is patent. There is occlusion of the right MCA inferior division M2 branch. There is stenosis the right MCA superior division M2 branch, probably due to additional nonocclusive embolus. On the left, the anterior and middle cerebral vessels are patent. Posterior circulation: Both vertebral arteries are patent to the basilar. No basilar stenosis. Posterior circulation branch vessels are patent. Mild atherosclerotic irregularity within the more distal PCA branches. Venous sinuses: Patent and normal. Anatomic variants: None significant. Review of the MIP images confirms the above findings IMPRESSION: 1. Acute/subacute infarction within the right MCA inferior division territory with low-density and swelling. No hemorrhagic transformation. Mild mass effect but no midline shift. 2. Extensive soft and calcified plaque at the right carotid bifurcation and ICA bulb. 50% stenosis of the proximal bulb. Luminal diameter is only 1.5 mm, therefore the stenosis could be functionally greater than that measured using NASCET criteria. 3. 30% stenosis of the dominant right vertebral artery origin. 50% stenosis of the non dominant left vertebral artery origin. 4. Occlusion of the right MCA inferior division M2  branch. Stenosis the right MCA superior division M2 branch which could be due to additional nonocclusive embolus. 5. Emphysema. At least partial collapse of the left lower lobe. 6. Emphysema and aortic atherosclerosis. Aortic Atherosclerosis (ICD10-I70.0) and Emphysema (ICD10-J43.9). Electronically Signed   By: Paulina Fusi M.D.   On: 10/19/2020 10:12   DG Chest Portable 1 View  Result Date: 10/26/2020 CLINICAL DATA:  Hypoxia. EXAM: PORTABLE CHEST 1 VIEW COMPARISON:  Chest x-ray 10/19/2020. FINDINGS: Cardiac pacer noted with lead tip over the right atrium and right ventricle. Heart size normal. Mild left base infiltrate improved from prior exam. Small left pleural effusion. No pneumothorax. Prior cervical spine fusion. IMPRESSION: 1. Cardiac pacer with lead tips over the right atrium and right ventricle. 2. Mild left base infiltrate improved from prior exam. Small left pleural effusion. Electronically Signed   By: Maisie Fus  Register   On: 10/26/2020 12:11   DG CHEST PORT 1 VIEW  Result Date: 10/19/2020 CLINICAL DATA:  Hypoxia EXAM: PORTABLE CHEST 1 VIEW COMPARISON:  10/18/2020 FINDINGS: Esophageal catheter tip is below the field view. There is increased hazy opacity at the left lung base. Right lung is clear. Normal pleural spaces. IMPRESSION: Increased hazy opacity at the left lung base, possibly atelectasis. Electronically Signed   By: Deatra Robinson  M.D.   On: 10/19/2020 22:15   DG Chest Portable 1 View  Result Date: 10/18/2020 CLINICAL DATA:  Altered, unknown down time, fixed lateral gaze EXAM: PORTABLE CHEST 1 VIEW COMPARISON:  Radiograph 01/04/2017 FINDINGS: Coarsened interstitial changes and hyperinflation are similar to prior. No consolidation, features of edema, pneumothorax, or effusion. The aorta is calcified. The remaining cardiomediastinal contours are unremarkable. AICD battery pack overlies the left chest wall with leads in stable position to comparison imaging. External support devices  overlie the chest. No acute osseous or soft tissue abnormality. Prior cervical fusion. IMPRESSION: Stable chest radiograph. No acute cardiopulmonary abnormality. Aortic Atherosclerosis (ICD10-I70.0). Electronically Signed   By: Kreg Shropshire M.D.   On: 10/18/2020 15:46   DG Swallowing Func-Speech Pathology  Result Date: 10/20/2020 Objective Swallowing Evaluation: Type of Study: MBS-Modified Barium Swallow Study  Patient Details Name: Gary [name] MRN: [id number] Date of Birth: 1951-08-17 Today's Date: 10/20/2020 Time: SLP Start Time (ACUTE ONLY): 1006 -SLP Stop Time (ACUTE ONLY): 1026 SLP Time Calculation (min) (ACUTE ONLY): 20 min Past Medical History: Past Medical History: Diagnosis Date . Anxiety  . Arthritis   "right shoulder" (01/17/2014) . Broken neck (HCC) 2000 . CAD (coronary artery disease)   a. NSTEMI 09/2013: - s/p balloon angioplasty of D1, c/b dissection but flow restored with prolonged balloon inflations;  b. 12/2016 Cath: LM 40ost, LAD 40p, LCX nl,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 testing.  Fact Sheet for Patients: BloggerCourse.com  Fact Sheet for Healthcare Providers: SeriousBroker.it  This test is not yet approved or cleared by the [place] FDA and has been authorized for detection and/or diagnosis of SARS-CoV-2 by FDA under an Emergency Use Authorization (EUA). This EUA will remain in effect (meaning this test can be used) for the duration of the COVID-19 declaration under Section 564(b)(1) of the Act, 21 U.S.C. section 360bbb-3(b)(1), unless the authorization is terminated or revoked.  Performed at [facility], [address], [place], [place] [address]   Blood culture (routine x 2)     Status: None   Collection Time: 10/18/20  2:17 PM   Specimen: BLOOD RIGHT FOREARM  Result Value Ref Range Status   Specimen Description BLOOD RIGHT FOREARM BOTTLES DRAWN AEROBIC ONLY  Final   Special Requests Blood Culture adequate volume  Final   Culture   Final    NO GROWTH 5 DAYS Performed at [facility], [address], [place], [place] [address]    Report Status 10/23/2020 FINAL  Final  Blood culture (routine x 2)     Status: None   Collection Time: 10/18/20  3:10 PM   Specimen: Left Antecubital; Blood  Result Value Ref Range Status   Specimen Description   Final    LEFT ANTECUBITAL BOTTLES DRAWN AEROBIC AND ANAEROBIC   Special Requests Blood Culture adequate volume  Final   Culture   Final    NO GROWTH 5 DAYS Performed at [facility], [address], [place], [place] [address]    Report Status 10/23/2020 FINAL  Final  MRSA PCR Screening     Status: None   Collection Time: 10/19/20  3:50 AM   Specimen: Nasal Mucosa;  Nasopharyngeal  Result Value Ref Range Status   MRSA by PCR NEGATIVE NEGATIVE Final    Comment:        The GeneXpert MRSA Assay (FDA approved for NASAL specimens only), is one component of a comprehensive MRSA colonization surveillance program. It is not intended to diagnose MRSA infection nor to guide or monitor treatment for MRSA infections. Performed at [facility], [address]  [place], [place] [address]   Resp Panel by RT-PCR (Flu A&B, Covid) Nasopharyngeal Swab     Status: None   Collection Time: 10/28/20 10:11 AM   Specimen: Nasopharyngeal Swab; Nasopharyngeal(NP) swabs in vial transport medium  Result Value Ref Range Status   SARS Coronavirus 2 by RT PCR NEGATIVE NEGATIVE Final    Comment: (NOTE) SARS-CoV-2 target nucleic acids are NOT DETECTED.  The SARS-CoV-2 RNA is generally detectable in upper respiratory specimens during the acute phase of infection. The lowest concentration of SARS-CoV-2 viral copies this assay can detect is 138 copies/mL. A negative result does not preclude SARS-Cov-2 infection and should not be used as the sole basis for treatment or other patient management decisions. A negative result may occur with  improper specimen collection/handling, submission of specimen other than nasopharyngeal swab, presence of viral mutation(s) within the areas targeted by this assay, and inadequate number of viral copies(<138 copies/mL). A negative result must be combined with clinical observations, patient history, and epidemiological information. The expected result is Negative.  Fact Sheet for Patients:  BloggerCourse.com  Fact Sheet for Healthcare Providers:  SeriousBroker.it  This test is no t yet approved or cleared by the [place] FDA and  has been authorized for detection and/or diagnosis of SARS-CoV-2 by FDA under an Emergency Use Authorization (EUA). This EUA will remain  in effect  (meaning this test can be used) for the duration of the COVID-19 declaration under Section 564(b)(1) of the Act, 21 U.S.C.section 360bbb-3(b)(1), unless the authorization is terminated  or revoked sooner.       Influenza A by PCR NEGATIVE NEGATIVE Final   Influenza B by PCR NEGATIVE NEGATIVE Final    Comment: (NOTE) The Xpert Xpress SARS-CoV-2/FLU/RSV plus assay is intended as an aid in the diagnosis of influenza from Nasopharyngeal swab specimens and should not be used as a sole basis for treatment. Nasal washings and aspirates are unacceptable for Xpert Xpress SARS-CoV-2/FLU/RSV testing.  Fact Sheet for Patients: BloggerCourse.com  Fact Sheet for Healthcare Providers: SeriousBroker.it  This test is not yet approved or cleared by the [place] FDA and has been authorized for detection and/or diagnosis of SARS-CoV-2 by FDA under an Emergency Use Authorization (EUA). This EUA will remain in effect (meaning this test can be used) for the duration of the COVID-19 declaration under Section 564(b)(1) of the Act, 21 U.S.C. section 360bbb-3(b)(1), unless the authorization is terminated or revoked.  Performed at [facility], [address] [place], [place] [address]      Time coordinating discharge: Over 30 minutes  SIGNED:   Ollen Bowl, MD  [facility] 10/28/2020, 1:52 PM Pager   If 7PM-7AM, please contact night-coverage [email or web address]

## 2020-10-28 NOTE — Progress Notes (Signed)
Attempted to called report to facility. Again no response.

## 2020-10-28 NOTE — Progress Notes (Signed)
Report called to facility. Miranda, Charity fundraiser from [place].

## 2020-11-20 NOTE — Progress Notes (Signed)
No ICM remote transmission received for 11/16/2020 and next ICM transmission scheduled for 12/28/2020.   
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
## 2020-12-01 ENCOUNTER — Inpatient Hospital Stay: Payer: Medicare Other | Admitting: Adult Health

## 2020-12-16 ENCOUNTER — Telehealth: Payer: Self-pay | Admitting: Internal Medicine

## 2020-12-16 NOTE — Telephone Encounter (Signed)
Gary [name], Daughter of the patient called. The Daughter wanted to know what to do to turn off his pacemaker/ defibrillator. The patient is in a Nursing Home after having a stroke. The daughter also said the family signed a DNR for the patient. The daughter is not sure what to do to proceed.  Please advise

## 2020-12-17 NOTE — Telephone Encounter (Signed)
LMOVM for pt daughter to return the device clinic call.

## 2020-12-18 NOTE — Telephone Encounter (Signed)
Kathrene Alu states they do not have a phone line in the room for the patient monitor. I told her I will send a cell adaptor for the patient monitor.

## 2020-12-18 NOTE — Telephone Encounter (Signed)
Spoke with Gary [name] (daughter) patient had stroke and is nor at [facility] facility [phone])  in [place]. Patient has become confused and his condition has declined over the pat 2 weeks. Family does not want him to receive therapy from the ICD and would like Tachy therapies turned off. Home remote monitor is not with patient at facility and will be transported there this weekend for remote monitoring. Aware that order has to be given by Dr Johney Frame to disable tachy therapies and then [facility] rep will have to go to the facility to turn the shock feature off. Patient had in hospital DNR but daughter unsure if he has portable DNR.

## 2020-12-23 ENCOUNTER — Ambulatory Visit (INDEPENDENT_AMBULATORY_CARE_PROVIDER_SITE_OTHER): Payer: Medicare Other

## 2020-12-23 DIAGNOSIS — I472 Ventricular tachycardia, unspecified: Secondary | ICD-10-CM

## 2020-12-23 LAB — CUP PACEART REMOTE DEVICE CHECK
Battery Remaining Longevity: 33 mo
Battery Remaining Percentage: 33 %
Battery Voltage: 2.84 V
Brady Statistic AP VP Percent: 1 %
Brady Statistic AP VS Percent: 1.2 %
Brady Statistic AS VP Percent: 1 %
Brady Statistic AS VS Percent: 99 %
Brady Statistic RA Percent Paced: 1.2 %
Brady Statistic RV Percent Paced: 1 %
Date Time Interrogation Session: 20220216121420
HighPow Impedance: 68 Ohm
HighPow Impedance: 68 Ohm
Implantable Lead Implant Date: 20150313
Implantable Lead Implant Date: 20150313
Implantable Lead Location: 753859
Implantable Lead Location: 753860
Implantable Pulse Generator Implant Date: 20150313
Lead Channel Impedance Value: 350 Ohm
Lead Channel Impedance Value: 410 Ohm
Lead Channel Pacing Threshold Amplitude: 0.75 V
Lead Channel Pacing Threshold Amplitude: 0.75 V
Lead Channel Pacing Threshold Pulse Width: 0.5 ms
Lead Channel Pacing Threshold Pulse Width: 0.5 ms
Lead Channel Sensing Intrinsic Amplitude: 11.8 mV
Lead Channel Sensing Intrinsic Amplitude: 2.9 mV
Lead Channel Setting Pacing Amplitude: 2 V
Lead Channel Setting Pacing Amplitude: 2.5 V
Lead Channel Setting Pacing Pulse Width: 0.5 ms
Lead Channel Setting Sensing Sensitivity: 0.5 mV
Pulse Gen Serial Number: 7142670

## 2020-12-28 ENCOUNTER — Ambulatory Visit (INDEPENDENT_AMBULATORY_CARE_PROVIDER_SITE_OTHER): Payer: Medicare Other

## 2020-12-28 DIAGNOSIS — Z95810 Presence of automatic (implantable) cardiac defibrillator: Secondary | ICD-10-CM | POA: Diagnosis not present

## 2020-12-28 DIAGNOSIS — I5022 Chronic systolic (congestive) heart failure: Secondary | ICD-10-CM | POA: Diagnosis not present

## 2020-12-29 ENCOUNTER — Ambulatory Visit (INDEPENDENT_AMBULATORY_CARE_PROVIDER_SITE_OTHER): Payer: Medicare Other | Admitting: Adult Health

## 2020-12-29 ENCOUNTER — Encounter: Payer: Self-pay | Admitting: Adult Health

## 2020-12-29 ENCOUNTER — Inpatient Hospital Stay: Payer: Medicare Other | Admitting: Adult Health

## 2020-12-29 VITALS — BP 100/58 | HR 54 | Ht 66.0 in

## 2020-12-29 DIAGNOSIS — I63511 Cerebral infarction due to unspecified occlusion or stenosis of right middle cerebral artery: Secondary | ICD-10-CM | POA: Diagnosis not present

## 2020-12-29 MED ORDER — BACLOFEN 5 MG PO TABS: 5.0000 mg | ORAL_TABLET | Freq: Three times a day (TID) | ORAL | 5 refills | Status: AC
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
## 2020-12-29 NOTE — Patient Instructions (Addendum)
Highly recommend doing therapy sessions - encouragement will likely be very beneficial   Difficulty tolerating Zetia 10mg  daily - discontinue to see if this resolves - if resolves, please follow up with PCP for further options such as Repatha or Praulent   Start baclofen 5mg  three times daily for post stroke spasticity   Continue Eliquis (apixaban) daily  for secondary stroke prevention  Continue to follow up with PCP regarding cholesterol and blood pressure management  Maintain strict control of hypertension with blood pressure goal below 130/90 and cholesterol with LDL cholesterol (bad cholesterol) goal below 70 mg/dL.      Followup in the future with me in 3 months or call earlier if needed      Thank you for coming to see at [facility]. I hope we have been able to provide you high quality care today.  You may receive a patient satisfaction survey over the next few weeks. We would appreciate your feedback and comments so that we may continue to improve ourselves and the health of our patients.

## 2020-12-30 ENCOUNTER — Telehealth: Payer: Self-pay

## 2020-12-30 NOTE — Progress Notes (Signed)
Remote ICD transmission.   

## 2020-12-30 NOTE — Telephone Encounter (Signed)
Remote ICM transmission received.  Attempted call to daughter Kaelon Weekes regarding ICM remote transmission and left message per DPR to return call.

## 2020-12-30 NOTE — Progress Notes (Signed)
I agree with the above plan 

## 2020-12-30 NOTE — Progress Notes (Signed)
EPIC Encounter for ICM Monitoring  Patient Name: Gary [name] is a 70 y.o. male Date: 12/30/2020 Primary Care Physican: Jason Coop, FNP Primary Cardiologist:Cooper/Weaver PA Electrophysiologist: Allred 10/11/2021Weight: 119lbs  Attempted call to daughter Domanick Cuccia per DPR and unable to reach.  Left message to return call. Transmission reviewed. .  Per Epic notes, patient hospitalized 10/18/20 for stroke.  He currently is a resident of [facility]  Per Epic 12/29/20 Neuro note, he has residual left hemiplegia, dysarthria cognitive impairment and dysphagia.   Corvue thoracic impedancesuggesting possible fluid accumulation from 1/9 - 1/27 and 2/7 - 2/20.  Impedance returned to baseline 12/28/2020.  No diuretic prescribed.  Recommendations:Unable to reach.     Next ICM clinic phone appointment due:01/11/2021 to recheck fluid levels.Next 91 day remote transmission due:03/24/2021.  EP/Cardiology Office Visits:01/27/2021 with Dr Johney Frame.  Copy of ICM check sent to Dr.Allred.  3 month ICM trend: 12/28/2020.    1 Year ICM trend:       Karie Soda, RN 12/30/2020 9:29 AM

## 2021-01-11 ENCOUNTER — Ambulatory Visit (INDEPENDENT_AMBULATORY_CARE_PROVIDER_SITE_OTHER): Payer: Medicare Other

## 2021-01-11 DIAGNOSIS — Z95810 Presence of automatic (implantable) cardiac defibrillator: Secondary | ICD-10-CM | POA: Diagnosis not present

## 2021-01-11 DIAGNOSIS — I5022 Chronic systolic (congestive) heart failure: Secondary | ICD-10-CM

## 2021-01-13 NOTE — Progress Notes (Signed)
EPIC Encounter for ICM Monitoring  Patient Name: Gary [name] is a 70 y.o. male Date: 01/13/2021 Primary Care Physican: Jason Coop, FNP Primary Cardiologist:Cooper/Weaver PA Electrophysiologist: Allred LastWeight: [phone]  Spoke with daughter [name] per DPR. She reports he had a massive stroke and currently is a resident of [facility]. She is in discussion with patient about if patient wants to be DNR and turn off the defibrillator.  She will call back if the decision is to make him DNR  Corvue thoracic impedancesuggesting fluid levels returned to normal.  Decreaesed impedance correlates with patient's hospitalization for stroke.  No diuretic prescribed.  Recommendations: No changes.  He is currently in SNF  Next ICM clinic phone appointment due:02/15/2021.Next 91 day remote transmission due:03/24/2021.  EP/Cardiology Office Visits:01/27/2021 with Dr Johney Frame.  Copy of ICM check sent to Dr.Allred.  3 month ICM trend: 01/11/2021.    1 Year ICM trend:       Karie Soda, RN 01/13/2021 1:26 PM

## 2021-01-17 IMAGING — CT CT HEAD W/O CM
4 series · 16 of 47 positions shown, 18 images · non-contrast
Comparison: Head CT 10/18/2020.

CLINICAL DATA: 69-year-old male found down. Right MCA infarct.
Atrial fibrillation.

EXAM:
CT HEAD WITHOUT CONTRAST
TECHNIQUE: Contiguous axial images were obtained from the base of the skull
through the vertex without intravenous contrast.

[Series 3: head without · axial · non-contrast · 0.42mm/px · z∈[-118,+8]mm · 7 of 35 slices shown, 9 images]
[im 5/35  brain]
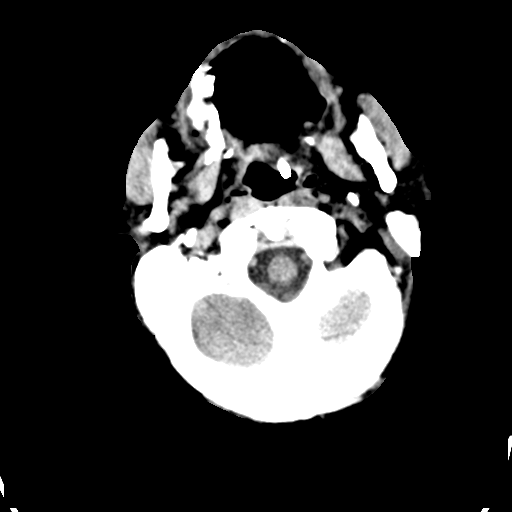
[im 5/35  bone]
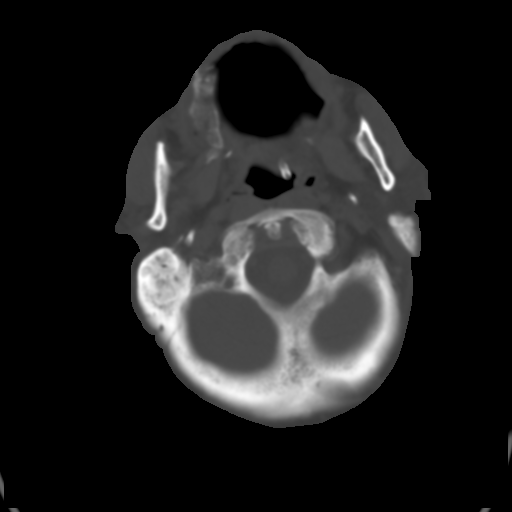
[im 9/35  brain]
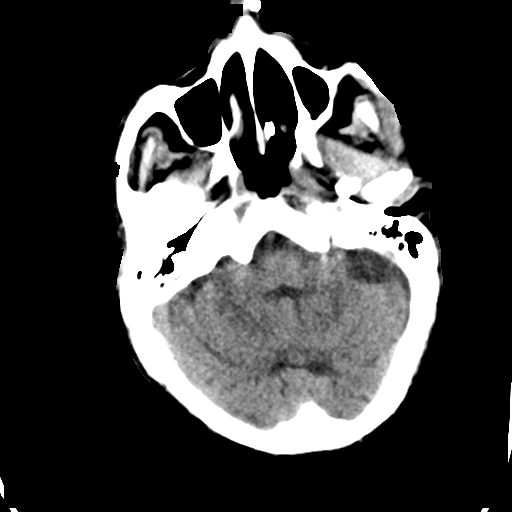
[im 13/35  brain]
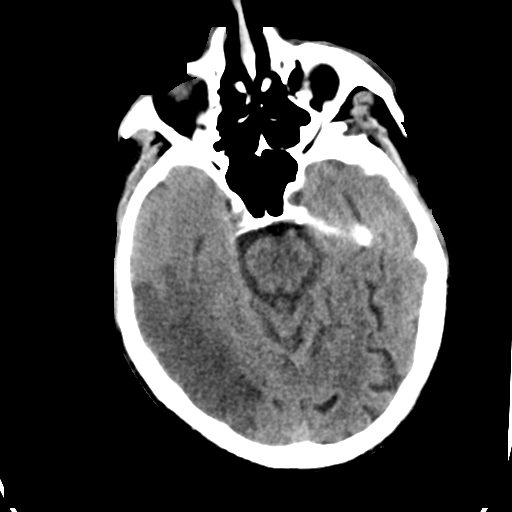
[im 18/35  brain]
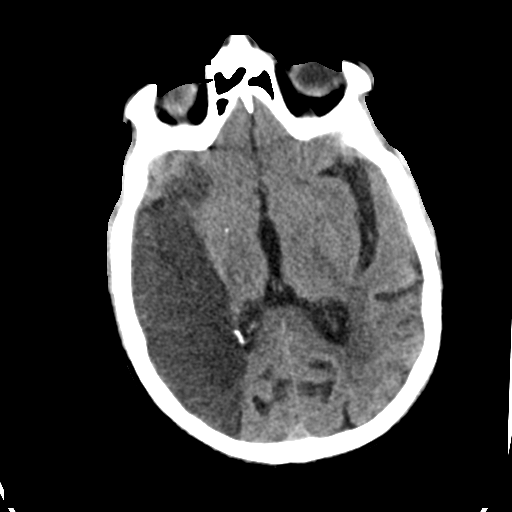
[im 22/35  brain]
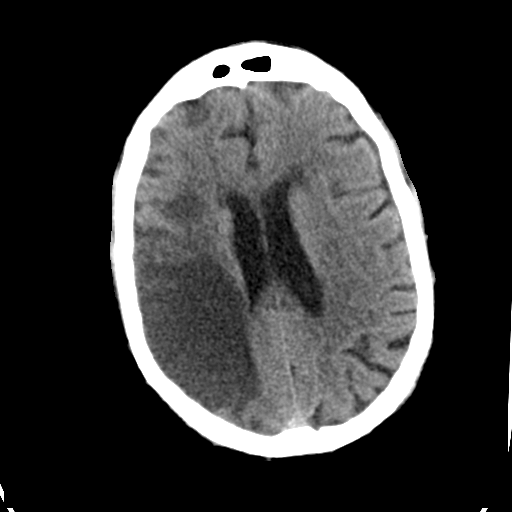
[im 22/35  bone]
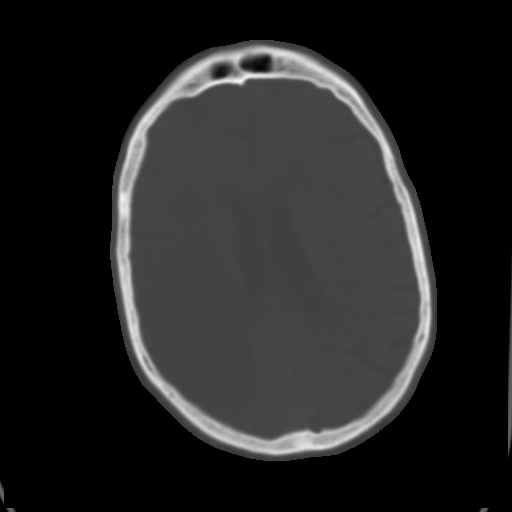
[im 26/35  brain]
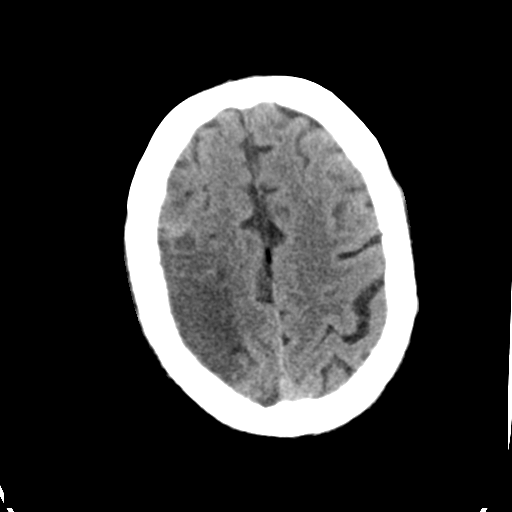
[im 30/35  brain]
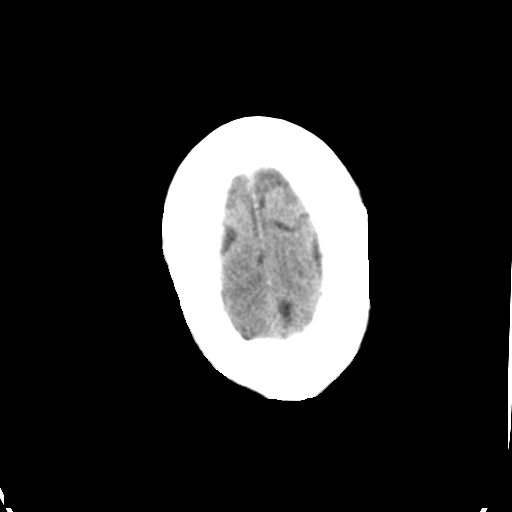

[Series 4: head bone · axial · 0.42mm/px · z∈[-122,-88]mm · 3 of 87 slices shown]
[im 9/87  bone]
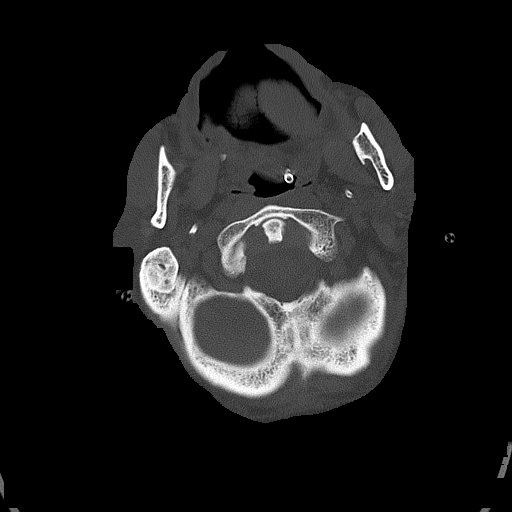
[im 18/87  bone]
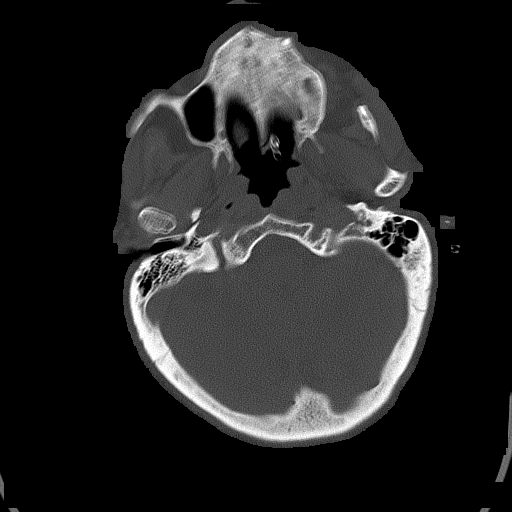
[im 26/87  bone]
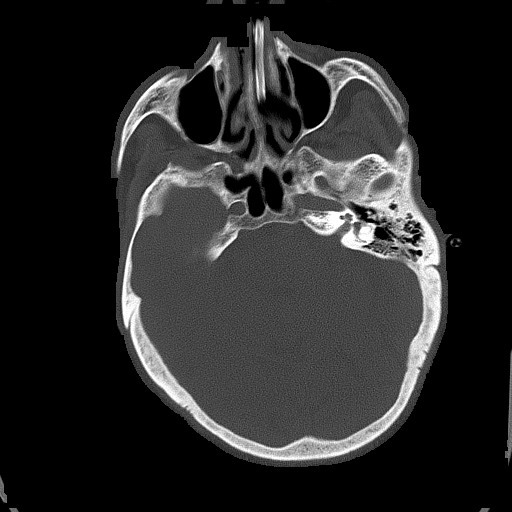

[Series 5: head without cor · coronal · non-contrast · 0.34mm/px · 3 of 71 slices shown]
[im 24/71  brain]
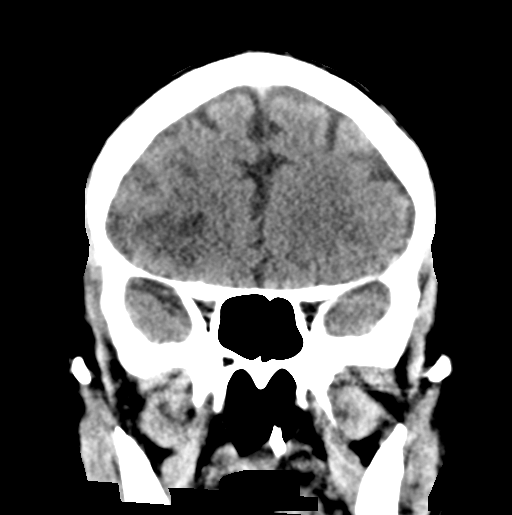
[im 32/71  brain]
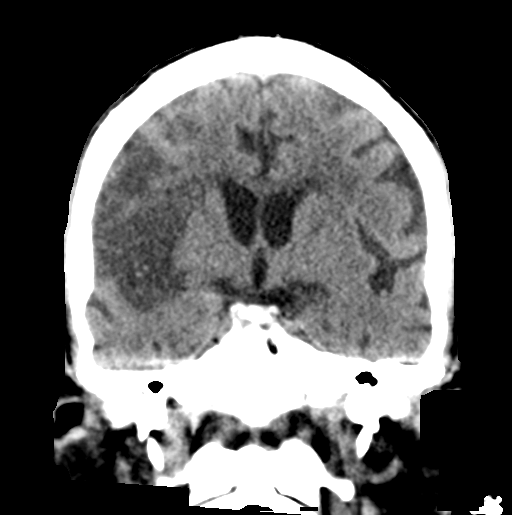
[im 39/71  brain]
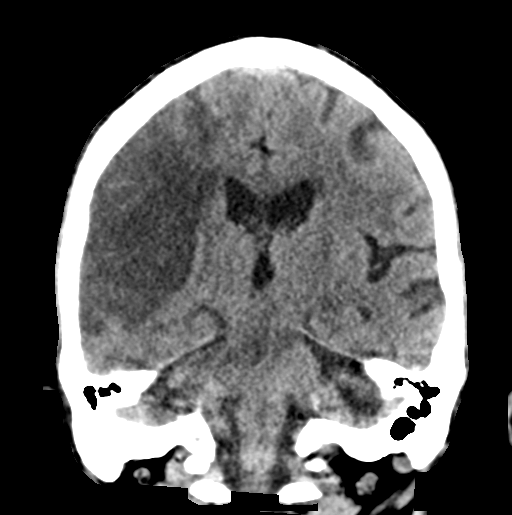

[Series 6: head without sag · sagittal · non-contrast · 0.35mm/px · 3 of 55 slices shown]
[im 19/55  brain]
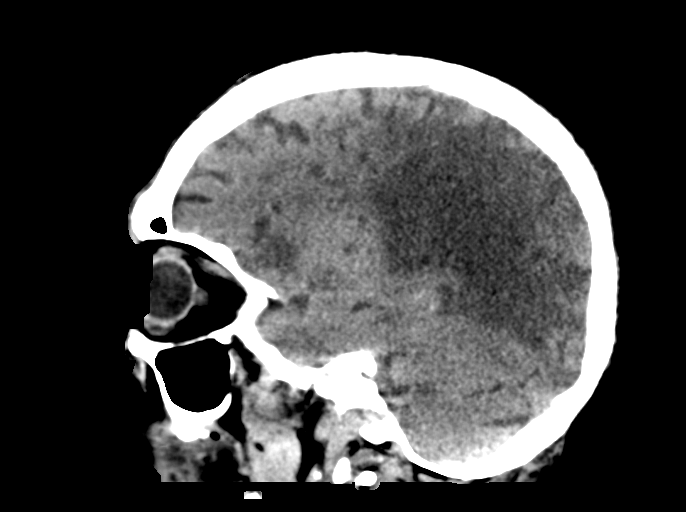
[im 28/55  brain]
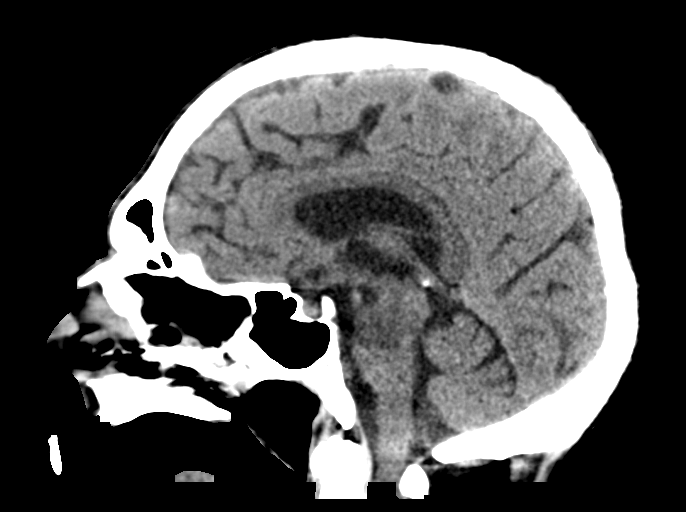
[im 37/55  brain]
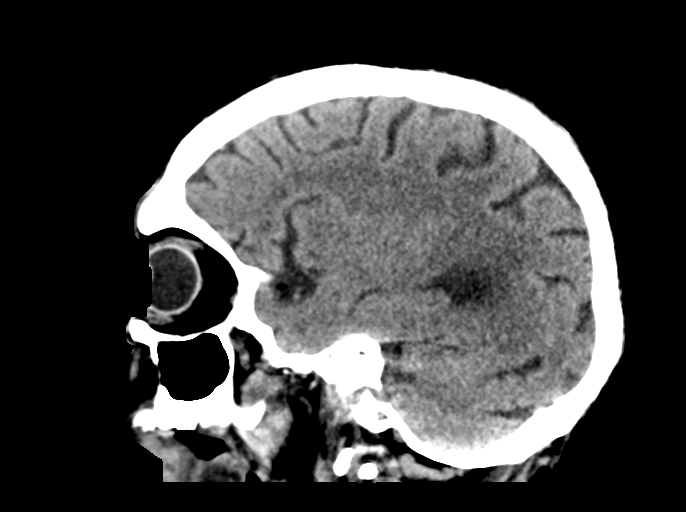

[16 of 47 positions shown; findings below may reference images not displayed]

FINDINGS: Brain: Stable confluent cytotoxic edema throughout much of the right
MCA territory. Mild mass effect on the right lateral ventricle,
although no midline shift.

No hemorrhagic transformation. Stable gray-white matter
differentiation elsewhere. No ventriculomegaly. Basilar cisterns
remain normal.

Vascular: Calcified atherosclerosis at the skull base.

Skull: No acute osseous abnormality identified.

Sinuses/Orbits: Stable. Suggestion of a chronic mucocele in the
midline of the frontal sinuses series 4, image 48. Other Visualized
paranasal sinuses and mastoids are stable and well pneumatized.

Other: Visualized orbits and scalp soft tissues are within normal
limits.
IMPRESSION: 1. Stable subacute large Right MCA infarct with no hemorrhagic
transformation or significant mass effect.
2. No new intracranial abnormality.

## 2021-01-22 IMAGING — DX DG CHEST 1V PORT
1 series · 1 of 1 positions shown · non-contrast
Comparison: Chest x-ray 10/19/2020.

CLINICAL DATA: Hypoxia.

EXAM:
PORTABLE CHEST 1 VIEW

[chest ap]
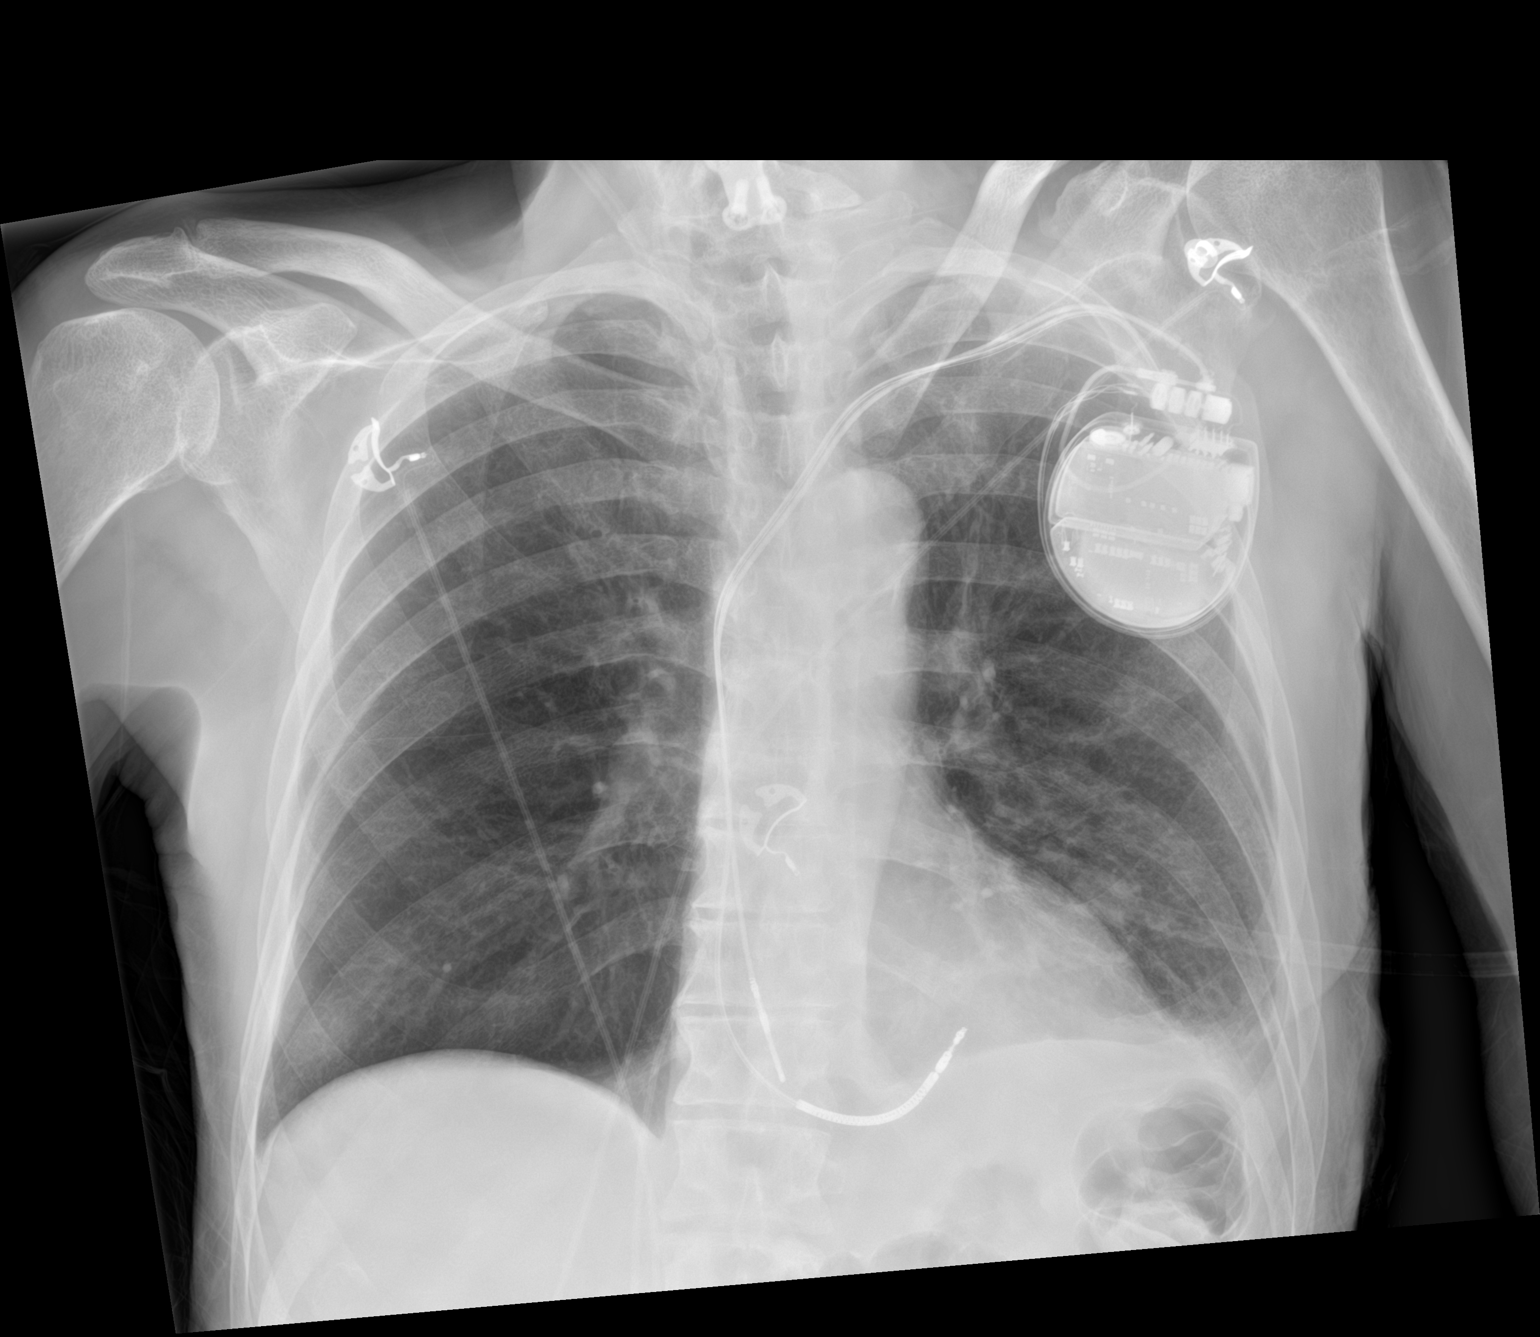

[1 of 1 positions shown; findings below may reference images not displayed]

FINDINGS: Cardiac pacer noted with lead tip over the right atrium and right
ventricle. Heart size normal. Mild left base infiltrate improved
from prior exam. Small left pleural effusion. No pneumothorax. Prior
cervical spine fusion.
IMPRESSION: 1. Cardiac pacer with lead tips over the right atrium and right
ventricle.
2. Mild left base infiltrate improved from prior exam. Small left
pleural effusion.

## 2021-01-27 ENCOUNTER — Encounter: Payer: Medicare Other | Admitting: Internal Medicine

## 2021-01-27 DIAGNOSIS — I480 Paroxysmal atrial fibrillation: Secondary | ICD-10-CM

## 2021-01-27 DIAGNOSIS — Z720 Tobacco use: Secondary | ICD-10-CM

## 2021-01-27 DIAGNOSIS — I5022 Chronic systolic (congestive) heart failure: Secondary | ICD-10-CM

## 2021-01-27 DIAGNOSIS — I10 Essential (primary) hypertension: Secondary | ICD-10-CM

## 2021-01-27 DIAGNOSIS — I472 Ventricular tachycardia: Secondary | ICD-10-CM

## 2021-02-15 ENCOUNTER — Ambulatory Visit (INDEPENDENT_AMBULATORY_CARE_PROVIDER_SITE_OTHER): Payer: Medicare Other

## 2021-02-15 DIAGNOSIS — Z95810 Presence of automatic (implantable) cardiac defibrillator: Secondary | ICD-10-CM | POA: Diagnosis not present

## 2021-02-15 DIAGNOSIS — I5022 Chronic systolic (congestive) heart failure: Secondary | ICD-10-CM

## 2021-02-16 ENCOUNTER — Telehealth: Payer: Self-pay

## 2021-02-16 NOTE — Telephone Encounter (Signed)
Spoke with daughter Creig Landin.  She reports patient has not entered hospice yet but patient is almost unresponsive.  Patient is under the care of the physician at [facility] in [place].  She is not sure patient will make it into hospice.  She is requesting defibrillator be turned off.  Forwarded to device clinic for follow up with Dr Johney Frame.

## 2021-02-16 NOTE — Telephone Encounter (Signed)
The patient Daughter told Randon Goldsmith that the patient is going to hospice and wants the defibrillator off. She asked me what to do. I told her the patient is an Allred patient and he is not here today. The hospice Doctor can fax [place] an order to turn off tachy therapies. Once we receive an order we can contact [employer] to get them to turn the therapies off. I gave her our fax number to give to the daughter.

## 2021-02-16 NOTE — Progress Notes (Signed)
EPIC Encounter for ICM Monitoring  Patient Name: Gary [name] is a 70 y.o. male Date: 02/16/2021 Primary Care Physican: Jason Coop, FNP Primary Cardiologist:Cooper/Weaver PA Electrophysiologist: Allred LastWeight: 119lbs  AT/AF Burden <1%  Spoke with daughter Gary [name] per DPR.  Pt had a 2nd stroke 2 weeks ago.  He is unresponsive and waiting for hospice referral.  She is not sure if he will live long enough to enroll in hospice.  She is requesting the defibrillator be turned off.  Message sent to device clinic to discuss with Dr Johney Frame to turn off defibrillator.  Corvue thoracic impedancesuggesting normal fluid levels.  No diuretic prescribed.  Recommendations:None  Next ICM clinic phone appointment due: No further ICM follow up.   EP/Cardiology Office Visits: None  Copy of ICM check sent to Dr.Allred.  3 month ICM trend: 02/15/2021.    1 Year ICM trend:       Karie Soda, RN 02/16/2021 9:02 AM

## 2021-02-17 NOTE — Telephone Encounter (Signed)
Per Dr Johney Frame have industry disable tachy therapies. Pam ( daughter) notified that arrangements will be made for industry rep to go to [facility] to turn off the Tachy therapies .  [employer] industry rep notified and facility and family contacted.  LMOM at [facility] SN[facility] to contact device clinic with # .

## 2021-02-19 NOTE — Telephone Encounter (Signed)
Message received from industry rep, tachy therapies have been disabled effective 02/17/21

## 2021-04-12 ENCOUNTER — Ambulatory Visit: Payer: Medicare Other | Admitting: Adult Health

## 2021-04-29 ENCOUNTER — Ambulatory Visit: Payer: Medicare Other | Admitting: Adult Health
